# Patient Record
Sex: Female | Born: 1995 | Race: Black or African American | Hispanic: No | Marital: Married | Smoking: Current some day smoker
Health system: Southern US, Community
[De-identification: ages and names within clinical notes are randomized; demographics above are authoritative.]

## PROBLEM LIST (undated history)

## (undated) ENCOUNTER — Inpatient Hospital Stay (HOSPITAL_COMMUNITY): Payer: Self-pay

## (undated) DIAGNOSIS — Z5189 Encounter for other specified aftercare: Secondary | ICD-10-CM

## (undated) DIAGNOSIS — A749 Chlamydial infection, unspecified: Secondary | ICD-10-CM

## (undated) DIAGNOSIS — O1495 Unspecified pre-eclampsia, complicating the puerperium: Secondary | ICD-10-CM

## (undated) DIAGNOSIS — Z8759 Personal history of other complications of pregnancy, childbirth and the puerperium: Secondary | ICD-10-CM

## (undated) DIAGNOSIS — B009 Herpesviral infection, unspecified: Secondary | ICD-10-CM

## (undated) HISTORY — PX: TONSILLECTOMY AND ADENOIDECTOMY: SUR1326

---
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
## 2014-01-03 ENCOUNTER — Encounter: Payer: Self-pay | Admitting: *Deleted

## 2014-01-04 ENCOUNTER — Encounter: Payer: Self-pay | Admitting: Obstetrics & Gynecology

## 2014-02-04 ENCOUNTER — Encounter (HOSPITAL_COMMUNITY): Payer: Self-pay | Admitting: Emergency Medicine

## 2014-02-04 ENCOUNTER — Emergency Department (HOSPITAL_COMMUNITY)
Admission: EM | Admit: 2014-02-04 | Discharge: 2014-02-04 | Disposition: A | Discharge status: Home / Self Care | Attending: Emergency Medicine | Admitting: Emergency Medicine

## 2014-02-04 DIAGNOSIS — Z8619 Personal history of other infectious and parasitic diseases: Secondary | ICD-10-CM | POA: Diagnosis not present

## 2014-02-04 DIAGNOSIS — Z791 Long term (current) use of non-steroidal anti-inflammatories (NSAID): Secondary | ICD-10-CM | POA: Insufficient documentation

## 2014-02-04 DIAGNOSIS — Z793 Long term (current) use of hormonal contraceptives: Secondary | ICD-10-CM | POA: Insufficient documentation

## 2014-02-04 DIAGNOSIS — Z880 Allergy status to penicillin: Secondary | ICD-10-CM | POA: Insufficient documentation

## 2014-02-04 DIAGNOSIS — Y9289 Other specified places as the place of occurrence of the external cause: Secondary | ICD-10-CM | POA: Insufficient documentation

## 2014-02-04 DIAGNOSIS — J45909 Unspecified asthma, uncomplicated: Secondary | ICD-10-CM | POA: Insufficient documentation

## 2014-02-04 DIAGNOSIS — Z87891 Personal history of nicotine dependence: Secondary | ICD-10-CM | POA: Insufficient documentation

## 2014-02-04 DIAGNOSIS — Z7951 Long term (current) use of inhaled steroids: Secondary | ICD-10-CM | POA: Diagnosis not present

## 2014-02-04 DIAGNOSIS — S0993XA Unspecified injury of face, initial encounter: Secondary | ICD-10-CM | POA: Diagnosis not present

## 2014-02-04 DIAGNOSIS — Y9389 Activity, other specified: Secondary | ICD-10-CM | POA: Diagnosis not present

## 2014-02-04 DIAGNOSIS — X58XXXA Exposure to other specified factors, initial encounter: Secondary | ICD-10-CM | POA: Insufficient documentation

## 2014-02-04 DIAGNOSIS — Y998 Other external cause status: Secondary | ICD-10-CM | POA: Insufficient documentation

## 2014-02-04 NOTE — ED Provider Notes (Signed)
CSN: [id number]     Arrival date & time 02/04/14  0753 History   First MD Initiated Contact with Patient 02/04/14 [phone]     Chief Complaint  Patient presents with  . Mouth Injury     (Consider location/radiation/quality/duration/timing/severity/associated sxs/prior Treatment) The history is provided by the patient. No language interpreter was used.  Cindy Pearson is a 19 year old female with past medical history of herpes, chlamydia, asthma presenting to the emergency department with wounds to the inner cheek that occurred approximately 2 days ago. Patient reported that she was playing with her friends and that she almost was about to fall, but broke her fall by left cheek with her hand. Stated that when she did this it resulted in wounds to her inner aspect of her cheek. Reported that she had little cuts there that are now hurting. Stated that they sting when she touches her wounds with her tongue. Stated that she has been gargling with warm water and salt. Stated that she has increased pain especially when swallowing and when her tongue touches the cheek. Denied head injury, loss of conscious, bleeding, pus drainage, neck pain, neck stiffness, dental damage. PCP [facility]  Past Medical History  Diagnosis Date  . Herpes     last outbreak 3 months ago  . Chlamydia 2012    hasn't picked up rx, not treated yet  . Asthma     Albuterol INH prn   Past Surgical History  Procedure Laterality Date  . Cesarean section    . Tonsillectomy and adenoidectomy     Family History  Problem Relation Age of Onset  . Diabetes Maternal Grandmother   . Heart disease Maternal Grandfather   . Heart disease Paternal Grandfather    History  Substance Use Topics  . Smoking status: Former Games developer  . Smokeless tobacco: Never Used  . Alcohol Use: No   OB History    Gravida Para Term Preterm AB TAB SAB Ectopic Multiple Living   0 1 0 1 0 0 2     Review of Systems  Constitutional: Negative for  fever and chills.  HENT: Positive for mouth sores. Negative for dental problem, facial swelling, sore throat and trouble swallowing.   Musculoskeletal: Negative for neck pain and neck stiffness.      Allergies  Penicillins  Home Medications   Prior to Admission medications   Medication Sig Start Date End Date Taking? Authorizing Provider  albuterol (PROVENTIL HFA;VENTOLIN HFA) 108 (90 BASE) MCG/ACT inhaler Inhale 2 puffs into the lungs every 6 (six) hours as needed for wheezing or shortness of breath.    Historical Provider, MD  beclomethasone (QVAR) 80 MCG/ACT inhaler Inhale 2 puffs into the lungs 2 (two) times daily.    Historical Provider, MD  cyclobenzaprine (FLEXERIL) 10 MG tablet Take 1 tablet (10 mg total) by mouth 2 (two) times daily as needed for muscle spasms. 12/22/13   Harle Battiest, NP  ibuprofen (ADVIL,MOTRIN) 800 MG tablet Take 800 mg by mouth every 6 (six) hours as needed for pain.    Historical Provider, MD  medroxyPROGESTERone (DEPO-PROVERA) 150 MG/ML injection Inject 1 mL (150 mg total) into the muscle once. 10/07/13   Antionette Char, MD  naproxen (NAPROSYN) 500 MG tablet Take 1 tablet (500 mg total) by mouth 2 (two) times daily with a meal. 12/22/13   Harle Battiest, NP   BP 112/65 mmHg  Pulse 88  Temp(Src) 97.9 F (36.6 C) (Oral)  Resp 20  Ht  5\' 1"  (1.549 m)  Wt 160 lb (72.576 kg)  BMI 30.25 kg/m2  SpO2 98% Physical Exam  Constitutional: She is oriented to person, place, and time. She appears well-developed and well-nourished. No distress.  HENT:  Head: Normocephalic and atraumatic.  Negative facial swelling 2 areas of circular healing wounds identified to the upper aspect of the left buccal mucosa near the left maxillary canine and one circular healing wound identified to the left buccal mucosa near the mandibular left second premolar. Negative active drainage or bleeding noted. Negative swelling. Mild erythema identified surrounding the  area-appears to be halo in nature. Negative trismus.  Eyes: Conjunctivae and EOM are normal. Pupils are equal, round, and reactive to light. Right eye exhibits no discharge. Left eye exhibits no discharge.  Neck: Normal range of motion. Neck supple. No tracheal deviation present.  Negative neck stiffness Negative nuchal rigidity  Negative cervical lymphadenopathy   Cardiovascular: Normal rate, regular rhythm and normal heart sounds.  Exam reveals no friction rub.   No murmur heard. Pulses:      Radial pulses are 2+ on the right side, and 2+ on the left side.  Pulmonary/Chest: Effort normal and breath sounds normal. No respiratory distress. She has no wheezes. She has no rales.  Patient is able to speak in full sentences without difficulty  Negative use of accessory muscles Negative stridor  Musculoskeletal: Normal range of motion.  Lymphadenopathy:    She has no cervical adenopathy.  Neurological: She is alert and oriented to person, place, and time. No cranial nerve deficit. She exhibits normal muscle tone. Coordination normal.  Skin: Skin is warm and dry. No rash noted. She is not diaphoretic. No erythema.  Psychiatric: She has a normal mood and affect. Her behavior is normal. Thought content normal.  Nursing note and vitals reviewed.   ED Course  Procedures (including critical care time) Labs Review Labs Reviewed - No data to display  Imaging Review No results found.   EKG Interpretation None      MDM   Final diagnoses:  Mouth injury, initial encounter    Medications - No data to display Filed Vitals:   02/04/14 0757 02/04/14 0803  BP: 112/65   Pulse: 88   Temp: 97.9 F (36.6 C)   TempSrc: Oral   Resp: 20   Height:  5\' 1"  (1.549 m)  Weight:  160 lb (72.576 kg)  SpO2: 98%    Patient presenting to emergency department with healed over wounds to the buccal mucosa the on the left side that occurred approximately 2 days ago. This appears to be bite marks that now  healed over when patient fell. Negative signs of infection-wounds appeared to be healing well. Denied head injury or loss of consciousness. Negative focal neurological deficits. Negative active drainage or bleeding noted to the site. Patient stable, afebrile. Patient not septic appearing. Discharged patient. Discharged patient with recommendation to warm water and salt. Discussed with patient to keep mouth clean. Discussed with patient to continue to apply ice. Referred patient to PCP. Discussed with patient to closely monitor symptoms and if symptoms are to worsen or change to report back to the ED - strict return instructions given.  Patient agreed to plan of care, understood, all questions answered.   Raymon MuttonMarissa Nancey Kreitz, PA-C 02/04/14 0857  Raymon MuttonMarissa Xochilth Standish, PA-C 02/04/14 0900  Gilda Creasehristopher J. Pollina, MD 02/04/14 [phone]

## 2014-02-04 NOTE — ED Notes (Signed)
Patient states she bit the inside of her L cheek x 2 days ago and "have holes in it now".   Patient states no bleeding, but she has swelling from the bite on the inside of the mouth.  Patient denies other symptoms.

## 2014-02-04 NOTE — Discharge Instructions (Signed)
Please call your doctor for a followup appointment within 24-48 hours. When you talk to your doctor please let them know that you were seen in the emergency department and have them acquire all of your records so that they can discuss the findings with you and formulate a treatment plan to fully care for your new and ongoing problems. Please follow-up with her primary care provider Please gargle with warm water and salt or diluted mouthwash Please keep teeth cleaned by flossing and brushing daily Please apply ice Please continue to monitor symptoms closely and if symptoms are to worsen or change (fever greater than 101, chills, sweating, nausea, vomiting, chest pain, shortness of breathe, difficulty breathing, weakness, numbness, tingling, worsening or changes to pain pattern, swelling, drainage, increased pain, inability to swallow, inability to open and close the jaw line) please report back to the Emergency Department immediately.   Mouth Laceration A mouth laceration is a cut inside the mouth.  HOME CARE  Rinse your mouth with warm salt water 4 to 6 times a day.  Brush your teeth as usual if you can.  Do not eat hot food or have hot drinks while your mouth is still numb.  Avoid acidic foods or other foods that bother your cut.  Only take medicine as told by your doctor.  Keep all doctor visits as told.  If there are stitches (sutures) in the mouth, do not play with them with your tongue. You may need a tetanus shot if:  You cannot remember when you had your last tetanus shot.  You have never had a tetanus shot. If you need a tetanus shot and you choose not to have one, you may get tetanus. Sickness from tetanus can be serious. GET HELP RIGHT AWAY IF:   Your cut or other parts of your face are puffy (swollen) or painful.  You have a fever.  Your throat is puffy or tender.  Your cut breaks open after stitches have been removed.  You see yellowish-white fluid (pus) coming  from the cut. MAKE SURE YOU:   Understand these instructions.  Will watch your condition.  Will get help right away if you are not doing well or get worse. Document Released: 06/12/2007 Document Revised: 05/10/2013 Document Reviewed: 06/28/2010 [facility] Patient Information 2015 [place], [place]. This information is not intended to replace advice given to you by your health care provider. Make sure you discuss any questions you have with your health care provider.

## 2014-03-09 ENCOUNTER — Ambulatory Visit: Admitting: Obstetrics

## 2014-07-29 ENCOUNTER — Emergency Department (HOSPITAL_COMMUNITY)
Admission: EM | Admit: 2014-07-29 | Discharge: 2014-07-29 | Disposition: A | Discharge status: Home / Self Care | Attending: Emergency Medicine | Admitting: Emergency Medicine

## 2014-07-29 ENCOUNTER — Encounter (HOSPITAL_COMMUNITY): Payer: Self-pay | Admitting: Family Medicine

## 2014-07-29 DIAGNOSIS — L02211 Cutaneous abscess of abdominal wall: Secondary | ICD-10-CM | POA: Diagnosis not present

## 2014-07-29 DIAGNOSIS — Z7951 Long term (current) use of inhaled steroids: Secondary | ICD-10-CM | POA: Insufficient documentation

## 2014-07-29 DIAGNOSIS — L0291 Cutaneous abscess, unspecified: Secondary | ICD-10-CM

## 2014-07-29 DIAGNOSIS — Z793 Long term (current) use of hormonal contraceptives: Secondary | ICD-10-CM | POA: Diagnosis not present

## 2014-07-29 DIAGNOSIS — J45909 Unspecified asthma, uncomplicated: Secondary | ICD-10-CM | POA: Diagnosis not present

## 2014-07-29 DIAGNOSIS — Z8619 Personal history of other infectious and parasitic diseases: Secondary | ICD-10-CM | POA: Insufficient documentation

## 2014-07-29 DIAGNOSIS — Z87891 Personal history of nicotine dependence: Secondary | ICD-10-CM | POA: Diagnosis not present

## 2014-07-29 DIAGNOSIS — Z79899 Other long term (current) drug therapy: Secondary | ICD-10-CM | POA: Insufficient documentation

## 2014-07-29 DIAGNOSIS — Z880 Allergy status to penicillin: Secondary | ICD-10-CM | POA: Diagnosis not present

## 2014-07-29 MED ORDER — NAPROXEN 500 MG PO TBEC: 500.0000 mg | DELAYED_RELEASE_TABLET | Freq: Two times a day (BID) | ORAL | Status: DC

## 2014-07-29 MED ORDER — SULFAMETHOXAZOLE-TRIMETHOPRIM 800-160 MG PO TABS
1.0000 | ORAL_TABLET | Freq: Two times a day (BID) | ORAL | Status: AC
Start: 2014-07-29 — End: 2014-08-05

## 2014-07-29 NOTE — ED Notes (Signed)
Declined W/C at D/C and was escorted to lobby by RN. 

## 2014-07-29 NOTE — Discharge Instructions (Signed)
Take the prescribed medication as directed. °Follow-up with your primary care physician. °Return to the ED for new or worsening symptoms. ° °

## 2014-07-29 NOTE — ED Notes (Signed)
Pt here for abd pain and possible MRSA infection. sts daughter was dx with MRSA. sts pain is where the abscess is.

## 2014-07-29 NOTE — ED Provider Notes (Signed)
CSN: [id number]     Arrival date & time 07/29/14  1015 History  This chart was scribed for non-physician practitioner, Garlon Hatchet, PA-C working with Doug Sou, MD by Placido Sou, ED scribe. This patient was seen in room TR08C/TR08C and the patient's care was started at 10:43 AM.  Chief Complaint  Patient presents with  . Abscess   The history is provided by the patient. No language interpreter was used.    HPI Comments: Cindy Pearson is a 19 y.o. female, with a history of herpes, who presents to the Emergency Department complaining of two points of swelling to her lower abd and right abd with onset a few days ago. Pt notes associated pain and irritation to the affected regions. She notes a hx of abscesses but never to her abd region. She further notes that her daughter was recently diagnosed with MRSA. Pt denies fever or chills.   No hx of HIV, DM.  Past Medical History  Diagnosis Date  . Herpes     last outbreak 3 months ago  . Chlamydia 2012    hasn't picked up rx, not treated yet  . Asthma     Albuterol INH prn   Past Surgical History  Procedure Laterality Date  . Cesarean section    . Tonsillectomy and adenoidectomy     Family History  Problem Relation Age of Onset  . Diabetes Maternal Grandmother   . Heart disease Maternal Grandfather   . Heart disease Paternal Grandfather    History  Substance Use Topics  . Smoking status: Former Games developer  . Smokeless tobacco: Never Used  . Alcohol Use: No   OB History    Gravida Para Term Preterm AB TAB SAB Ectopic Multiple Living   3 2 2  0 1 0 1 0 0 2     Review of Systems  Constitutional: Negative for fever and chills.  Gastrointestinal: Positive for abdominal pain (abscess).  All other systems reviewed and are negative.   Allergies  Penicillins  Home Medications   Prior to Admission medications   Medication Sig Start Date End Date Taking? Authorizing Provider  albuterol (PROVENTIL HFA;VENTOLIN HFA) 108 (90  BASE) MCG/ACT inhaler Inhale 2 puffs into the lungs every 6 (six) hours as needed for wheezing or shortness of breath.    Historical Provider, MD  beclomethasone (QVAR) 80 MCG/ACT inhaler Inhale 2 puffs into the lungs 2 (two) times daily.    Historical Provider, MD  cyclobenzaprine (FLEXERIL) 10 MG tablet Take 1 tablet (10 mg total) by mouth 2 (two) times daily as needed for muscle spasms. 12/22/13   Harle Battiest, NP  ibuprofen (ADVIL,MOTRIN) 800 MG tablet Take 800 mg by mouth every 6 (six) hours as needed for pain.    Historical Provider, MD  medroxyPROGESTERone (DEPO-PROVERA) 150 MG/ML injection Inject 1 mL (150 mg total) into the muscle once. 10/07/13   Antionette Char, MD  naproxen (NAPROSYN) 500 MG tablet Take 1 tablet (500 mg total) by mouth 2 (two) times daily with a meal. 12/22/13   Harle Battiest, NP   BP 108/65 mmHg  Pulse 78  Temp(Src) 98 F (36.7 C) (Oral)  Resp 16  SpO2 98%   Physical Exam  Constitutional: She is oriented to person, place, and time. She appears well-developed and well-nourished. No distress.  HENT:  Head: Normocephalic and atraumatic.  Mouth/Throat: Oropharynx is clear and moist.  Eyes: Conjunctivae and EOM are normal. Pupils are equal, round, and reactive to light.  Neck: Normal  range of motion. Neck supple.  Cardiovascular: Normal rate, regular rhythm and normal heart sounds.   Pulmonary/Chest: Effort normal and breath sounds normal. No respiratory distress. She has no wheezes.  Abdominal: Soft. Bowel sounds are normal.  2 small boils noted to right lateral abdomen and just below navel; no central fluctuance or drainage; no surrounding cellulitis or induration  Musculoskeletal: Normal range of motion.  Neurological: She is alert and oriented to person, place, and time.  Skin: Skin is warm and dry. She is not diaphoretic.  Psychiatric: She has a normal mood and affect.  Nursing note and vitals reviewed.  ED Course  Procedures  DIAGNOSTIC  STUDIES: Oxygen Saturation is 98% on RA, normal by my interpretation.    COORDINATION OF CARE: 10:45 AM Discussed treatment plan with pt at bedside and pt agreed to plan.  Labs Review Labs Reviewed - No data to display  Imaging Review No results found.   EKG Interpretation None     MDM   Final diagnoses:  Abscess   19 year old female with 2 small boils, one of right lateral abdomen and the other of lower abdomen just below navel. There is no central fluctuance or drainage. No significant cellulitis or induration noted. Vital signs stable, patient nontoxic in appearance. Abscesses do not appear amenable to drainage at this time. Will start on antibiotics, encouraged warm compresses. Patient is to follow-up with her PCP.  Discussed plan with patient, he/she acknowledged understanding and agreed with plan of care.  Return precautions given for new or worsening symptoms.  I personally performed the services described in this documentation, which was scribed in my presence. The recorded information has been reviewed and is accurate.  Garlon Hatchet, PA-C 07/29/14 1204  Doug Sou, MD 07/29/14 1538

## 2014-12-29 ENCOUNTER — Emergency Department (HOSPITAL_COMMUNITY)
Admission: EM | Admit: 2014-12-29 | Discharge: 2014-12-29 | Disposition: A | Discharge status: Home / Self Care | Attending: Emergency Medicine | Admitting: Emergency Medicine

## 2014-12-29 ENCOUNTER — Encounter (HOSPITAL_COMMUNITY): Payer: Self-pay

## 2014-12-29 DIAGNOSIS — L0291 Cutaneous abscess, unspecified: Secondary | ICD-10-CM

## 2014-12-29 DIAGNOSIS — Z880 Allergy status to penicillin: Secondary | ICD-10-CM | POA: Diagnosis not present

## 2014-12-29 DIAGNOSIS — L03113 Cellulitis of right upper limb: Secondary | ICD-10-CM | POA: Diagnosis not present

## 2014-12-29 DIAGNOSIS — J45909 Unspecified asthma, uncomplicated: Secondary | ICD-10-CM | POA: Diagnosis not present

## 2014-12-29 DIAGNOSIS — Z79899 Other long term (current) drug therapy: Secondary | ICD-10-CM | POA: Diagnosis not present

## 2014-12-29 DIAGNOSIS — Z87891 Personal history of nicotine dependence: Secondary | ICD-10-CM | POA: Insufficient documentation

## 2014-12-29 DIAGNOSIS — Z7951 Long term (current) use of inhaled steroids: Secondary | ICD-10-CM | POA: Insufficient documentation

## 2014-12-29 DIAGNOSIS — Z8619 Personal history of other infectious and parasitic diseases: Secondary | ICD-10-CM | POA: Insufficient documentation

## 2014-12-29 DIAGNOSIS — L02413 Cutaneous abscess of right upper limb: Secondary | ICD-10-CM | POA: Diagnosis present

## 2014-12-29 DIAGNOSIS — L0390 Cellulitis, unspecified: Secondary | ICD-10-CM

## 2014-12-29 DIAGNOSIS — Z791 Long term (current) use of non-steroidal anti-inflammatories (NSAID): Secondary | ICD-10-CM | POA: Insufficient documentation

## 2014-12-29 LAB — CBG MONITORING, ED: Glucose-Capillary: 67 mg/dL (ref 65–99)

## 2014-12-29 MED ORDER — IBUPROFEN 800 MG PO TABS: 800.0000 mg | ORAL_TABLET | Freq: Three times a day (TID) | ORAL | Status: DC

## 2014-12-29 MED ORDER — LIDOCAINE HCL (PF) 1 % IJ SOLN
5.0000 mL | Freq: Once | INTRAMUSCULAR | Status: AC
  Administered 2014-12-29: 5 mL via INTRADERMAL
  Filled 2014-12-29: qty 5

## 2014-12-29 MED ORDER — IBUPROFEN 400 MG PO TABS
800.0000 mg | ORAL_TABLET | Freq: Once | ORAL | Status: AC
  Administered 2014-12-29: 800 mg via ORAL
  Filled 2014-12-29: qty 2

## 2014-12-29 MED ORDER — CLINDAMYCIN HCL 150 MG PO CAPS: 450.0000 mg | ORAL_CAPSULE | Freq: Three times a day (TID) | ORAL | Status: DC

## 2014-12-29 NOTE — ED Notes (Signed)
Agree with CPA assessment. 

## 2014-12-29 NOTE — Discharge Instructions (Signed)
Abscess °An abscess is an infected area that contains a collection of pus and debris. It can occur in almost any part of the body. An abscess is also known as a furuncle or boil. °CAUSES  °An abscess occurs when tissue gets infected. This can occur from blockage of oil or sweat glands, infection of hair follicles, or a minor injury to the skin. As the body tries to fight the infection, pus collects in the area and creates pressure under the skin. This pressure causes pain. People with weakened immune systems have difficulty fighting infections and get certain abscesses more often.  °SYMPTOMS °Usually an abscess develops on the skin and becomes a painful mass that is red, warm, and tender. If the abscess forms under the skin, you may feel a moveable soft area under the skin. Some abscesses break open (rupture) on their own, but most will continue to get worse without care. The infection can spread deeper into the body and eventually into the bloodstream, causing you to feel ill.  °DIAGNOSIS  °Your caregiver will take your medical history and perform a physical exam. A sample of fluid may also be taken from the abscess to determine what is causing your infection. °TREATMENT  °Your caregiver may prescribe antibiotic medicines to fight the infection. However, taking antibiotics alone usually does not cure an abscess. Your caregiver may need to make a small cut (incision) in the abscess to drain the pus. In some cases, gauze is packed into the abscess to reduce pain and to continue draining the area. °HOME CARE INSTRUCTIONS  °· Only take over-the-counter or prescription medicines for pain, discomfort, or fever as directed by your caregiver. °· If you were prescribed antibiotics, take them as directed. Finish them even if you start to feel better. °· If gauze is used, follow your caregiver's directions for changing the gauze. °· To avoid spreading the infection: °· Keep your draining abscess covered with a  bandage. °· Wash your hands well. °· Do not share personal care items, towels, or whirlpools with others. °· Avoid skin contact with others. °· Keep your skin and clothes clean around the abscess. °· Keep all follow-up appointments as directed by your caregiver. °SEEK MEDICAL CARE IF:  °· You have increased pain, swelling, redness, fluid drainage, or bleeding. °· You have muscle aches, chills, or a general ill feeling. °· You have a fever. °MAKE SURE YOU:  °· Understand these instructions. °· Will watch your condition. °· Will get help right away if you are not doing well or get worse. °  °This information is not intended to replace advice given to you by your health care provider. Make sure you discuss any questions you have with your health care provider. °  °Document Released: 10/03/2004 Document Revised: 06/25/2011 Document Reviewed: 03/08/2011 °Elsevier Interactive Patient Education ©2016 Else[employer] Inc. ° °Incision and Drainage °Incision and drainage is a procedure in which a sac-like structure (cystic structure) is opened and drained. The area to be drained usually contains material such as pus, fluid, or blood.  °LET YOUR CAREGIVER KNOW ABOUT:  °· Allergies to medicine. °· Medicines taken, including vitamins, herbs, eyedrops, over-the-counter medicines, and creams. °· Use of steroids (by mouth or creams). °· Previous problems with anesthetics or numbing medicines. °· History of bleeding problems or blood clots. °· Previous surgery. °· Other health problems, including diabetes and kidney problems. °· Possibility of pregnancy, if this applies. °RISKS AND COMPLICATIONS °· Pain. °· Bleeding. °· Scarring. °· Infection. °BEFORE THE PROCEDURE  °  You may need to have an ultrasound or other imaging tests to see how large or deep your cystic structure is. Blood tests may also be used to determine if you have an infection or how severe the infection is. You may need to have a tetanus shot. PROCEDURE  The affected area  is cleaned with a cleaning fluid. The cyst area will then be numbed with a medicine (local anesthetic). A small incision will be made in the cystic structure. A syringe or catheter may be used to drain the contents of the cystic structure, or the contents may be squeezed out. The area will then be flushed with a cleansing solution. After cleansing the area, it is often gently packed with a gauze or another wound dressing. Once it is packed, it will be covered with gauze and tape or some other type of wound dressing. AFTER THE PROCEDURE   Often, you will be allowed to go home right after the procedure.  You may be given antibiotic medicine to prevent or heal an infection.  If the area was packed with gauze or some other wound dressing, you will likely need to come back in 1 to 2 days to get it removed.  The area should heal in about 14 days.   This information is not intended to replace advice given to you by your health care provider. Make sure you discuss any questions you have with your health care provider.   Document Released: 06/19/2000 Document Revised: 06/25/2011 Document Reviewed: 02/18/2011 Elsevier Interactive Patient Education 2016 Elsevier Inc.  Cellulitis Cellulitis is an infection of the skin and the tissue beneath it. The infected area is usually red and tender. Cellulitis occurs most often in the arms and lower legs.  CAUSES  Cellulitis is caused by bacteria that enter the skin through cracks or cuts in the skin. The most common types of bacteria that cause cellulitis are staphylococci and streptococci. SIGNS AND SYMPTOMS   Redness and warmth.  Swelling.  Tenderness or pain.  Fever. DIAGNOSIS  Your health care provider can usually determine what is wrong based on a physical exam. Blood tests may also be done. TREATMENT  Treatment usually involves taking an antibiotic medicine. HOME CARE INSTRUCTIONS   Take your antibiotic medicine as directed by your health care  provider. Finish the antibiotic even if you start to feel better.  Keep the infected arm or leg elevated to reduce swelling.  Apply a warm cloth to the affected area up to 4 times per day to relieve pain.  Take medicines only as directed by your health care provider.  Keep all follow-up visits as directed by your health care provider. SEEK MEDICAL CARE IF:   You notice red streaks coming from the infected area.  Your red area gets larger or turns dark in color.  Your bone or joint underneath the infected area becomes painful after the skin has healed.  Your infection returns in the same area or another area.  You notice a swollen bump in the infected area.  You develop new symptoms.  You have a fever. SEEK IMMEDIATE MEDICAL CARE IF:   You feel very sleepy.  You develop vomiting or diarrhea.  You have a general ill feeling (malaise) with muscle aches and pains.   This information is not intended to replace advice given to you by your health care provider. Make sure you discuss any questions you have with your health care provider.   Document Released: 10/03/2004 Document Revised: 09/14/2014 Document  Reviewed: 03/11/2011 Elsevier Interactive Patient Education [employer].

## 2014-12-29 NOTE — ED Notes (Signed)
Pt reports abscess to right elbow that has been present x1 week.  Pt reports small amounts of white drainage from area.  Pt reports pain upon palpation to abscess and surrounding areas.

## 2014-12-29 NOTE — ED Provider Notes (Signed)
CSN: [id number]     Arrival date & time 12/29/14  1545 History  By signing my name below, I, Cindy Pearson, attest that this documentation has been prepared under the direction and in the presence of [name], PA-C. Electronically Signed: Phillis Pearson, ED Scribe. 12/29/2014. 4:36 PM.  Chief Complaint  Patient presents with  . Abscess   The history is provided by the patient. No language interpreter was used.   HPI Comments: Cindy Pearson is a 19 y.o. female who presents to the Emergency Department complaining of a constant gradually worsening, moderately painful abscess near the right elbow onset one week ago. Pt reports 7/10 pain radiating around her elbow area with slight green drainage. She reports hx of abscesses on other parts of her body, but states that they have drained on their own. She reports worsening pain with palpation and an intermittent sharp, tingling, sensation. She denies taking anything for her symptoms. She denies hx of diabetes, fever, chills, nausea, vomiting, numbness, or weakness of the upper extremities. Pt is allergic to penicillin. She does not know the exact date of her last tdap, but states that she should not be due for another one.  Past Medical History  Diagnosis Date  . Herpes     last outbreak 3 months ago  . Chlamydia 2012    hasn't picked up rx, not treated yet  . Asthma     Albuterol INH prn   Past Surgical History  Procedure Laterality Date  . Cesarean section    . Tonsillectomy and adenoidectomy     Family History  Problem Relation Age of Onset  . Diabetes Maternal Grandmother   . Heart disease Maternal Grandfather   . Heart disease Paternal Grandfather    Social History  Substance Use Topics  . Smoking status: Former Games developer  . Smokeless tobacco: Never Used  . Alcohol Use: No   OB History    Gravida Para Term Preterm AB TAB SAB Ectopic Multiple Living   0 1 0 1 0 0 2     Review of Systems 10 Systems reviewed and all are  negative for acute change except as noted in the HPI.  Allergies  Penicillins  Home Medications   Prior to Admission medications   Medication Sig Start Date End Date Taking? Authorizing Provider  albuterol (PROVENTIL HFA;VENTOLIN HFA) 108 (90 BASE) MCG/ACT inhaler Inhale 2 puffs into the lungs every 6 (six) hours as needed for wheezing or shortness of breath.    Historical Provider, MD  beclomethasone (QVAR) 80 MCG/ACT inhaler Inhale 2 puffs into the lungs 2 (two) times daily.    Historical Provider, MD  clindamycin (CLEOCIN) 150 MG capsule Take 3 capsules (450 mg total) by mouth 3 (three) times daily. 12/29/14   [name], PA-C  cyclobenzaprine (FLEXERIL) 10 MG tablet Take 1 tablet (10 mg total) by mouth 2 (two) times daily as needed for muscle spasms. 12/22/13   Harle Battiest, NP  ibuprofen (ADVIL,MOTRIN) 800 MG tablet Take 1 tablet (800 mg total) by mouth 3 (three) times daily. 12/29/14   [name], PA-C  medroxyPROGESTERone (DEPO-PROVERA) 150 MG/ML injection Inject 1 mL (150 mg total) into the muscle once. 10/07/13   Antionette Char, MD  naproxen (EC-NAPROSYN) 500 MG EC tablet Take 1 tablet (500 mg total) by mouth 2 (two) times daily with a meal. 07/29/14   Garlon Hatchet, PA-C   BP 114/73 mmHg  Pulse 89  Temp(Src) 97.9 F (36.6 C) (Oral)  Resp 20  Ht 5\' 1"  (1.549 m)  Wt 61.236 kg  BMI 25.52 kg/m2  SpO2 99% Physical Exam  Constitutional: She is oriented to person, place, and time. She appears well-developed and well-nourished.  HENT:  Head: Atraumatic.  Eyes: Conjunctivae are normal. No scleral icterus.  Neck: No tracheal deviation present.  Pulmonary/Chest: Effort normal. No respiratory distress.  Musculoskeletal: Normal range of motion.  Neurological: She is alert and oriented to person, place, and time.  Skin: Skin is warm and dry. There is erythema.  1x1cm area with minimal fluctuance with medium area of surrounding erythema, warmth, and induration.  No active  drainage.  Area is swollen.  Psychiatric: She has a normal mood and affect. Her behavior is normal.    ED Course  Procedures (including critical care time) DIAGNOSTIC STUDIES: Oxygen Saturation is 99% on RA, normal by my interpretation.    COORDINATION OF CARE: 4:34 PM-Discussed treatment plan which includes I&D and anti-biotics with pt at bedside and pt agreed to plan.   INCISION AND DRAINAGE Performed by: Cheri FowlerKayla Riata Ikeda Consent: Verbal consent obtained. Risks and benefits: risks, benefits and alternatives were discussed Type: abscess  Body area: right elbow  Anesthesia: local infiltration  Incision was made with a scalpel.  Local anesthetic: lidocaine 1% without epinephrine  Anesthetic total: 1 ml  Complexity: complex Blunt dissection to break up loculations  Drainage: purulent  Drainage amount: small  Packing material: left open  Patient tolerance: Patient tolerated the procedure well with no immediate complications.      Labs Review Labs Reviewed  CBG MONITORING, ED    Imaging Review No results found. I have personally reviewed and evaluated these images and lab results as part of my medical decision-making.   EKG Interpretation None      MDM   Final diagnoses:  Abscess and cellulitis    Patient presents with 1 week history of worsening abscess to her right arm.  Hx of abscess that have resolved spontaneously with drainage.  No fevers, N/V, or systemic symptoms.  VSS, NAD.  On exam, area of mimimal fluctuance with surrounding erythema, induration, warmth consistent with cellulitis.  Discussed conservative treatment vs I&D.  Patient would like I&D performed.  Will d/c home with clindamycin and motrin and patient instructed to return to ED in 2 days for wound check.  Evaluation does not show pathology requring ongoing emergent intervention or admission. Pt is hemodynamically stable and mentating appropriately. Discussed findings/results and plan with  patient/guardian, who agrees with plan. All questions answered. Return precautions discussed and outpatient follow up given.    I personally performed the services described in this documentation, which was scribed in my presence. The recorded information has been reviewed and is accurate.    Cheri FowlerKayla Marolyn Urschel, PA-C 12/29/14 1717  Pricilla LovelessScott Goldston, MD 12/30/14 [phone]

## 2015-05-05 ENCOUNTER — Emergency Department (HOSPITAL_COMMUNITY)
Admission: EM | Admit: 2015-05-05 | Discharge: 2015-05-05 | Disposition: A | Discharge status: Home / Self Care | Attending: Emergency Medicine | Admitting: Emergency Medicine

## 2015-05-05 ENCOUNTER — Encounter (HOSPITAL_COMMUNITY): Payer: Self-pay | Admitting: *Deleted

## 2015-05-05 DIAGNOSIS — Z791 Long term (current) use of non-steroidal anti-inflammatories (NSAID): Secondary | ICD-10-CM | POA: Diagnosis not present

## 2015-05-05 DIAGNOSIS — Z792 Long term (current) use of antibiotics: Secondary | ICD-10-CM | POA: Diagnosis not present

## 2015-05-05 DIAGNOSIS — J029 Acute pharyngitis, unspecified: Secondary | ICD-10-CM | POA: Diagnosis not present

## 2015-05-05 DIAGNOSIS — R590 Localized enlarged lymph nodes: Secondary | ICD-10-CM | POA: Insufficient documentation

## 2015-05-05 DIAGNOSIS — Z880 Allergy status to penicillin: Secondary | ICD-10-CM | POA: Insufficient documentation

## 2015-05-05 DIAGNOSIS — Z87891 Personal history of nicotine dependence: Secondary | ICD-10-CM | POA: Insufficient documentation

## 2015-05-05 DIAGNOSIS — J45909 Unspecified asthma, uncomplicated: Secondary | ICD-10-CM | POA: Insufficient documentation

## 2015-05-05 DIAGNOSIS — R591 Generalized enlarged lymph nodes: Secondary | ICD-10-CM

## 2015-05-05 LAB — RAPID STREP SCREEN (MED CTR MEBANE ONLY): STREPTOCOCCUS, GROUP A SCREEN (DIRECT): NEGATIVE

## 2015-05-05 LAB — MONONUCLEOSIS SCREEN: MONO SCREEN: NEGATIVE

## 2015-05-05 MED ORDER — ACETAMINOPHEN 325 MG PO TABS
975.0000 mg | ORAL_TABLET | Freq: Once | ORAL | Status: AC
Start: 2015-05-05 — End: 2015-05-05
  Administered 2015-05-05: 975 mg via ORAL
  Filled 2015-05-05: qty 3

## 2015-05-05 MED ORDER — KETOROLAC TROMETHAMINE 60 MG/2ML IM SOLN
30.0000 mg | Freq: Once | INTRAMUSCULAR | Status: AC
Start: 2015-05-05 — End: 2015-05-05
  Administered 2015-05-05: 30 mg via INTRAMUSCULAR
  Filled 2015-05-05: qty 2

## 2015-05-05 MED ORDER — PREDNISONE 20 MG PO TABS
60.0000 mg | ORAL_TABLET | Freq: Once | ORAL | Status: AC
Start: 2015-05-05 — End: 2015-05-05
  Administered 2015-05-05: 60 mg via ORAL
  Filled 2015-05-05: qty 3

## 2015-05-05 MED ORDER — PREDNISONE 50 MG PO TABS: ORAL_TABLET | ORAL | Status: DC

## 2015-05-05 NOTE — ED Provider Notes (Signed)
CSN: [id number]     Arrival date & time 05/05/15  4098 History  By signing my name below, I, Ronney Lion, attest that this documentation has been prepared under the direction and in the presence of [employer], PA-C. Electronically Signed: Ronney Lion, ED Scribe. 05/05/2015. 9:24 AM.    Chief Complaint  Patient presents with  . Sore Throat   The history is provided by the patient. No language interpreter was used.  HPI Comments: Cindy Pearson is a 20 y.o. female with a history of asthma, brought in by ambulance, who presents to the Emergency Department complaining of a gradual-onset, constant, 10/10, left-sided, sore throat that began 1 week ago. She also complains of associated sinus congestion, otalgia, and swollen lymph nodes. Swallowing exacerbates her pain. Patient states she has not tried any OTC medications or treatments for her symptoms, as she states she does not have the money to go to the store. Patient denies any known sick contact. She denies a prior history of mononucleosis. She denies any known fever, cough, or abdominal pain. Patient denies the possibility of pregnancy. She reports known allergies to penicillin.     Past Medical History  Diagnosis Date  . Herpes     last outbreak 3 months ago  . Chlamydia 2012    hasn't picked up rx, not treated yet  . Asthma     Albuterol INH prn   Past Surgical History  Procedure Laterality Date  . Cesarean section    . Tonsillectomy and adenoidectomy     Family History  Problem Relation Age of Onset  . Diabetes Maternal Grandmother   . Heart disease Maternal Grandfather   . Heart disease Paternal Grandfather    Social History  Substance Use Topics  . Smoking status: Former Games developer  . Smokeless tobacco: Never Used  . Alcohol Use: No   OB History    Gravida Para Term Preterm AB TAB SAB Ectopic Multiple Living   0 1 0 1 0 0 2     Review of Systems A complete 10 system review of systems was obtained and all systems  are negative except as noted in the HPI and PMH.    Allergies  Penicillins  Home Medications   Prior to Admission medications   Medication Sig Start Date End Date Taking? Authorizing Provider  clindamycin (CLEOCIN) 150 MG capsule Take 3 capsules (450 mg total) by mouth 3 (three) times daily. 12/29/14   [name], PA-C  cyclobenzaprine (FLEXERIL) 10 MG tablet Take 1 tablet (10 mg total) by mouth 2 (two) times daily as needed for muscle spasms. Patient not taking: Reported on 12/29/2014 12/22/13   Harle Battiest, NP  ibuprofen (ADVIL,MOTRIN) 800 MG tablet Take 1 tablet (800 mg total) by mouth 3 (three) times daily. 12/29/14   [name], PA-C  medroxyPROGESTERone (DEPO-PROVERA) 150 MG/ML injection Inject 1 mL (150 mg total) into the muscle once. Patient not taking: Reported on 12/29/2014 10/07/13   Antionette Char, MD  naproxen (EC-NAPROSYN) 500 MG EC tablet Take 1 tablet (500 mg total) by mouth 2 (two) times daily with a meal. Patient not taking: Reported on 12/29/2014 07/29/14   Garlon Hatchet, PA-C   BP 114/75 mmHg  Pulse 85  Temp(Src) 98.3 F (36.8 C) (Oral)  Resp 19  Ht  (1.549 m)  Wt 135 lb (61.236 kg)  BMI 25.52 kg/m2  SpO2 100% Physical Exam  Constitutional: She is oriented to person, place, and time. She appears  well-developed and well-nourished. No distress.  HENT:  Head: Normocephalic and atraumatic.  No tonsillar hypertrophy. Patient is swallowing her secretions without issue.   Eyes: Conjunctivae and EOM are normal.  Neck: Neck supple. No tracheal deviation present.  Significant enlarged anterior cervical lymphadenopathy, with exquisite TTP. No posterior cervical lymphadenopathy.  Cardiovascular: Normal rate, regular rhythm and normal heart sounds.  Exam reveals no gallop and no friction rub.   No murmur heard. Pulmonary/Chest: Effort normal. No respiratory distress. She has no wheezes. She has no rales.  Lungs are clear to auscultation.   Musculoskeletal:  Normal range of motion.  Lymphadenopathy:    She has cervical adenopathy.       Right cervical: No posterior cervical adenopathy present.      Left cervical: No posterior cervical adenopathy present.  Neurological: She is alert and oriented to person, place, and time.  Skin: Skin is warm and dry.  Psychiatric: She has a normal mood and affect. Her behavior is normal.  Nursing note and vitals reviewed.   ED Course  Procedures (including critical care time)  DIAGNOSTIC STUDIES: Oxygen Saturation is 100% on RA, normal by my interpretation.    COORDINATION OF CARE: 9:10 AM - Discussed treatment plan with pt at bedside which includes Toradol injection, PO injection, mono screen, rapid strep screen. Pt verbalized understanding and agreed to plan.   Labs Review Labs Reviewed  [facility])  MONONUCLEOSIS SCREEN    MDM   Final diagnoses:  Viral pharyngitis  Lymphadenopathy of head and neck    Filed Vitals:   05/05/15 0825 05/05/15 1125  BP: 114/75 100/61  Pulse: 85 79  Temp: 98.3 F (36.8 C) 98.6 F (37 C)  TempSrc: Oral Oral  Resp: 19 14  Height: 5\' 1"  (1.549 m)   Weight: 61.236 kg   SpO2: 100% 100%    Medications  ketorolac (TORADOL) injection 30 mg (30 mg Intramuscular Given 05/05/15 0939)  acetaminophen (TYLENOL) tablet 975 mg (975 mg Oral Given 05/05/15 0939)  predniSONE (DELTASONE) tablet 60 mg (60 mg Oral Given 05/05/15 0939)    Cindy Pearson is 20 y.o. female presenting with Sore throat with swollen lymph nodes over the course of one week. Patient tolerating her secretions without issues, mildly tender anterior cervical lymphadenopathy. Strep and mono are negative, afebrile with stable vital signs. Likely viral pharyngitis. Patient will be given prescription for prednisone burst, encourage Motrin for pain control. Patient verbalized her understanding.  Evaluation does not show pathology that would require ongoing emergent intervention or  inpatient treatment. Pt is hemodynamically stable and mentating appropriately. Discussed findings and plan with patient/guardian, who agrees with care plan. All questions answered. Return precautions discussed and outpatient follow up given.   Discharge Medication List as of 05/05/2015 11:35 AM    START taking these medications   Details  predniSONE (DELTASONE) 50 MG tablet Take 1 tablet daily with breakfast, Print         I personally performed the services described in this documentation, which was scribed in my presence. The recorded information has been reviewed and is accurate.    Wynetta Emeryicole Siaosi Alter, PA-C 05/05/15 1656  Leta BaptistEmily Roe Nguyen, MD 05/18/15 [phone]

## 2015-05-05 NOTE — ED Notes (Signed)
Pt arrived by [facility] for sore throat and swollen lymph node x 1 week. Airway intact.

## 2015-05-05 NOTE — ED Notes (Signed)
Agree with PA assessment 

## 2015-05-05 NOTE — Discharge Instructions (Signed)
For pain control please take ibuprofen (also known as Motrin or Advil) 800mg  (this is normally 4 over the counter pills) 3 times a day  for 5 days. Take with food to minimize stomach irritation.  Push fluids: take small frequent sips of water or Gatorade, do not drink any soda, juice or caffeinated beverages.    Please follow with your primary care doctor in the next 2 days for a check-up. They must obtain records for further management.   Do not hesitate to return to the Emergency Department for any new, worsening or concerning symptoms.    Lymphadenopathy Lymphadenopathy refers to swollen or enlarged lymph glands, also called lymph nodes. Lymph glands are part of your body's defense (immune) system, which protects the body from infections, germs, and diseases. Lymph glands are found in many locations in your body, including the neck, underarm, and groin.  Many things can cause lymph glands to become enlarged. When your immune system responds to germs, such as viruses or bacteria, infection-fighting cells and fluid build up. This causes the glands to grow in size. Usually, this is not something to worry about. The swelling and any soreness often go away without treatment. However, swollen lymph glands can also be caused by a number of diseases. Your health care provider may do various tests to help determine the cause. If the cause of your swollen lymph glands cannot be found, it is important to monitor your condition to make sure the swelling goes away. HOME CARE INSTRUCTIONS Watch your condition for any changes. The following actions may help to lessen any discomfort you are feeling:  Get plenty of rest.  Take medicines only as directed by your health care provider. Your health care provider may recommend over-the-counter medicines for pain.  Apply moist heat compresses to the site of swollen lymph nodes as directed by your health care provider. This can help reduce any pain.  Check your  lymph nodes daily for any changes.  Keep all follow-up visits as directed by your health care provider. This is important. SEEK MEDICAL CARE IF:  Your lymph nodes are still swollen after 2 weeks.  Your swelling increases or spreads to other areas.  Your lymph nodes are hard, seem fixed to the skin, or are growing rapidly.  Your skin over the lymph nodes is red and inflamed.  You have a fever.  You have chills.  You have fatigue.  You develop a sore throat.  You have abdominal pain.  You have weight loss.  You have night sweats. SEEK IMMEDIATE MEDICAL CARE IF:  You notice fluid leaking from the area of the enlarged lymph node.  You have severe pain in any area of your body.  You have chest pain.  You have shortness of breath.   This information is not intended to replace advice given to you by your health care provider. Make sure you discuss any questions you have with your health care provider.   Document Released: 10/03/2007 Document Revised: 01/14/2014 Document Reviewed: 07/29/2013 Elsevier Interactive Patient Education 2016 [employer]  Pharyngitis Pharyngitis is a sore throat (pharynx). There is redness, pain, and swelling of your throat. HOME CARE   Drink enough fluids to keep your pee (urine) clear or pale yellow.  Only take medicine as told by your doctor.  You may get sick again if you do not take medicine as told. Finish your medicines, even if you start to feel better.  Do not take aspirin.  Rest.  Rinse your  mouth (gargle) with salt water ( tsp of salt per 1 qt of water) every 1-2 hours. This will help the pain.  If you are not at risk for choking, you can suck on hard candy or sore throat lozenges. GET HELP IF:  You have large, tender lumps on your neck.  You have a rash.  You cough up green, yellow-brown, or bloody spit. GET HELP RIGHT AWAY IF:   You have a stiff neck.  You drool or cannot swallow liquids.  You throw up (vomit)  or are not able to keep medicine or liquids down.  You have very bad pain that does not go away with medicine.  You have problems breathing (not from a stuffy nose). MAKE SURE YOU:   Understand these instructions.  Will watch your condition.  Will get help right away if you are not doing well or get worse.   This information is not intended to replace advice given to you by your health care provider. Make sure you discuss any questions you have with your health care provider.   Document Released: 06/12/2007 Document Revised: 10/14/2012 Document Reviewed: 08/31/2012 Elsevier Interactive Patient Education [employer]

## 2015-05-06 LAB — CULTURE, GROUP A STREP (THRC)

## 2015-11-09 ENCOUNTER — Emergency Department (HOSPITAL_COMMUNITY)
Admission: EM | Admit: 2015-11-09 | Discharge: 2015-11-09 | Disposition: A | Discharge status: Home / Self Care | Payer: Medicaid Other | Attending: Emergency Medicine | Admitting: Emergency Medicine

## 2015-11-09 ENCOUNTER — Emergency Department (HOSPITAL_COMMUNITY): Payer: Medicaid Other

## 2015-11-09 ENCOUNTER — Encounter (HOSPITAL_COMMUNITY): Payer: Self-pay | Admitting: Emergency Medicine

## 2015-11-09 DIAGNOSIS — J45909 Unspecified asthma, uncomplicated: Secondary | ICD-10-CM | POA: Insufficient documentation

## 2015-11-09 DIAGNOSIS — Z87891 Personal history of nicotine dependence: Secondary | ICD-10-CM | POA: Insufficient documentation

## 2015-11-09 DIAGNOSIS — O219 Vomiting of pregnancy, unspecified: Secondary | ICD-10-CM | POA: Diagnosis not present

## 2015-11-09 DIAGNOSIS — O0281 Inappropriate change in quantitative human chorionic gonadotropin (hCG) in early pregnancy: Secondary | ICD-10-CM | POA: Diagnosis not present

## 2015-11-09 DIAGNOSIS — Z3A08 8 weeks gestation of pregnancy: Secondary | ICD-10-CM | POA: Diagnosis not present

## 2015-11-09 LAB — BASIC METABOLIC PANEL
ANION GAP: 9 (ref 5–15)
BUN: 10 mg/dL (ref 6–20)
CALCIUM: 9.6 mg/dL (ref 8.9–10.3)
CO2: 24 mmol/L (ref 22–32)
Chloride: 102 mmol/L (ref 101–111)
Creatinine, Ser: 0.75 mg/dL (ref 0.44–1.00)
GLUCOSE: 81 mg/dL (ref 65–99)
POTASSIUM: 4.4 mmol/L (ref 3.5–5.1)
Sodium: 135 mmol/L (ref 135–145)

## 2015-11-09 LAB — HCG, QUANTITATIVE, PREGNANCY: HCG, BETA CHAIN, QUANT, S: 36018 m[IU]/mL — AB (ref <5)

## 2015-11-09 MED ORDER — PROMETHAZINE HCL 25 MG PO TABS: 25.0000 mg | ORAL_TABLET | Freq: Four times a day (QID) | ORAL | 0 refills | Status: DC | PRN

## 2015-11-09 MED ORDER — SODIUM CHLORIDE 0.9 % IV BOLUS (SEPSIS)
1000.0000 mL | Freq: Once | INTRAVENOUS | Status: AC
  Administered 2015-11-09: 1000 mL via INTRAVENOUS

## 2015-11-09 MED ORDER — ONDANSETRON HCL 4 MG/2ML IJ SOLN
4.0000 mg | Freq: Once | INTRAMUSCULAR | Status: AC
  Administered 2015-11-09: 4 mg via INTRAVENOUS
  Filled 2015-11-09: qty 2

## 2015-11-09 MED ORDER — ONDANSETRON 8 MG PO TBDP: ORAL_TABLET | ORAL | 0 refills | Status: DC

## 2015-11-09 NOTE — ED Provider Notes (Signed)
[facility]-EMERGENCY DEPT Provider Note   CSN: [id number] Arrival date & time: 11/09/15  1419     History   Chief Complaint Chief Complaint  Patient presents with  . Abdominal Pain  . Emesis During Pregnancy    HPI Cindy Pearson is a 20 y.o. female.  Patient is a 20 year old female with no significant past medical history. She is G4 [id number] presenting with lower abdominal discomfort, nausea, and vomiting which has worsened over the past several days. She reports being "unable to keep anything down". She denies any fevers or chills. She denies any bleeding or spotting. She reports taking a home pregnancy test 2 weeks ago which was positive. She tells me her last menstrual period was the beginning of September.   The history is provided by the patient.  Abdominal Pain   This is a new problem. The current episode started 2 days ago. The problem occurs constantly. The problem has been gradually worsening. The pain is associated with eating. The pain is located in the suprapubic region. The pain is moderate. Pertinent negatives include fever and dysuria. Nothing aggravates the symptoms. Nothing relieves the symptoms.    Past Medical History:  Diagnosis Date  . Asthma    Albuterol INH prn  . Chlamydia 2012   hasn't picked up rx, not treated yet  . Herpes    last outbreak 3 months ago    There are no active problems to display for this patient.   Past Surgical History:  Procedure Laterality Date  . CESAREAN SECTION    . TONSILLECTOMY AND ADENOIDECTOMY      OB History    Gravida Para Term Preterm AB Living   4 2 2  0 1 2   SAB TAB Ectopic Multiple Live Births   1 0 0 0 2       Home Medications    Prior to Admission medications   Medication Sig Start Date End Date Taking? Authorizing Provider  clindamycin (CLEOCIN) 150 MG capsule Take 3 capsules (450 mg total) by mouth 3 (three) times daily. Patient not taking: Reported on 11/09/2015 12/29/14   Cheri FowlerKayla Rose, PA-C    cyclobenzaprine (FLEXERIL) 10 MG tablet Take 1 tablet (10 mg total) by mouth 2 (two) times daily as needed for muscle spasms. Patient not taking: Reported on 11/09/2015 12/22/13   Harle BattiestElizabeth Tysinger, NP  ibuprofen (ADVIL,MOTRIN) 800 MG tablet Take 1 tablet (800 mg total) by mouth 3 (three) times daily. Patient not taking: Reported on 11/09/2015 12/29/14   Cheri FowlerKayla Rose, PA-C  medroxyPROGESTERone (DEPO-PROVERA) 150 MG/ML injection Inject 1 mL (150 mg total) into the muscle once. Patient not taking: Reported on 11/09/2015 10/07/13   Antionette CharLisa [name]-Moore, MD  naproxen (EC-NAPROSYN) 500 MG EC tablet Take 1 tablet (500 mg total) by mouth 2 (two) times daily with a meal. Patient not taking: Reported on 11/09/2015 07/29/14   Garlon HatchetLisa M Sanders, PA-C  predniSONE (DELTASONE) 50 MG tablet Take 1 tablet daily with breakfast Patient not taking: Reported on 11/09/2015 05/05/15   Wynetta EmeryNicole Pisciotta, PA-C    Family History Family History  Problem Relation Age of Onset  . Diabetes Maternal Grandmother   . Heart disease Maternal Grandfather   . Heart disease Paternal Grandfather     Social History Social History  Substance Use Topics  . Smoking status: Former Games developermoker  . Smokeless tobacco: Never Used  . Alcohol use No     Allergies   Penicillins   Review of Systems Review of Systems  Constitutional: Negative  for fever.  Gastrointestinal: Positive for abdominal pain.  Genitourinary: Negative for dysuria.  All other systems reviewed and are negative.    Physical Exam Updated Vital Signs BP 116/72 (BP Location: Right Arm)   Pulse 64   Temp 98.8 F (37.1 C) (Oral)   Resp 18   Ht 5\' 2"  (1.575 m)   Wt 140 lb (63.5 kg)   LMP 09/10/2015   SpO2 100%   BMI 25.61 kg/m   Physical Exam  Constitutional: She is oriented to person, place, and time. She appears well-developed and well-nourished. No distress.  HENT:  Head: Normocephalic and atraumatic.  Neck: Normal range of motion. Neck supple.   Cardiovascular: Normal rate and regular rhythm.  Exam reveals no gallop and no friction rub.   No murmur heard. Pulmonary/Chest: Effort normal and breath sounds normal. No respiratory distress. She has no wheezes.  Abdominal: Soft. Bowel sounds are normal. She exhibits no distension. There is tenderness.  There is mild suprapubic tenderness to palpation. There is no rebound and no guarding.  Musculoskeletal: Normal range of motion.  Neurological: She is alert and oriented to person, place, and time.  Skin: Skin is warm and dry. She is not diaphoretic.  Nursing note and vitals reviewed.    ED Treatments / Results  Labs (all labs ordered are listed, but only abnormal results are displayed) Labs Reviewed  HCG, QUANTITATIVE, PREGNANCY - Abnormal; Notable for the following:       Result Value   hCG, Beta Chain, Quant, S 36,018 (*)    All other components within normal limits  BASIC METABOLIC PANEL    EKG  EKG Interpretation None       Radiology No results found.  Procedures Procedures (including critical care time)  Medications Ordered in ED Medications  sodium chloride 0.9 % bolus 1,000 mL (not administered)  ondansetron (ZOFRAN) injection 4 mg (not administered)     Initial Impression / Assessment and Plan / ED Course  I have reviewed the triage vital signs and the nursing notes.  Pertinent labs & imaging results that were available during my care of the patient were reviewed by me and considered in my medical decision making (see chart for details).  Clinical Course    Patient presents with lower abdominal discomfort, vomiting, and home pregnancy test 2 weeks ago. There is no vaginal bleeding or discharge. Her workup today reveals an hCG consistent with stated gestational age and ultrasound confirms an 8 week intrauterine pregnancy. She is feeling better after fluids and Zofran and will be discharged to home. She is to follow-up with OB. She has an OB and will call  to arrange this appointment.  Final Clinical Impressions(s) / ED Diagnoses   Final diagnoses:  None    New Prescriptions New Prescriptions   No medications on file     Geoffery Lyonsouglas Anika Shore, MD 11/09/15 1814

## 2015-11-09 NOTE — ED Triage Notes (Signed)
Pt reports taking pregnancy test last week and was positive. Has abdominal cramping and nausea and vomiting. LMP was September 3rd to 6th. Denies discharge or bleeding.

## 2015-11-09 NOTE — Discharge Instructions (Signed)
Zofran as prescribed as needed for nausea.  Follow up with your Obstetrician in the next week for a follow up appointment.

## 2015-11-09 NOTE — ED Notes (Signed)
Patient transported to Ultrasound 

## 2015-12-05 ENCOUNTER — Encounter (HOSPITAL_COMMUNITY): Payer: Self-pay | Admitting: Emergency Medicine

## 2015-12-05 ENCOUNTER — Emergency Department (HOSPITAL_COMMUNITY): Payer: Medicaid Other

## 2015-12-05 ENCOUNTER — Emergency Department (HOSPITAL_COMMUNITY)
Admission: EM | Admit: 2015-12-05 | Discharge: 2015-12-05 | Disposition: A | Discharge status: Home / Self Care | Payer: Medicaid Other | Attending: Emergency Medicine | Admitting: Emergency Medicine

## 2015-12-05 DIAGNOSIS — J45909 Unspecified asthma, uncomplicated: Secondary | ICD-10-CM | POA: Insufficient documentation

## 2015-12-05 DIAGNOSIS — O9A211 Injury, poisoning and certain other consequences of external causes complicating pregnancy, first trimester: Secondary | ICD-10-CM | POA: Diagnosis present

## 2015-12-05 DIAGNOSIS — Y929 Unspecified place or not applicable: Secondary | ICD-10-CM | POA: Insufficient documentation

## 2015-12-05 DIAGNOSIS — Y999 Unspecified external cause status: Secondary | ICD-10-CM | POA: Insufficient documentation

## 2015-12-05 DIAGNOSIS — O26891 Other specified pregnancy related conditions, first trimester: Secondary | ICD-10-CM | POA: Insufficient documentation

## 2015-12-05 DIAGNOSIS — Z87891 Personal history of nicotine dependence: Secondary | ICD-10-CM | POA: Insufficient documentation

## 2015-12-05 DIAGNOSIS — S00531A Contusion of lip, initial encounter: Secondary | ICD-10-CM | POA: Diagnosis not present

## 2015-12-05 DIAGNOSIS — Y939 Activity, unspecified: Secondary | ICD-10-CM | POA: Diagnosis not present

## 2015-12-05 DIAGNOSIS — Z3A12 12 weeks gestation of pregnancy: Secondary | ICD-10-CM | POA: Diagnosis not present

## 2015-12-05 DIAGNOSIS — O26899 Other specified pregnancy related conditions, unspecified trimester: Secondary | ICD-10-CM

## 2015-12-05 DIAGNOSIS — R102 Pelvic and perineal pain: Secondary | ICD-10-CM | POA: Insufficient documentation

## 2015-12-05 DIAGNOSIS — S0990XA Unspecified injury of head, initial encounter: Secondary | ICD-10-CM

## 2015-12-05 MED ORDER — ACETAMINOPHEN 500 MG PO TABS
1000.0000 mg | ORAL_TABLET | Freq: Once | ORAL | Status: AC
  Administered 2015-12-05: 1000 mg via ORAL
  Filled 2015-12-05: qty 2

## 2015-12-05 MED ORDER — ONDANSETRON HCL 4 MG PO TABS: 4.0000 mg | ORAL_TABLET | Freq: Three times a day (TID) | ORAL | 0 refills | Status: DC | PRN

## 2015-12-05 MED ORDER — PRENATAL COMPLETE 14-0.4 MG PO TABS: 1.0000 | ORAL_TABLET | Freq: Every day | ORAL | 5 refills | Status: DC
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
## 2015-12-05 NOTE — Discharge Instructions (Signed)
Read the information below.  You may return to the Emergency Department at any time for worsening condition or any new symptoms that concern you.  ° °You have had a head injury which does not appear to require admission at this time. A concussion is a state of changed mental ability from trauma. °SEEK IMMEDIATE MEDICAL ATTENTION IF: °There is confusion or drowsiness (although children frequently become drowsy after injury).  °You cannot awaken the injured person.  °There is nausea (feeling sick to your stomach) or continued, forceful vomiting.  °You notice dizziness or unsteadiness which is getting worse, or inability to walk.  °You have convulsions or unconsciousness.  °You experience severe, persistent headaches not relieved by Tylenol?. (Do not take aspirin as this impairs clotting abilities). Take other pain medications only as directed.  °You cannot use arms or legs normally.  °There are changes in pupil sizes. (This is the black center in the colored part of the eye)  °There is clear or bloody discharge from the nose or ears.  °Change in speech, vision, swallowing, or understanding.  °Localized weakness, numbness, tingling, or change in bowel or bladder control.  °

## 2015-12-05 NOTE — ED Notes (Signed)
[employer] at bedside waiting on [facility]

## 2015-12-05 NOTE — ED Notes (Signed)
Placed patient on the monitor.
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
## 2016-01-11 ENCOUNTER — Encounter: Payer: Self-pay | Admitting: Certified Nurse Midwife

## 2016-01-22 ENCOUNTER — Encounter (HOSPITAL_COMMUNITY): Payer: Self-pay | Admitting: *Deleted

## 2016-01-22 ENCOUNTER — Inpatient Hospital Stay (HOSPITAL_COMMUNITY)
Admission: AD | Admit: 2016-01-22 | Discharge: 2016-01-22 | Disposition: A | Discharge status: Home / Self Care | Payer: Medicaid Other | Source: Ambulatory Visit | Attending: Obstetrics & Gynecology | Admitting: Obstetrics & Gynecology

## 2016-01-22 DIAGNOSIS — J45909 Unspecified asthma, uncomplicated: Secondary | ICD-10-CM | POA: Insufficient documentation

## 2016-01-22 DIAGNOSIS — O23591 Infection of other part of genital tract in pregnancy, first trimester: Secondary | ICD-10-CM | POA: Diagnosis not present

## 2016-01-22 DIAGNOSIS — O26892 Other specified pregnancy related conditions, second trimester: Secondary | ICD-10-CM | POA: Insufficient documentation

## 2016-01-22 DIAGNOSIS — Z79899 Other long term (current) drug therapy: Secondary | ICD-10-CM | POA: Diagnosis not present

## 2016-01-22 DIAGNOSIS — Z3A19 19 weeks gestation of pregnancy: Secondary | ICD-10-CM | POA: Diagnosis not present

## 2016-01-22 DIAGNOSIS — O218 Other vomiting complicating pregnancy: Secondary | ICD-10-CM | POA: Diagnosis present

## 2016-01-22 DIAGNOSIS — Z9889 Other specified postprocedural states: Secondary | ICD-10-CM | POA: Diagnosis not present

## 2016-01-22 DIAGNOSIS — R42 Dizziness and giddiness: Secondary | ICD-10-CM | POA: Insufficient documentation

## 2016-01-22 DIAGNOSIS — O23592 Infection of other part of genital tract in pregnancy, second trimester: Secondary | ICD-10-CM | POA: Insufficient documentation

## 2016-01-22 DIAGNOSIS — O99512 Diseases of the respiratory system complicating pregnancy, second trimester: Secondary | ICD-10-CM | POA: Insufficient documentation

## 2016-01-22 DIAGNOSIS — O219 Vomiting of pregnancy, unspecified: Secondary | ICD-10-CM | POA: Diagnosis not present

## 2016-01-22 DIAGNOSIS — N760 Acute vaginitis: Secondary | ICD-10-CM

## 2016-01-22 DIAGNOSIS — Z880 Allergy status to penicillin: Secondary | ICD-10-CM | POA: Insufficient documentation

## 2016-01-22 DIAGNOSIS — B9689 Other specified bacterial agents as the cause of diseases classified elsewhere: Secondary | ICD-10-CM | POA: Diagnosis not present

## 2016-01-22 LAB — URINALYSIS, ROUTINE W REFLEX MICROSCOPIC
BILIRUBIN URINE: NEGATIVE
GLUCOSE, UA: NEGATIVE mg/dL
Hgb urine dipstick: NEGATIVE
Ketones, ur: 80 mg/dL — AB
NITRITE: NEGATIVE
Protein, ur: 30 mg/dL — AB
SPECIFIC GRAVITY, URINE: 1.024 (ref 1.005–1.030)
pH: 6 (ref 5.0–8.0)

## 2016-01-22 LAB — WET PREP, GENITAL
SPERM: NONE SEEN
Trich, Wet Prep: NONE SEEN
YEAST WET PREP: NONE SEEN

## 2016-01-22 MED ORDER — METRONIDAZOLE 0.75 % VA GEL: 1.0000 | Freq: Every day | VAGINAL | 1 refills | Status: DC

## 2016-01-22 MED ORDER — PROMETHAZINE HCL 25 MG PO TABS: 25.0000 mg | ORAL_TABLET | Freq: Four times a day (QID) | ORAL | 2 refills | Status: DC | PRN

## 2016-01-22 MED ORDER — PROMETHAZINE HCL 25 MG PO TABS
25.0000 mg | ORAL_TABLET | Freq: Once | ORAL | Status: AC
  Administered 2016-01-22: 25 mg via ORAL
  Filled 2016-01-22: qty 1
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
## 2016-01-22 NOTE — MAU Provider Note (Signed)
[facility] Attending MAU Note  Chief Complaint: Abdominal Pain; Dizziness; and Emesis During Pregnancy    First Provider Initiated Contact with Patient 01/22/16 1923      SUBJECTIVE Cindy Pearson is a 21 y.o. [id number] at [redacted]w[redacted]d by LMP who presents with lower pelvic pain, mild vaginal discharge and nausea. She has noted persistent nausea and vomiting this pregnancy. Has no anti-emetics. She is feeling excellent fetal movement. She does not report vaginal bleeding, leaking of fluid. Notes feeling hot prior to emesis, but no fever. She has no sick contacts. N/V appear to be pregnancy related.  Past Medical History:  Diagnosis Date  . Asthma    Albuterol INH prn  . Chlamydia 2012   hasn't picked up rx, not treated yet  . Herpes    last outbreak 3 months ago   OB History  Gravida Para Term Preterm AB Living  4 2 2  0 1 2  SAB TAB Ectopic Multiple Live Births  1 0 0 0 2    # Outcome Date GA Lbr Len/2nd Weight Sex Delivery Anes PTL Lv  4 Current           3 Term 03/12/11 [redacted]w[redacted]d  7 lb 1.4 oz (3.215 kg) M VBAC EPI  LIV  2 Term 07/11/09 [redacted]w[redacted]d  6 lb 14 oz (3.118 kg) F CS-LTranv EPI  LIV     Birth Comments: Failure to progress   1 SAB              Past Surgical History:  Procedure Laterality Date  . CESAREAN SECTION    . TONSILLECTOMY AND ADENOIDECTOMY     Social History   Social History  . Marital status: Single    Spouse name: N/A  . Number of children: 2  . Years of education: N/A   Occupational History  . [employer]    Social History Main Topics  . Smoking status: Former Games developer  . Smokeless tobacco: Never Used  . Alcohol use No  . Drug use: No  . Sexual activity: Yes    Partners: Female    Birth control/ protection: Condom     Comment: 1 partner   Other Topics Concern  . Not on file   Social History Narrative  . No narrative on file   No current facility-administered medications on file prior to encounter.    Current Outpatient Prescriptions  on File Prior to Encounter  Medication Sig Dispense Refill  . ondansetron (ZOFRAN) 4 MG tablet Take 1 tablet (4 mg total) by mouth every 8 (eight) hours as needed for nausea or vomiting. (Patient not taking: Reported on 01/22/2016) 15 tablet 0  . Prenatal Vit-Fe Fumarate-FA (PRENATAL COMPLETE) 14-0.4 MG TABS Take 1 tablet by mouth daily. (Patient not taking: Reported on 01/22/2016) 60 each 5   Allergies  Allergen Reactions  . Penicillins Hives    Patient states that she is allergic to all penicillins. Has patient had a PCN reaction causing immediate rash, facial/tongue/throat swelling, SOB or lightheadedness with hypotension: YES Has patient had a PCN reaction causing severe rash involving mucus membranes or skin necrosis: NO Has patient had a PCN reaction that required hospitalization NO Has patient had a PCN reaction occurring within the last 10 years:NO If all of the above answers are "NO", then may proceed with Cephalosporin use.    ROS: Pertinent items in HPI  OBJECTIVE BP 98/62 (BP Location: Left Arm)   Pulse 75   Temp 98.4 F (36.9 C) (  Oral)   Resp 18   Ht 5\' 1"  (1.549 m)   Wt 140 lb (63.5 kg)   LMP 09/10/2015   BMI 26.45 kg/m  CONSTITUTIONAL: Well-developed, well-nourished female in no acute distress.  HENT:  Normocephalic, atraumatic, External right and left ear normal. Oropharynx is clear and moist EYES: Conjunctivae and EOM are normal. No scleral icterus.  NECK: Normal range of motion, supple, no masses.  Normal thyroid.  SKIN: Skin is warm and dry. No rash noted. Not diaphoretic. No erythema. No pallor. NEUROLGIC: Alert and oriented to person, place, and time. No cranial nerve deficit noted. PSYCHIATRIC: Normal mood and affect. Normal behavior. Normal judgment and thought content. CARDIOVASCULAR: Normal heart rate noted RESPIRATORY: Effort and breath sounds normal, no problems with respiration noted. ABDOMEN: Soft, normal bowel sounds, no distention noted.  No  tenderness, rebound or guarding.  PELVIC: Normal appearing external genitalia; normal appearing vaginal mucosa and cervix.  Yellow discharge, wet prep and pelvic cultures obtained.  Uterus is at umbilicus and not tender. MUSCULOSKELETAL: Normal range of motion. No tenderness.  No cyanosis, clubbing, or edema.  2+ distal pulses.  LAB RESULTS Results for orders placed or performed during the hospital encounter of 01/22/16 (from the past 48 hour(s))  Urinalysis, Routine w reflex microscopic     Status: Abnormal   Collection Time: 01/22/16  5:11 PM  Result Value Ref Range   Color, Urine YELLOW YELLOW   APPearance CLEAR CLEAR   Specific Gravity, Urine 1.024 1.005 - 1.030   pH 6.0 5.0 - 8.0   Glucose, UA NEGATIVE NEGATIVE mg/dL   Hgb urine dipstick NEGATIVE NEGATIVE   Bilirubin Urine NEGATIVE NEGATIVE   Ketones, ur 80 (A) NEGATIVE mg/dL   Protein, ur 30 (A) NEGATIVE mg/dL   Nitrite NEGATIVE NEGATIVE   Leukocytes, UA TRACE (A) NEGATIVE   RBC / HPF 0-5 0 - 5 RBC/hpf   WBC, UA 0-5 0 - 5 WBC/hpf   Bacteria, UA RARE (A) NONE SEEN   Squamous Epithelial / LPF 0-5 (A) NONE SEEN   Mucous PRESENT   Wet prep, genital     Status: Abnormal   Collection Time: 01/22/16  7:30 PM  Result Value Ref Range   Yeast Wet Prep HPF POC NONE SEEN NONE SEEN   Trich, Wet Prep NONE SEEN NONE SEEN   Clue Cells Wet Prep HPF POC PRESENT (A) NONE SEEN   WBC, Wet Prep HPF POC MANY (A) NONE SEEN    Comment: FEW BACTERIA SEEN   Sperm NONE SEEN     MAU COURSE  ASSESSMENT 1. Bacterial vaginosis   2. Nausea and vomiting of pregnancy, antepartum     PLAN Discharge home Will order anatomy scan and look at an ovarian cyst which was present on initial u/s.  Allergies as of 01/22/2016      Reactions   Penicillins Hives   Patient states that she is allergic to all penicillins. Has patient had a PCN reaction causing immediate rash, facial/tongue/throat swelling, SOB or lightheadedness with hypotension: YES Has patient  had a PCN reaction causing severe rash involving mucus membranes or skin necrosis: NO Has patient had a PCN reaction that required hospitalization NO Has patient had a PCN reaction occurring within the last 10 years:NO If all of the above answers are "NO", then may proceed with Cephalosporin use.      Medication List    STOP taking these medications   ondansetron 4 MG tablet Commonly known as:  ZOFRAN  TAKE these medications   metroNIDAZOLE 0.75 % vaginal gel Commonly known as:  METROGEL Place 1 Applicatorful vaginally at bedtime. Apply one applicatorful to vagina at bedtime for 5 days   PRENATAL COMPLETE 14-0.4 MG Tabs Take 1 tablet by mouth daily.   promethazine 25 MG tablet Commonly known as:  PHENERGAN Take 1 tablet (25 mg total) by mouth every 6 (six) hours as needed for nausea.        Reva Boresanya S Joshawn Crissman, MD 01/22/2016 9:26 PM

## 2016-01-22 NOTE — Discharge Instructions (Signed)
Bacterial Vaginosis Bacterial vaginosis is a vaginal infection that occurs when the normal balance of bacteria in the vagina is disrupted. It results from an overgrowth of certain bacteria. This is the most common vaginal infection among women ages 55-44. Because bacterial vaginosis increases your risk for STIs (sexually transmitted infections), getting treated can help reduce your risk for chlamydia, gonorrhea, herpes, and HIV (human immunodeficiency virus). Treatment is also important for preventing complications in pregnant women, because this condition can cause an early (premature) delivery. What are the causes? This condition is caused by an increase in harmful bacteria that are normally present in small amounts in the vagina. However, the reason that the condition develops is not fully understood. What increases the risk? The following factors may make you more likely to develop this condition:  Having a new sexual partner or multiple sexual partners.  Having unprotected sex.  Douching.  Having an intrauterine device (IUD).  Smoking.  Drug and alcohol abuse.  Taking certain antibiotic medicines.  Being pregnant. You cannot get bacterial vaginosis from toilet seats, bedding, swimming pools, or contact with objects around you. What are the signs or symptoms? Symptoms of this condition include:  Grey or white vaginal discharge. The discharge can also be watery or foamy.  A fish-like odor with discharge, especially after sexual intercourse or during menstruation.  Itching in and around the vagina.  Burning or pain with urination. Some women with bacterial vaginosis have no signs or symptoms. How is this diagnosed? This condition is diagnosed based on:  Your medical history.  A physical exam of the vagina.  Testing a sample of vaginal fluid under a microscope to look for a large amount of bad bacteria or abnormal cells. Your health care provider may use a cotton swab or a  small wooden spatula to collect the sample. How is this treated? This condition is treated with antibiotics. These may be given as a pill, a vaginal cream, or a medicine that is put into the vagina (suppository). If the condition comes back after treatment, a second round of antibiotics may be needed. Follow these instructions at home: Medicines  Take over-the-counter and prescription medicines only as told by your health care provider.  Take or use your antibiotic as told by your health care provider. Do not stop taking or using the antibiotic even if you start to feel better. General instructions  If you have a female sexual partner, tell her that you have a vaginal infection. She should see her health care provider and be treated if she has symptoms. If you have a female sexual partner, he does not need treatment.  During treatment:  Avoid sexual activity until you finish treatment.  Do not douche.  Avoid alcohol as directed by your health care provider.  Avoid breastfeeding as directed by your health care provider.  Drink enough water and fluids to keep your urine clear or pale yellow.  Keep the area around your vagina and rectum clean.  Wash the area daily with warm water.  Wipe yourself from front to back after using the toilet.  Keep all follow-up visits as told by your health care provider. This is important. How is this prevented?  Do not douche.  Wash the outside of your vagina with warm water only.  Use protection when having sex. This includes latex condoms and dental dams.  Limit how many sexual partners you have. To help prevent bacterial vaginosis, it is best to have sex with just one partner (monogamous).  Make sure you and your sexual partner are tested for STIs.  Wear cotton or cotton-lined underwear.  Avoid wearing tight pants and pantyhose, especially during summer.  Limit the amount of alcohol that you drink.  Do not use any products that contain  nicotine or tobacco, such as cigarettes and e-cigarettes. If you need help quitting, ask your health care provider.  Do not use illegal drugs. Where to find more information:  Centers for Disease Control and Prevention: SolutionApps.co.za  American Sexual Health Association (ASHA): www.ashastd.org  U.S. Department of Health and Health and safety inspector, Office on Women's Health: ConventionalMedicines.si or http://www.anderson-williamson.info/ Contact a health care provider if:  Your symptoms do not improve, even after treatment.  You have more discharge or pain when urinating.  You have a fever.  You have pain in your abdomen.  You have pain during sex.  You have vaginal bleeding between periods. Summary  Bacterial vaginosis is a vaginal infection that occurs when the normal balance of bacteria in the vagina is disrupted.  Because bacterial vaginosis increases your risk for STIs (sexually transmitted infections), getting treated can help reduce your risk for chlamydia, gonorrhea, herpes, and HIV (human immunodeficiency virus). Treatment is also important for preventing complications in pregnant women, because the condition can cause an early (premature) delivery.  This condition is treated with antibiotic medicines. These may be given as a pill, a vaginal cream, or a medicine that is put into the vagina (suppository). This information is not intended to replace advice given to you by your health care provider. Make sure you discuss any questions you have with your health care provider. Document Released: 12/24/2004 Document Revised: 09/09/2015 Document Reviewed: 09/09/2015 Elsevier Interactive Patient Education  2017 [employer].  Nausea and vomiting of pregnancy It may cause you to have nausea or vomiting all day for many days. It may keep you from eating and drinking enough food and liquids. Hyperemesis usually occurs during the first half (the first 20 weeks) of  pregnancy. It often goes away once a woman is in her second half of pregnancy. However, sometimes it continues through an entire pregnancy. What are the causes? The cause of this condition is not known. It may be related to changes in chemicals (hormones) in the body during pregnancy, such as the high level of pregnancy hormone (human chorionic gonadotropin) or the increase in the female sex hormone (estrogen). What are the signs or symptoms? Symptoms of this condition include:  Severe nausea and vomiting.  Nausea that does not go away.  Vomiting that does not allow you to keep any food down.  Weight loss.  Body fluid loss (dehydration).  Having no desire to eat, or not liking food that you have previously enjoyed. How is this diagnosed? This condition may be diagnosed based on:  A physical exam.  Your medical history.  Your symptoms.  Blood tests.  Urine tests. How is this treated? This condition may be managed with medicine. If medicines to do not help relieve nausea and vomiting, you may need to receive fluids through an IV tube at the hospital. Follow these instructions at home:  Take over-the-counter and prescription medicines only as told by your health care provider.  Avoid iron pills and multivitamins that contain iron for the first 3-4 months of pregnancy. If you take prescription iron pills, do not stop taking them unless your health care provider approves.  Take the following actions to help prevent nausea and vomiting:  In the morning, before getting out of  bed, try eating a couple of dry crackers or a piece of toast.  Avoid foods and smells that upset your stomach. Fatty and spicy foods may make nausea worse.  Eat 5-6 small meals a day.  Do not drink fluids while eating meals. Drink between meals.  Eat or suck on things that have ginger in them. Ginger can help relieve nausea.  Avoid food preparation. The smell of food can spoil your appetite or trigger  nausea.  Follow instructions from your health care provider about eating or drinking restrictions.  For snacks, eat high-protein foods, such as cheese.  Keep all follow-up and pre-birth (prenatal) visits as told by your health care provider. This is important. Contact a health care provider if:  You have pain in your abdomen.  You have a severe headache.  You have vision problems.  You are losing weight. Get help right away if:  You cannot drink fluids without vomiting.  You vomit blood.  You have constant nausea and vomiting.  You are very weak.  You are very thirsty.  You feel dizzy.  You faint.  You have a fever or other symptoms that last for more than 2-3 days.  You have a fever and your symptoms suddenly get worse. Summary  Making some changes to your eating habits may help relieve nausea and vomiting.  This condition may be managed with medicine.  If medicines to do not help relieve nausea and vomiting, you may need to receive fluids through an IV tube at the hospital. This information is not intended to replace advice given to you by your health care provider. Make sure you discuss any questions you have with your health care provider. Document Released: 12/24/2004 Document Revised: 08/23/2015 Document Reviewed: 08/23/2015 Elsevier Interactive Patient Education  2017 [employer]

## 2016-01-23 LAB — GC/CHLAMYDIA PROBE AMP (~~LOC~~) NOT AT ARMC
Chlamydia: NEGATIVE
Neisseria Gonorrhea: NEGATIVE

## 2016-01-30 ENCOUNTER — Ambulatory Visit (HOSPITAL_COMMUNITY)
Admission: RE | Admit: 2016-01-30 | Discharge: 2016-01-30 | Disposition: A | Discharge status: Home / Self Care | Source: Ambulatory Visit | Attending: Family Medicine | Admitting: Family Medicine

## 2016-01-30 ENCOUNTER — Other Ambulatory Visit (HOSPITAL_COMMUNITY): Payer: Self-pay | Admitting: Family Medicine

## 2016-01-30 DIAGNOSIS — Z363 Encounter for antenatal screening for malformations: Secondary | ICD-10-CM

## 2016-01-30 DIAGNOSIS — O219 Vomiting of pregnancy, unspecified: Secondary | ICD-10-CM

## 2016-01-30 DIAGNOSIS — Z3A20 20 weeks gestation of pregnancy: Secondary | ICD-10-CM | POA: Diagnosis present

## 2016-02-01 ENCOUNTER — Encounter: Payer: Self-pay | Admitting: Certified Nurse Midwife

## 2016-02-26 ENCOUNTER — Ambulatory Visit: Payer: Self-pay | Admitting: Obstetrics

## 2016-04-10 ENCOUNTER — Telehealth: Payer: Self-pay | Admitting: *Deleted

## 2016-04-10 NOTE — Telephone Encounter (Signed)
Pt called to office stating she is having increase pelvic pressure and pain with urination.  Return call to pt.  Pt made aware that she should keep her appt on Monday for NOB.  Until that time any problems she has should be seen at [facility]  Pt advised to be seen due to urinary problems and rule out UTI as well as pelvic pressure. Pt again advised to keep appt here for Monday.  Pt states understanding.

## 2016-04-11 ENCOUNTER — Encounter (HOSPITAL_COMMUNITY): Payer: Self-pay | Admitting: *Deleted

## 2016-04-11 ENCOUNTER — Inpatient Hospital Stay (HOSPITAL_COMMUNITY)
Admission: AD | Admit: 2016-04-11 | Discharge: 2016-04-11 | Disposition: A | Discharge status: Home / Self Care | Payer: Medicaid Other | Source: Ambulatory Visit | Attending: Obstetrics & Gynecology | Admitting: Obstetrics & Gynecology

## 2016-04-11 DIAGNOSIS — O0933 Supervision of pregnancy with insufficient antenatal care, third trimester: Secondary | ICD-10-CM | POA: Diagnosis not present

## 2016-04-11 DIAGNOSIS — Z3A31 31 weeks gestation of pregnancy: Secondary | ICD-10-CM | POA: Insufficient documentation

## 2016-04-11 DIAGNOSIS — R102 Pelvic and perineal pain: Secondary | ICD-10-CM

## 2016-04-11 DIAGNOSIS — N949 Unspecified condition associated with female genital organs and menstrual cycle: Secondary | ICD-10-CM | POA: Diagnosis not present

## 2016-04-11 DIAGNOSIS — O26893 Other specified pregnancy related conditions, third trimester: Secondary | ICD-10-CM | POA: Diagnosis present

## 2016-04-11 LAB — URINALYSIS, ROUTINE W REFLEX MICROSCOPIC
BILIRUBIN URINE: NEGATIVE
GLUCOSE, UA: NEGATIVE mg/dL
HGB URINE DIPSTICK: NEGATIVE
KETONES UR: NEGATIVE mg/dL
NITRITE: NEGATIVE
PH: 6 (ref 5.0–8.0)
Protein, ur: NEGATIVE mg/dL
Specific Gravity, Urine: 1.023 (ref 1.005–1.030)

## 2016-04-11 LAB — WET PREP, GENITAL
Clue Cells Wet Prep HPF POC: NONE SEEN
Sperm: NONE SEEN
Trich, Wet Prep: NONE SEEN
Yeast Wet Prep HPF POC: NONE SEEN

## 2016-04-11 LAB — FETAL FIBRONECTIN: FETAL FIBRONECTIN: NEGATIVE

## 2016-04-11 MED ORDER — COMFORT FIT MATERNITY SUPP LG MISC: 1.0000 | Freq: Every day | 0 refills | Status: DC

## 2016-04-11 NOTE — MAU Note (Signed)
Pt reports with c/o pelvic pressure that began months ago but has "increase over time" and "feels like something is trying to come out".  Denies vaginal bleeding or LOF, but has white vaginal discharge.

## 2016-04-11 NOTE — MAU Note (Signed)
Urine in lab 

## 2016-04-12 LAB — CULTURE, OB URINE

## 2016-04-15 ENCOUNTER — Other Ambulatory Visit (HOSPITAL_COMMUNITY)
Admission: RE | Admit: 2016-04-15 | Discharge: 2016-04-15 | Disposition: A | Discharge status: Home / Self Care | Payer: Medicaid Other | Source: Ambulatory Visit | Attending: Obstetrics | Admitting: Obstetrics

## 2016-04-15 ENCOUNTER — Ambulatory Visit (INDEPENDENT_AMBULATORY_CARE_PROVIDER_SITE_OTHER): Payer: Medicaid Other | Admitting: Obstetrics

## 2016-04-15 ENCOUNTER — Telehealth: Payer: Self-pay

## 2016-04-15 ENCOUNTER — Encounter: Payer: Self-pay | Admitting: Obstetrics

## 2016-04-15 VITALS — BP 102/68 | HR 101 | Wt 155.6 lb

## 2016-04-15 DIAGNOSIS — O0933 Supervision of pregnancy with insufficient antenatal care, third trimester: Secondary | ICD-10-CM

## 2016-04-15 DIAGNOSIS — R87610 Atypical squamous cells of undetermined significance on cytologic smear of cervix (ASC-US): Secondary | ICD-10-CM | POA: Diagnosis not present

## 2016-04-15 DIAGNOSIS — Z3689 Encounter for other specified antenatal screening: Secondary | ICD-10-CM

## 2016-04-15 DIAGNOSIS — K5901 Slow transit constipation: Secondary | ICD-10-CM | POA: Diagnosis not present

## 2016-04-15 DIAGNOSIS — O24410 Gestational diabetes mellitus in pregnancy, diet controlled: Secondary | ICD-10-CM | POA: Diagnosis not present

## 2016-04-15 MED ORDER — DOCUSATE SODIUM 100 MG PO CAPS: 100.0000 mg | ORAL_CAPSULE | Freq: Two times a day (BID) | ORAL | 5 refills | Status: DC

## 2016-04-15 MED ORDER — PRENATE MINI 29-0.6-0.4-350 MG PO CAPS: 1.0000 | ORAL_CAPSULE | Freq: Every day | ORAL | 3 refills | Status: DC

## 2016-04-15 NOTE — Progress Notes (Signed)
Patient is having some swelling at night. She is having some light contractions- she was checked at the hospital and she states she was 1cm but closed. Head is down.

## 2016-04-16 ENCOUNTER — Encounter: Payer: Self-pay | Admitting: Obstetrics

## 2016-04-16 LAB — CERVICOVAGINAL ANCILLARY ONLY
Bacterial vaginitis: POSITIVE — AB
Candida vaginitis: NEGATIVE
Chlamydia: NEGATIVE
NEISSERIA GONORRHEA: NEGATIVE
Trichomonas: NEGATIVE

## 2016-04-16 NOTE — Progress Notes (Signed)
Subjective:  Cindy Pearson is a 21 y.o. [phone] at [redacted]w[redacted]d being seen today for ongoing prenatal care.  She is currently monitored for the following issues for this high-risk pregnancy and has Late prenatal care affecting pregnancy, antepartum, third trimester on her problem list.  Patient reports no complaints.  Contractions: Irregular. Vag. Bleeding: None.  Movement: Present. Denies leaking of fluid.   The following portions of the patient's history were reviewed and updated as appropriate: allergies, current medications, past family history, past medical history, past social history, past surgical history and problem list. Problem list updated.  Objective:   Vitals:   04/15/16 1548  BP: 102/68  Pulse: (!) 101  Weight: 155 lb 9.6 oz (70.6 kg)    Fetal Status: Fetal Heart Rate (bpm): 150   Movement: Present     General:  Alert, oriented and cooperative. Patient is in no acute distress.  Skin: Skin is warm and dry. No rash noted.   Cardiovascular: Normal heart rate noted  Respiratory: Normal respiratory effort, no problems with respiration noted  Abdomen: Soft, gravid, appropriate for gestational age. Pain/Pressure: Present     Pelvic:  Cervical exam deferred        Extremities: Normal range of motion.  Edema: None  Mental Status: Normal mood and affect. Normal behavior. Normal judgment and thought content.   Urinalysis: Urine Protein: Negative Urine Glucose: Negative  Assessment and Plan:  Pregnancy: [id number] at [redacted]w[redacted]d  1. Late prenatal care affecting pregnancy, antepartum, third trimester Rx - Obstetric Panel, Including HIV - Cytology - PAP - Varicella zoster antibody, IgG - Hemoglobinopathy evaluation - Cervicovaginal ancillary only - Prenat w/o A-FeCbn-Meth-FA-DHA (PRENATE MINI) 29-0.6-0.4-350 MG CAPS; Take 1 capsule by mouth daily before breakfast.  Dispense: 90 capsule; Refill: 3 - docusate sodium (COLACE) 100 MG capsule; Take 1 capsule (100 mg total) by mouth 2 (two) times  daily.  Dispense: 60 capsule; Refill: 5  2. Ultrasound scan to check interval growth of fetus Rx - [place] MFM OB FOLLOW UP; Future  3. Screening, antenatal, for fetal anatomic survey Rx - [place] [facility] OB FOLLOW UP; Future  4. Diet controlled gestational diabetes mellitus (GDM), antepartum Rx: - Referral to Nutrition and Diabetes Services  5. Slow transit constipation Rx: -Colace 100 mg bid  Preterm labor symptoms and general obstetric precautions including but not limited to vaginal bleeding, contractions, leaking of fluid and fetal movement were reviewed in detail with the patient. Please refer to After Visit Summary for other counseling recommendations.  Return in 2 weeks (on 04/29/2016).   Brock Bad, MDPatient ID: [name], female   DOB: Apr 14, 1995, 21 y.o.   MRN: [id number]

## 2016-04-16 NOTE — Telephone Encounter (Signed)
Not Available - Call pt. to come in and her her Prenatal labs done; she left the office without getting them done.Got no answer/not accepting calls at this time. Home number does not work either.

## 2016-04-17 ENCOUNTER — Other Ambulatory Visit: Payer: Self-pay | Admitting: Obstetrics

## 2016-04-17 DIAGNOSIS — N760 Acute vaginitis: Principal | ICD-10-CM

## 2016-04-17 DIAGNOSIS — B9689 Other specified bacterial agents as the cause of diseases classified elsewhere: Secondary | ICD-10-CM

## 2016-04-17 MED ORDER — METRONIDAZOLE 500 MG PO TABS: 500.0000 mg | ORAL_TABLET | Freq: Two times a day (BID) | ORAL | 2 refills | Status: DC

## 2016-04-18 LAB — CYTOLOGY - PAP
DIAGNOSIS: UNDETERMINED — AB
HPV (WINDOPATH): DETECTED — AB

## 2016-04-29 ENCOUNTER — Encounter: Payer: Self-pay | Admitting: Obstetrics

## 2016-05-01 ENCOUNTER — Other Ambulatory Visit: Payer: Self-pay | Admitting: Obstetrics

## 2016-05-01 ENCOUNTER — Ambulatory Visit (HOSPITAL_COMMUNITY)
Admission: RE | Admit: 2016-05-01 | Discharge: 2016-05-01 | Disposition: A | Discharge status: Home / Self Care | Payer: Medicaid Other | Source: Ambulatory Visit | Attending: Obstetrics | Admitting: Obstetrics

## 2016-05-01 DIAGNOSIS — O365930 Maternal care for other known or suspected poor fetal growth, third trimester, not applicable or unspecified: Secondary | ICD-10-CM

## 2016-05-01 DIAGNOSIS — Z3689 Encounter for other specified antenatal screening: Secondary | ICD-10-CM | POA: Diagnosis not present

## 2016-05-01 DIAGNOSIS — Z3A33 33 weeks gestation of pregnancy: Secondary | ICD-10-CM | POA: Diagnosis not present

## 2016-05-01 DIAGNOSIS — O0933 Supervision of pregnancy with insufficient antenatal care, third trimester: Secondary | ICD-10-CM | POA: Insufficient documentation

## 2016-05-01 MED ORDER — BETAMETHASONE SOD PHOS & ACET 6 (3-3) MG/ML IJ SUSP
12.0000 mg | Freq: Once | INTRAMUSCULAR | Status: AC
  Administered 2016-05-01: 12 mg via INTRAMUSCULAR
  Filled 2016-05-01: qty 2

## 2016-05-02 ENCOUNTER — Ambulatory Visit (HOSPITAL_COMMUNITY)
Admission: RE | Admit: 2016-05-02 | Discharge: 2016-05-02 | Disposition: A | Discharge status: Home / Self Care | Payer: Medicaid Other | Source: Ambulatory Visit | Attending: Obstetrics | Admitting: Obstetrics

## 2016-05-02 ENCOUNTER — Other Ambulatory Visit (HOSPITAL_COMMUNITY): Payer: Self-pay | Admitting: *Deleted

## 2016-05-02 ENCOUNTER — Other Ambulatory Visit: Payer: Medicaid Other

## 2016-05-02 DIAGNOSIS — O365930 Maternal care for other known or suspected poor fetal growth, third trimester, not applicable or unspecified: Secondary | ICD-10-CM | POA: Insufficient documentation

## 2016-05-02 DIAGNOSIS — Z3A35 35 weeks gestation of pregnancy: Secondary | ICD-10-CM | POA: Insufficient documentation

## 2016-05-02 MED ORDER — BETAMETHASONE SOD PHOS & ACET 6 (3-3) MG/ML IJ SUSP
12.0000 mg | Freq: Once | INTRAMUSCULAR | Status: DC
Start: 2016-05-02 — End: 2016-05-03
  Filled 2016-05-02: qty 2

## 2016-05-02 NOTE — ED Notes (Signed)
Pt given second dose of BMZ in Left upper outer glute.  Pt tolerated well.

## 2016-05-07 ENCOUNTER — Encounter: Payer: Medicaid Other | Admitting: Obstetrics

## 2016-05-07 LAB — HEMOGLOBINOPATHY EVALUATION
HEMOGLOBIN F QUANTITATION: 0 % (ref 0.0–2.0)
HGB C: 0 %
HGB S: 0 %
HGB VARIANT: 0 %
Hemoglobin A2 Quantitation: 2.2 % (ref 1.8–3.2)
Hgb A: 97.8 % (ref 96.4–98.8)

## 2016-05-07 LAB — OBSTETRIC PANEL, INCLUDING HIV
ANTIBODY SCREEN: NEGATIVE
BASOS ABS: 0 10*3/uL (ref 0.0–0.2)
BASOS: 0 %
EOS (ABSOLUTE): 0 10*3/uL (ref 0.0–0.4)
Eos: 0 %
HIV Screen 4th Generation wRfx: NONREACTIVE
Hematocrit: 31.1 % — ABNORMAL LOW (ref 34.0–46.6)
Hemoglobin: 10.4 g/dL — ABNORMAL LOW (ref 11.1–15.9)
Hepatitis B Surface Ag: NEGATIVE
IMMATURE GRANS (ABS): 0 10*3/uL (ref 0.0–0.1)
IMMATURE GRANULOCYTES: 0 %
LYMPHS: 11 %
Lymphocytes Absolute: 1.2 10*3/uL (ref 0.7–3.1)
MCH: 28.1 pg (ref 26.6–33.0)
MCHC: 33.4 g/dL (ref 31.5–35.7)
MCV: 84 fL (ref 79–97)
MONOCYTES: 8 %
MONOS ABS: 0.9 10*3/uL (ref 0.1–0.9)
Neutrophils Absolute: 9 10*3/uL — ABNORMAL HIGH (ref 1.4–7.0)
Neutrophils: 81 %
PLATELETS: 151 10*3/uL (ref 150–379)
RBC: 3.7 x10E6/uL — AB (ref 3.77–5.28)
RDW: 14.1 % (ref 12.3–15.4)
RPR Ser Ql: NONREACTIVE
Rh Factor: POSITIVE
Rubella Antibodies, IGG: 1.93 index (ref >0.99)
WBC: 11.1 10*3/uL — ABNORMAL HIGH (ref 3.4–10.8)

## 2016-05-07 LAB — VARICELLA ZOSTER ANTIBODY, IGG: VARICELLA: 434 {index} (ref >165)

## 2016-05-08 ENCOUNTER — Ambulatory Visit (HOSPITAL_COMMUNITY)
Admission: RE | Admit: 2016-05-08 | Discharge: 2016-05-08 | Disposition: A | Discharge status: Home / Self Care | Payer: Medicaid Other | Source: Ambulatory Visit | Attending: Obstetrics | Admitting: Obstetrics

## 2016-05-08 ENCOUNTER — Encounter (HOSPITAL_COMMUNITY): Payer: Self-pay

## 2016-05-08 DIAGNOSIS — Z3A34 34 weeks gestation of pregnancy: Secondary | ICD-10-CM | POA: Insufficient documentation

## 2016-05-08 DIAGNOSIS — O365930 Maternal care for other known or suspected poor fetal growth, third trimester, not applicable or unspecified: Secondary | ICD-10-CM | POA: Diagnosis present

## 2016-05-15 ENCOUNTER — Ambulatory Visit (HOSPITAL_COMMUNITY)
Admission: RE | Admit: 2016-05-15 | Discharge: 2016-05-15 | Disposition: A | Discharge status: Home / Self Care | Payer: Medicaid Other | Source: Ambulatory Visit | Attending: Obstetrics | Admitting: Obstetrics

## 2016-05-15 ENCOUNTER — Encounter (HOSPITAL_COMMUNITY): Payer: Self-pay

## 2016-05-15 DIAGNOSIS — O365930 Maternal care for other known or suspected poor fetal growth, third trimester, not applicable or unspecified: Secondary | ICD-10-CM | POA: Diagnosis present

## 2016-05-15 DIAGNOSIS — Z3A35 35 weeks gestation of pregnancy: Secondary | ICD-10-CM | POA: Insufficient documentation

## 2016-05-17 ENCOUNTER — Ambulatory Visit (INDEPENDENT_AMBULATORY_CARE_PROVIDER_SITE_OTHER): Payer: Medicaid Other | Admitting: Obstetrics

## 2016-05-17 ENCOUNTER — Encounter: Payer: Self-pay | Admitting: Obstetrics

## 2016-05-17 VITALS — BP 101/63 | HR 77 | Wt 159.0 lb

## 2016-05-17 DIAGNOSIS — O365930 Maternal care for other known or suspected poor fetal growth, third trimester, not applicable or unspecified: Secondary | ICD-10-CM

## 2016-05-17 DIAGNOSIS — O0933 Supervision of pregnancy with insufficient antenatal care, third trimester: Secondary | ICD-10-CM | POA: Diagnosis not present

## 2016-05-17 DIAGNOSIS — O365990 Maternal care for other known or suspected poor fetal growth, unspecified trimester, not applicable or unspecified: Secondary | ICD-10-CM | POA: Insufficient documentation

## 2016-05-17 DIAGNOSIS — O34219 Maternal care for unspecified type scar from previous cesarean delivery: Secondary | ICD-10-CM | POA: Diagnosis not present

## 2016-05-17 DIAGNOSIS — O365931 Maternal care for other known or suspected poor fetal growth, third trimester, fetus 1: Secondary | ICD-10-CM

## 2016-05-17 NOTE — Progress Notes (Signed)
Subjective:  Cindy Pearson is a 21 y.o. [id number] at 6146w5d being seen today for ongoing prenatal care.  She is currently monitored for the following issues for this high-risk pregnancy and has Late prenatal care affecting pregnancy, antepartum, third trimester and IUGR (intrauterine growth restriction) affecting care of mother, third trimester, fetus 1 on her problem list.  Patient reports no complaints.  Contractions: Irritability. Vag. Bleeding: None.  Movement: Present. Denies leaking of fluid.   The following portions of the patient's history were reviewed and updated as appropriate: allergies, current medications, past family history, past medical history, past social history, past surgical history and problem list. Problem list updated.  Objective:   Vitals:   05/17/16 1117  BP: 101/63  Pulse: 77  Weight: 159 lb (72.1 kg)    Fetal Status: Fetal Heart Rate (bpm): 140   Movement: Present     General:  Alert, oriented and cooperative. Patient is in no acute distress.  Skin: Skin is warm and dry. No rash noted.   Cardiovascular: Normal heart rate noted  Respiratory: Normal respiratory effort, no problems with respiration noted  Abdomen: Soft, gravid, appropriate for gestational age. Pain/Pressure: Present     Pelvic:  Cervical exam deferred        Extremities: Normal range of motion.  Edema: Trace  Mental Status: Normal mood and affect. Normal behavior. Normal judgment and thought content.   Urinalysis:      Assessment and Plan:  Pregnancy: [id number] at 2346w5d  1. Late prenatal care affecting pregnancy, antepartum, third trimester Rx: - Strep Gp B NAA  2. IUGR (intrauterine growth restriction) affecting care of mother, third trimester, fetus 1 - Delivery recommended at 36-37 weeks by MFM because of abnormal UA Dopplers  3. Previous cesarean section complicating pregnancy, antepartum condition or complication   4. Patient desires vaginal birth after cesarean section  (VBAC)   Preterm labor symptoms and general obstetric precautions including but not limited to vaginal bleeding, contractions, leaking of fluid and fetal movement were reviewed in detail with the patient. Please refer to After Visit Summary for other counseling recommendations.  Return in about 1 week (around 05/24/2016) for ROB.   Brock BadHarper, Charles A, MD

## 2016-05-19 LAB — STREP GP B NAA: Strep Gp B NAA: NEGATIVE

## 2016-05-20 ENCOUNTER — Encounter: Payer: Self-pay | Admitting: Obstetrics and Gynecology

## 2016-05-20 ENCOUNTER — Inpatient Hospital Stay (HOSPITAL_COMMUNITY)
Admission: AD | Admit: 2016-05-20 | Discharge: 2016-05-20 | Disposition: A | Discharge status: Home / Self Care | Payer: Medicaid Other | Source: Ambulatory Visit | Attending: Obstetrics and Gynecology | Admitting: Obstetrics and Gynecology

## 2016-05-20 ENCOUNTER — Encounter (HOSPITAL_COMMUNITY): Payer: Self-pay

## 2016-05-20 DIAGNOSIS — O4703 False labor before 37 completed weeks of gestation, third trimester: Secondary | ICD-10-CM | POA: Insufficient documentation

## 2016-05-20 DIAGNOSIS — Z3A36 36 weeks gestation of pregnancy: Secondary | ICD-10-CM | POA: Diagnosis not present

## 2016-05-20 DIAGNOSIS — J45909 Unspecified asthma, uncomplicated: Secondary | ICD-10-CM | POA: Insufficient documentation

## 2016-05-20 DIAGNOSIS — Z880 Allergy status to penicillin: Secondary | ICD-10-CM | POA: Insufficient documentation

## 2016-05-20 DIAGNOSIS — O479 False labor, unspecified: Secondary | ICD-10-CM

## 2016-05-20 DIAGNOSIS — Z87891 Personal history of nicotine dependence: Secondary | ICD-10-CM | POA: Diagnosis not present

## 2016-05-20 DIAGNOSIS — O0933 Supervision of pregnancy with insufficient antenatal care, third trimester: Secondary | ICD-10-CM | POA: Insufficient documentation

## 2016-05-20 NOTE — Progress Notes (Signed)
20 [id number] at 5833w1d with h/o cesarean section followed by VBAC presenting today requesting to be scheduled for a c-section. Patient was scheduled for IOL at 37 weeks secondary to IUGR. Patient desires to have RCS. Risks, benefits were reviewed with the patient. She is clear on her decision for a repeat c-section.  Message sent to surgical scheduler to schedule for cesarean section on 5/20 at 37 weeks

## 2016-05-20 NOTE — MAU Provider Note (Signed)
Patient Cindy Pearson is a 21 y.o. [id number] At [redacted]w[redacted]d Here with complaints of pelvic pressure and abdominal pain. She wants to get checked out because she wants to make sure she is not in labor. She feels positive fetal movements, denies bleeding and leaking of fluid.  History     CSN: [id number]  Arrival date and time: 05/20/16 1307   None     Chief Complaint  Patient presents with  . vaginal pressure  . Nausea   HPI Patient has been feeling irregular contractions for the past week. She has not tried anything for the pain. The contractions are irregular and they feel cramping.  OB History    Gravida Para Term Preterm AB Living   4 2 2  0 1 2   SAB TAB Ectopic Multiple Live Births   1 0 0 0 2      Past Medical History:  Diagnosis Date  . Asthma    Albuterol INH prn  . Chlamydia 2012   hasn't picked up rx, not treated yet  . 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 Preterm labor 2011    Past Surgical History:  Procedure Laterality Date  . CESAREAN SECTION    . TONSILLECTOMY AND ADENOIDECTOMY      Family History  Problem Relation Age of Onset  . Diabetes Maternal Grandmother   . Cancer Maternal Grandmother   . Heart disease Maternal Grandfather   . Heart disease Paternal Grandfather   . Cancer Father     Social History  Substance Use Topics  . Smoking status: Former Games developer  . Smokeless tobacco: Never Used  . Alcohol use No    Allergies:  Allergies  Allergen Reactions  . Penicillins Hives    Patient states that she is allergic to all penicillins. Has patient had a PCN reaction causing immediate rash, facial/tongue/throat swelling, SOB or lightheadedness with hypotension: YES Has patient had a PCN reaction causing severe rash involving mucus membranes or skin necrosis: NO Has patient had a PCN reaction that required hospitalization NO Has patient had a PCN reaction occurring within the last 10 years:NO If all of the above answers are "NO", then may proceed  with Cephalosporin use.    Prescriptions Prior to Admission  Medication Sig Dispense Refill Last Dose  . docusate sodium (COLACE) 100 MG capsule Take 1 capsule (100 mg total) by mouth 2 (two) times daily. (Patient not taking: Reported on 05/08/2016) 60 capsule 5 Not Taking  . Elastic Bandages & Supports (COMFORT FIT MATERNITY SUPP LG) MISC 1 Device by Does not apply route daily. (Patient not taking: Reported on 04/15/2016) 1 each 0 Not Taking  . metroNIDAZOLE (FLAGYL) 500 MG tablet Take 1 tablet (500 mg total) by mouth 2 (two) times daily. (Patient not taking: Reported on 05/08/2016) 14 tablet 2 Not Taking  . Prenat w/o A-FeCbn-Meth-FA-DHA (PRENATE MINI) 29-0.6-0.4-350 MG CAPS Take 1 capsule by mouth daily before breakfast. 90 capsule 3 Taking  . Prenatal Vit-Fe Fumarate-FA (PRENATAL COMPLETE) 14-0.4 MG TABS Take 1 tablet by mouth daily. (Patient not taking: Reported on 04/15/2016) 60 each 5 Not Taking    Review of Systems  Respiratory: Negative.   Cardiovascular: Negative.   Gastrointestinal: Positive for abdominal pain.  Genitourinary: Negative.   Musculoskeletal: Negative.   Neurological: Negative.   Psychiatric/Behavioral: Negative.    Physical Exam   Blood pressure 111/68, pulse 89, temperature 97.5 F (36.4 C), temperature source Oral, resp. rate 16, weight 157 lb 1.9 oz (71.3  kg), last menstrual period 09/10/2015, SpO2 100 %.  Physical Exam  Constitutional: She is oriented to person, place, and time. She appears well-developed and well-nourished.  HENT:  Head: Normocephalic.  Neck: Normal range of motion.  Respiratory: Effort normal.  GI: Soft. Bowel sounds are normal.  Genitourinary: No vaginal discharge found.  Genitourinary Comments: NEFG; patient is soft, 1 cm, posterior. Vertex presentation. No CMT.   Musculoskeletal: Normal range of motion.  Neurological: She is alert and oriented to person, place, and time.  Skin: Skin is warm and dry.  Psychiatric: She has a normal mood  and affect.    MAU Course  Procedures  MDM -NST: 130 bpm, present acels, negative decels, no contractions, moderate variability -patient had uneventful MAU course  Assessment and Plan   1. Braxton Hick's contraction   2. Late prenatal care affecting pregnancy, antepartum, third trimester    3. Patient stable for discharge with recommendations to return to MAU if any bleeding, leaking of fluid or decreased fetal movements. Encouraged water intake to prevent BH contractions.  4. Patient to call [facility] for a follow up visit this week before her induction on Sunday.  Cindy Pearson 05/20/2016, 3:13 PM

## 2016-05-20 NOTE — Discharge Instructions (Signed)

## 2016-05-20 NOTE — MAU Note (Signed)
Patient reporting reports increase in vaginal pressure, difficulty sleeping, and increase in nausea. Denies vaginal bleeding or discharge at this time. +FM.

## 2016-05-21 ENCOUNTER — Encounter (HOSPITAL_COMMUNITY): Payer: Self-pay

## 2016-05-22 ENCOUNTER — Ambulatory Visit (HOSPITAL_COMMUNITY): Admission: RE | Admit: 2016-05-22 | Payer: Medicaid Other | Source: Ambulatory Visit

## 2016-05-24 ENCOUNTER — Encounter (HOSPITAL_COMMUNITY)
Admission: RE | Admit: 2016-05-24 | Discharge: 2016-05-24 | Disposition: A | Discharge status: Home / Self Care | Payer: Medicaid Other | Source: Ambulatory Visit | Attending: Obstetrics & Gynecology | Admitting: Obstetrics & Gynecology

## 2016-05-24 LAB — ABO/RH: ABO/RH(D): B POS

## 2016-05-24 LAB — CBC
HEMATOCRIT: 32.5 % — AB (ref 36.0–46.0)
HEMOGLOBIN: 11 g/dL — AB (ref 12.0–15.0)
MCH: 28.4 pg (ref 26.0–34.0)
MCHC: 33.8 g/dL (ref 30.0–36.0)
MCV: 83.8 fL (ref 78.0–100.0)
Platelets: 125 10*3/uL — ABNORMAL LOW (ref 150–400)
RBC: 3.88 MIL/uL (ref 3.87–5.11)
RDW: 13.5 % (ref 11.5–15.5)
WBC: 5.6 10*3/uL (ref 4.0–10.5)

## 2016-05-24 LAB — TYPE AND SCREEN
ABO/RH(D): B POS
ANTIBODY SCREEN: NEGATIVE

## 2016-05-24 NOTE — Patient Instructions (Signed)
20 Gwynne EdingerKiara M Yow  05/24/2016   Your procedure is scheduled on:  05/26/2016  [facility].  Pick up the phone at the desk and dial [phone] .   Call this number if you have problems the morning of surgery: [phone]   Remember:   Do not eat food:After Midnight.  Do not drink clear liquids: After Midnight.  Take these medicines the morning of surgery with A SIP OF WATER: none   Do not wear jewelry, make-up or nail polish.  Do not wear lotions, powders, or perfumes. Do not wear deodorant.  Do not shave 48 hours prior to surgery.  Do not bring valuables to the hospital.  [facility]   responsible for any belongings or valuables brought to the hospital.  Contacts, dentures or bridgework may not be worn into surgery.  Leave suitcase in the car. After surgery it may be brought to your room.  For patients admitted to the hospital, checkout time is 11:00 AM the day of              discharge.   Patients discharged the day of surgery will not be allowed to drive             home.  Name and phone number of your driver: na  Special Instructions:   N/A   Please read over the following fact sheets that you were given:   Surgical Site Infection Prevention

## 2016-05-25 ENCOUNTER — Other Ambulatory Visit: Payer: Self-pay | Admitting: Obstetrics & Gynecology

## 2016-05-25 LAB — RPR: RPR Ser Ql: NONREACTIVE

## 2016-05-26 ENCOUNTER — Inpatient Hospital Stay (HOSPITAL_COMMUNITY)
Admission: RE | Admit: 2016-05-26 | Discharge: 2016-05-29 | DRG: 765 | Disposition: A | Discharge status: Home / Self Care | Payer: Medicaid Other | Source: Ambulatory Visit | Attending: Obstetrics & Gynecology | Admitting: Obstetrics & Gynecology

## 2016-05-26 ENCOUNTER — Inpatient Hospital Stay (HOSPITAL_COMMUNITY): Admission: RE | Admit: 2016-05-26 | Payer: Medicaid Other | Source: Ambulatory Visit

## 2016-05-26 ENCOUNTER — Inpatient Hospital Stay (HOSPITAL_COMMUNITY): Payer: Medicaid Other | Admitting: Certified Registered Nurse Anesthetist

## 2016-05-26 ENCOUNTER — Encounter (HOSPITAL_COMMUNITY)
Admission: RE | Disposition: A | Discharge status: Home / Self Care | Payer: Self-pay | Source: Ambulatory Visit | Attending: Obstetrics & Gynecology

## 2016-05-26 ENCOUNTER — Encounter (HOSPITAL_COMMUNITY): Payer: Self-pay

## 2016-05-26 DIAGNOSIS — Z98891 History of uterine scar from previous surgery: Secondary | ICD-10-CM

## 2016-05-26 DIAGNOSIS — Z3A37 37 weeks gestation of pregnancy: Secondary | ICD-10-CM

## 2016-05-26 DIAGNOSIS — O365930 Maternal care for other known or suspected poor fetal growth, third trimester, not applicable or unspecified: Secondary | ICD-10-CM | POA: Diagnosis present

## 2016-05-26 DIAGNOSIS — J45909 Unspecified asthma, uncomplicated: Secondary | ICD-10-CM | POA: Diagnosis present

## 2016-05-26 DIAGNOSIS — O9902 Anemia complicating childbirth: Secondary | ICD-10-CM | POA: Diagnosis present

## 2016-05-26 DIAGNOSIS — Z8249 Family history of ischemic heart disease and other diseases of the circulatory system: Secondary | ICD-10-CM | POA: Diagnosis not present

## 2016-05-26 DIAGNOSIS — Z880 Allergy status to penicillin: Secondary | ICD-10-CM | POA: Diagnosis not present

## 2016-05-26 DIAGNOSIS — Z87891 Personal history of nicotine dependence: Secondary | ICD-10-CM | POA: Diagnosis not present

## 2016-05-26 DIAGNOSIS — O34211 Maternal care for low transverse scar from previous cesarean delivery: Secondary | ICD-10-CM | POA: Diagnosis present

## 2016-05-26 DIAGNOSIS — O0933 Supervision of pregnancy with insufficient antenatal care, third trimester: Secondary | ICD-10-CM

## 2016-05-26 DIAGNOSIS — D649 Anemia, unspecified: Secondary | ICD-10-CM | POA: Diagnosis present

## 2016-05-26 DIAGNOSIS — O9952 Diseases of the respiratory system complicating childbirth: Secondary | ICD-10-CM | POA: Diagnosis present

## 2016-05-26 DIAGNOSIS — Z833 Family history of diabetes mellitus: Secondary | ICD-10-CM | POA: Diagnosis not present

## 2016-05-26 LAB — CBC
HCT: 33.1 % — ABNORMAL LOW (ref 36.0–46.0)
HEMOGLOBIN: 11 g/dL — AB (ref 12.0–15.0)
MCH: 27.9 pg (ref 26.0–34.0)
MCHC: 33.2 g/dL (ref 30.0–36.0)
MCV: 84 fL (ref 78.0–100.0)
PLATELETS: 120 10*3/uL — AB (ref 150–400)
RBC: 3.94 MIL/uL (ref 3.87–5.11)
RDW: 13.5 % (ref 11.5–15.5)
WBC: 8.4 10*3/uL (ref 4.0–10.5)

## 2016-05-26 SURGERY — Surgical Case
Anesthesia: Spinal

## 2016-05-26 MED ORDER — LACTATED RINGERS IV SOLN
INTRAVENOUS | Status: DC
  Administered 2016-05-26 (×3): via INTRAVENOUS

## 2016-05-26 MED ORDER — METHYLERGONOVINE MALEATE 0.2 MG/ML IJ SOLN: 0.2000 mg | INTRAMUSCULAR | Status: DC | PRN

## 2016-05-26 MED ORDER — BUPIVACAINE IN DEXTROSE 0.75-8.25 % IT SOLN
INTRATHECAL | Status: DC | PRN
  Administered 2016-05-26: 1.2 mL via INTRATHECAL

## 2016-05-26 MED ORDER — SIMETHICONE 80 MG PO CHEW
80.0000 mg | CHEWABLE_TABLET | ORAL | Status: DC | PRN
  Administered 2016-05-26 – 2016-05-27 (×3): 80 mg via ORAL
  Filled 2016-05-26 (×2): qty 1

## 2016-05-26 MED ORDER — MORPHINE SULFATE (PF) 0.5 MG/ML IJ SOLN
INTRAMUSCULAR | Status: DC | PRN
  Administered 2016-05-26: .1 mg via INTRATHECAL

## 2016-05-26 MED ORDER — MENTHOL 3 MG MT LOZG: 1.0000 | LOZENGE | OROMUCOSAL | Status: DC | PRN

## 2016-05-26 MED ORDER — IBUPROFEN 600 MG PO TABS
600.0000 mg | ORAL_TABLET | Freq: Four times a day (QID) | ORAL | Status: DC
  Administered 2016-05-26 – 2016-05-29 (×12): 600 mg via ORAL
  Filled 2016-05-26 (×12): qty 1

## 2016-05-26 MED ORDER — PHENYLEPHRINE 8 MG IN D5W 100 ML (0.08MG/ML) PREMIX OPTIME
INJECTION | INTRAVENOUS | Status: AC
  Filled 2016-05-26: qty 100

## 2016-05-26 MED ORDER — FENTANYL CITRATE (PF) 100 MCG/2ML IJ SOLN
INTRAMUSCULAR | Status: DC | PRN
  Administered 2016-05-26: 25 ug via INTRATHECAL

## 2016-05-26 MED ORDER — SCOPOLAMINE 1 MG/3DAYS TD PT72: 1.0000 | MEDICATED_PATCH | Freq: Once | TRANSDERMAL | Status: DC | PRN

## 2016-05-26 MED ORDER — OXYCODONE HCL 5 MG PO TABS
5.0000 mg | ORAL_TABLET | ORAL | Status: DC | PRN
  Administered 2016-05-27: 5 mg via ORAL
  Administered 2016-05-27 (×2): 10 mg via ORAL
  Administered 2016-05-28 (×3): 5 mg via ORAL
  Filled 2016-05-26 (×4): qty 1
  Filled 2016-05-26 (×2): qty 2

## 2016-05-26 MED ORDER — FENTANYL CITRATE (PF) 100 MCG/2ML IJ SOLN
INTRAMUSCULAR | Status: AC
  Filled 2016-05-26: qty 2

## 2016-05-26 MED ORDER — COCONUT OIL OIL
1.0000 "application " | TOPICAL_OIL | Status: DC | PRN
  Administered 2016-05-29: 1 via TOPICAL
  Filled 2016-05-26: qty 120

## 2016-05-26 MED ORDER — NALBUPHINE HCL 10 MG/ML IJ SOLN
5.0000 mg | Freq: Once | INTRAMUSCULAR | Status: AC | PRN
  Administered 2016-05-26: 5 mg via SUBCUTANEOUS

## 2016-05-26 MED ORDER — SIMETHICONE 80 MG PO CHEW
80.0000 mg | CHEWABLE_TABLET | Freq: Three times a day (TID) | ORAL | Status: DC
  Administered 2016-05-27 – 2016-05-29 (×6): 80 mg via ORAL
  Filled 2016-05-26 (×6): qty 1

## 2016-05-26 MED ORDER — NALOXONE HCL 0.4 MG/ML IJ SOLN: 0.4000 mg | INTRAMUSCULAR | Status: DC | PRN

## 2016-05-26 MED ORDER — PRENATAL MULTIVITAMIN CH
1.0000 | ORAL_TABLET | Freq: Every day | ORAL | Status: DC
  Administered 2016-05-27 – 2016-05-29 (×3): 1 via ORAL
  Filled 2016-05-26 (×3): qty 1

## 2016-05-26 MED ORDER — OXYTOCIN 10 UNIT/ML IJ SOLN
INTRAMUSCULAR | Status: AC
  Filled 2016-05-26: qty 4

## 2016-05-26 MED ORDER — PANTOPRAZOLE SODIUM 40 MG PO TBEC: 40.0000 mg | DELAYED_RELEASE_TABLET | Freq: Once | ORAL | Status: DC

## 2016-05-26 MED ORDER — TETANUS-DIPHTH-ACELL PERTUSSIS 5-2.5-18.5 LF-MCG/0.5 IM SUSP: 0.5000 mL | Freq: Once | INTRAMUSCULAR | Status: DC

## 2016-05-26 MED ORDER — ONDANSETRON HCL 4 MG/2ML IJ SOLN
INTRAMUSCULAR | Status: AC
  Filled 2016-05-26: qty 2

## 2016-05-26 MED ORDER — DIPHENHYDRAMINE HCL 25 MG PO CAPS: 25.0000 mg | ORAL_CAPSULE | Freq: Four times a day (QID) | ORAL | Status: DC | PRN

## 2016-05-26 MED ORDER — NALBUPHINE HCL 10 MG/ML IJ SOLN: 5.0000 mg | INTRAMUSCULAR | Status: DC | PRN

## 2016-05-26 MED ORDER — PRENATE MINI 18-0.6-0.4-350 MG PO CAPS: 1.0000 | ORAL_CAPSULE | Freq: Every day | ORAL | Status: DC

## 2016-05-26 MED ORDER — ACETAMINOPHEN 160 MG/5ML PO SOLN: 975.0000 mg | Freq: Once | ORAL | Status: DC

## 2016-05-26 MED ORDER — BUPIVACAINE IN DEXTROSE 0.75-8.25 % IT SOLN
INTRATHECAL | Status: AC
  Filled 2016-05-26: qty 2

## 2016-05-26 MED ORDER — METHYLERGONOVINE MALEATE 0.2 MG PO TABS: 0.2000 mg | ORAL_TABLET | ORAL | Status: DC | PRN

## 2016-05-26 MED ORDER — SIMETHICONE 80 MG PO CHEW
80.0000 mg | CHEWABLE_TABLET | ORAL | Status: DC
  Administered 2016-05-26 – 2016-05-28 (×3): 80 mg via ORAL
  Filled 2016-05-26 (×3): qty 1

## 2016-05-26 MED ORDER — LACTATED RINGERS IV SOLN
INTRAVENOUS | Status: DC
  Administered 2016-05-26 – 2016-05-27 (×2): via INTRAVENOUS

## 2016-05-26 MED ORDER — ONDANSETRON HCL 4 MG/2ML IJ SOLN
INTRAMUSCULAR | Status: DC | PRN
  Administered 2016-05-26: 4 mg via INTRAVENOUS

## 2016-05-26 MED ORDER — DIPHENHYDRAMINE HCL 50 MG/ML IJ SOLN: 12.5000 mg | INTRAMUSCULAR | Status: DC | PRN

## 2016-05-26 MED ORDER — FAMOTIDINE 20 MG PO TABS: 20.0000 mg | ORAL_TABLET | Freq: Once | ORAL | Status: DC | PRN

## 2016-05-26 MED ORDER — SODIUM CHLORIDE 0.9% FLUSH: 3.0000 mL | INTRAVENOUS | Status: DC | PRN

## 2016-05-26 MED ORDER — LACTATED RINGERS IV SOLN
INTRAVENOUS | Status: DC
  Administered 2016-05-26: 13:00:00 via INTRAVENOUS

## 2016-05-26 MED ORDER — ERYTHROMYCIN 5 MG/GM OP OINT
TOPICAL_OINTMENT | OPHTHALMIC | Status: AC
  Filled 2016-05-26: qty 1

## 2016-05-26 MED ORDER — HYDROMORPHONE HCL 1 MG/ML IJ SOLN
0.2500 mg | INTRAMUSCULAR | Status: DC | PRN
  Administered 2016-05-26: 0.5 mg via INTRAVENOUS
  Administered 2016-05-26: 0.25 mg via INTRAVENOUS

## 2016-05-26 MED ORDER — HYDROMORPHONE HCL 1 MG/ML IJ SOLN
INTRAMUSCULAR | Status: AC
  Filled 2016-05-26: qty 1

## 2016-05-26 MED ORDER — HYDROMORPHONE HCL 1 MG/ML IJ SOLN
INTRAMUSCULAR | Status: AC
  Administered 2016-05-26: 0.25 mg via INTRAVENOUS
  Filled 2016-05-26: qty 1

## 2016-05-26 MED ORDER — GENTAMICIN SULFATE 40 MG/ML IJ SOLN
INTRAVENOUS | Status: AC
  Administered 2016-05-26: 100 mL via INTRAVENOUS
  Filled 2016-05-26: qty 7.25

## 2016-05-26 MED ORDER — LACTATED RINGERS IV SOLN
INTRAVENOUS | Status: DC | PRN
  Administered 2016-05-26: 40 [IU] via INTRAVENOUS

## 2016-05-26 MED ORDER — DIPHENHYDRAMINE HCL 25 MG PO CAPS
25.0000 mg | ORAL_CAPSULE | ORAL | Status: DC | PRN
  Filled 2016-05-26: qty 1

## 2016-05-26 MED ORDER — DIBUCAINE 1 % RE OINT: 1.0000 "application " | TOPICAL_OINTMENT | RECTAL | Status: DC | PRN

## 2016-05-26 MED ORDER — SCOPOLAMINE 1 MG/3DAYS TD PT72: 1.0000 | MEDICATED_PATCH | Freq: Once | TRANSDERMAL | Status: DC

## 2016-05-26 MED ORDER — MEPERIDINE HCL 25 MG/ML IJ SOLN: 6.2500 mg | INTRAMUSCULAR | Status: DC | PRN

## 2016-05-26 MED ORDER — NALOXONE HCL 2 MG/2ML IJ SOSY
1.0000 ug/kg/h | PREFILLED_SYRINGE | INTRAVENOUS | Status: DC | PRN
  Filled 2016-05-26: qty 2

## 2016-05-26 MED ORDER — NALBUPHINE HCL 10 MG/ML IJ SOLN
INTRAMUSCULAR | Status: AC
  Administered 2016-05-26: 5 mg via SUBCUTANEOUS
  Filled 2016-05-26: qty 1

## 2016-05-26 MED ORDER — MORPHINE SULFATE (PF) 0.5 MG/ML IJ SOLN
INTRAMUSCULAR | Status: AC
  Filled 2016-05-26: qty 10

## 2016-05-26 MED ORDER — NALBUPHINE HCL 10 MG/ML IJ SOLN: 5.0000 mg | Freq: Once | INTRAMUSCULAR | Status: AC | PRN

## 2016-05-26 MED ORDER — ACETAMINOPHEN 500 MG PO TABS
1000.0000 mg | ORAL_TABLET | Freq: Four times a day (QID) | ORAL | Status: AC
  Administered 2016-05-26 – 2016-05-27 (×3): 1000 mg via ORAL
  Filled 2016-05-26 (×3): qty 2

## 2016-05-26 MED ORDER — OXYTOCIN 40 UNITS IN LACTATED RINGERS INFUSION - SIMPLE MED: 2.5000 [IU]/h | INTRAVENOUS | Status: AC

## 2016-05-26 MED ORDER — METOCLOPRAMIDE HCL 10 MG PO TABS: 10.0000 mg | ORAL_TABLET | Freq: Once | ORAL | Status: DC | PRN

## 2016-05-26 MED ORDER — WITCH HAZEL-GLYCERIN EX PADS: 1.0000 "application " | MEDICATED_PAD | CUTANEOUS | Status: DC | PRN

## 2016-05-26 MED ORDER — ACETAMINOPHEN 325 MG PO TABS
650.0000 mg | ORAL_TABLET | ORAL | Status: DC | PRN
  Administered 2016-05-29: 650 mg via ORAL
  Filled 2016-05-26 (×2): qty 2

## 2016-05-26 MED ORDER — SENNOSIDES-DOCUSATE SODIUM 8.6-50 MG PO TABS
2.0000 | ORAL_TABLET | ORAL | Status: DC
  Administered 2016-05-26 – 2016-05-28 (×3): 2 via ORAL
  Filled 2016-05-26 (×3): qty 2

## 2016-05-26 MED ORDER — SODIUM CHLORIDE 0.9 % IR SOLN
Status: DC | PRN
  Administered 2016-05-26: 1000 mL

## 2016-05-26 MED ORDER — PHENYLEPHRINE 8 MG IN D5W 100 ML (0.08MG/ML) PREMIX OPTIME
INJECTION | INTRAVENOUS | Status: DC | PRN
  Administered 2016-05-26: 60 ug/min via INTRAVENOUS

## 2016-05-26 MED ORDER — ZOLPIDEM TARTRATE 5 MG PO TABS: 5.0000 mg | ORAL_TABLET | Freq: Every evening | ORAL | Status: DC | PRN

## 2016-05-26 MED ORDER — ONDANSETRON HCL 4 MG/2ML IJ SOLN: 4.0000 mg | Freq: Three times a day (TID) | INTRAMUSCULAR | Status: DC | PRN

## 2016-05-26 SURGICAL SUPPLY — 36 items

## 2016-05-26 NOTE — H&P (Signed)
[name] is a [age] y.o. female 5477w0d Estimated Date of Delivery: 06/16/16 presenting for repeat Caesarean section Declines trial of labor This pregnancy has been complicated by fetal growth restriciton with episodic fetal Doppler ratio abnormalities, as a result admitted for delivery at 4277w0d  . OB History    Gravida Para Term Preterm AB Living   4 2 2  0 1 2   SAB TAB Ectopic Multiple Live Births   1 0 0 0 2     Past Medical History:  Diagnosis Date  . Asthma    Albuterol INH prn  . Chlamydia 2012   hasn't picked up rx, not treated yet  . 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 Preterm labor 2011   Past Surgical History:  Procedure Laterality Date  . CESAREAN SECTION    . TONSILLECTOMY AND ADENOIDECTOMY     Family History: family history includes Cancer in her father and maternal grandmother; Diabetes in her maternal grandmother; Heart disease in her maternal grandfather and paternal grandfather. Social History:  reports that she has quit smoking. She has never used smokeless tobacco. She reports that she does not drink alcohol or use drugs.     Maternal Diabetes: No Genetic Screening: Normal Maternal Ultrasounds/Referrals: Abnormal:  Findings:   IUGR Fetal Ultrasounds or other Referrals:  Referred to Materal Fetal Medicine  Maternal Substance Abuse:  No Significant Maternal Medications:  None Significant Maternal Lab Results:  None Other Comments:  None  ROS   Review of Systems  Constitutional: Negative for fever, chills, weight loss, malaise/fatigue and diaphoresis.  HENT: Negative for hearing loss, ear pain, nosebleeds, congestion, sore throat, neck pain, tinnitus and ear discharge.   Eyes: Negative for blurred vision, double vision, photophobia, pain, discharge and redness.  Respiratory: Negative for cough, hemoptysis, sputum production, shortness of breath, wheezing and stridor.   Cardiovascular: Negative for chest pain, palpitations, orthopnea, claudication,  leg swelling and PND.  Gastrointestinal: neagtive for abdominal pain. Negative for heartburn, nausea, vomiting, diarrhea, constipation, blood in stool and melena.  Genitourinary: Negative for dysuria, urgency, frequency, hematuria and flank pain.  Musculoskeletal: Negative for myalgias, back pain, joint pain and falls.  Skin: Negative for itching and rash.  Neurological: Negative for dizziness, tingling, tremors, sensory change, speech change, focal weakness, seizures, loss of consciousness, weakness and headaches.  Endo/Heme/Allergies: Negative for environmental allergies and polydipsia. Does not bruise/bleed easily.  Psychiatric/Behavioral: Negative for depression, suicidal ideas, hallucinations, memory loss and substance abuse. The patient is not nervous/anxious and does not have insomnia.      History  Past Medical History:  Diagnosis Date  . Asthma    Albuterol INH prn  . Chlamydia 2012   hasn't picked up rx, not treated yet  . 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 Preterm labor 2011    Past Surgical History:  Procedure Laterality Date  . CESAREAN SECTION    . TONSILLECTOMY AND ADENOIDECTOMY      OB History    Gravida Para Term Preterm AB Living   4 2 2  0 1 2   SAB TAB Ectopic Multiple Live Births   1 0 0 0 2      Allergies  Allergen Reactions  . Penicillins Hives    Patient states that she is allergic to all penicillins. Has patient had a PCN reaction causing immediate rash, facial/tongue/throat swelling, SOB or lightheadedness with hypotension: YES Has patient had a PCN reaction causing severe rash involving  mucus membranes or skin necrosis: NO Has patient had a PCN reaction that required hospitalization NO Has patient had a PCN reaction occurring within the last 10 years:NO If all of the above answers are "NO", then may proceed with Cephalosporin use.    Social History   Social History  . Marital status: Single    Spouse name: N/A  . Number of children: 2   . Years of education: N/A   Occupational History  . [employer]    Social History Main Topics  . Smoking status: Former Games developer  . Smokeless tobacco: Never Used  . Alcohol use No  . Drug use: No  . Sexual activity: Yes    Partners: Female    Birth control/ protection: None     Comment: 1 partner   Other Topics Concern  . None   Social History Narrative  . None    Family History  Problem Relation Age of Onset  . Diabetes Maternal Grandmother   . Cancer Maternal Grandmother   . Heart disease Maternal Grandfather   . Heart disease Paternal Grandfather   . Cancer Father       Blood pressure 113/75, pulse 76, temperature 98.4 F (36.9 C), temperature source Oral, resp. rate 16, last menstrual period 09/10/2015. Exam Physical Exam  Prenatal labs: ABO, Rh: --/--/B POS (05/18 1010) Antibody: NEG (05/18 1005) Rubella: 1.93 (04/26 1556) RPR: Non Reactive (05/18 1005)  HBsAg: Negative (04/26 1556)  HIV: Non Reactive (04/26 1556)  GBS: Negative (05/11 1152)   Assessment/Plan: [redacted]w[redacted]d Estimated Date of Delivery: 06/16/16 with fetal growth restriciton and elevated Doppler flow ratios Previous Caesarean section, declines trial of labor  Repeat Caesraean section   [name] H 05/26/2016, 11:11 AM

## 2016-05-26 NOTE — Anesthesia Preprocedure Evaluation (Signed)
Anesthesia Evaluation  Patient identified by MRN, date of birth, ID band Patient awake    Reviewed: Allergy & Precautions, H&P , Patient's Chart, lab work & pertinent test results  Airway Mallampati: II  TM Distance: >3 FB Neck ROM: full    Dental no notable dental hx.    Pulmonary former smoker,    Pulmonary exam normal breath sounds clear to auscultation       Cardiovascular Exercise Tolerance: Good  Rhythm:regular Rate:Normal     Neuro/Psych    GI/Hepatic   Endo/Other    Renal/GU      Musculoskeletal   Abdominal   Peds  Hematology   Anesthesia Other Findings   Reproductive/Obstetrics                             Anesthesia Physical Anesthesia Plan  ASA: II  Anesthesia Plan: Spinal   Post-op Pain Management:    Induction:   Airway Management Planned:   Additional Equipment:   Intra-op Plan:   Post-operative Plan:   Informed Consent: I have reviewed the patients History and Physical, chart, labs and discussed the procedure including the risks, benefits and alternatives for the proposed anesthesia with the patient or authorized representative who has indicated his/her understanding and acceptance.     Plan Discussed with:   Anesthesia Plan Comments: (  )        Anesthesia Quick Evaluation

## 2016-05-26 NOTE — Anesthesia Procedure Notes (Signed)
Spinal  Patient location during procedure: OR Staffing Anesthesiologist: Cristela BlueJACKSON, Tanya Marvin Preanesthetic Checklist Completed: patient identified, site marked, surgical consent, pre-op evaluation, timeout performed, IV checked, risks and benefits discussed and monitors and equipment checked Spinal Block Patient position: sitting Prep: DuraPrep Patient monitoring: heart rate, cardiac monitor, continuous pulse ox and blood pressure Approach: midline Location: L3-4 Injection technique: single-shot Needle Needle type: Sprotte  Needle gauge: 24 G Needle length: 9 cm Assessment Sensory level: T4 Additional Notes Spinal Dosage in OR  .75% Bupivicaine ml       1.2     [id number]   mcg        100    Fentanyl mcg            25

## 2016-05-26 NOTE — Lactation Note (Signed)
This note was copied from a baby's chart. Lactation Consultation Note  Patient Name: Cindy Pearson ZOXWR'UToday's Date: 05/26/2016 Reason for consult: Initial assessment;Infant < 6lbs  Baby 8 hours old. Baby getting first bath. Mom reports that baby has been sleepy at the breast since birth. Reviewed LPI sheet with mom and enc beginning use of DEBP after she holds the baby STS post-bath. Mom has well-everted nipples and reports that she had milk with first child, but was you and could not continue BF. Enc mom to put baby to breast with cues, and at least every 3 hours. Enc mom to supplement baby with EBM/formula according to supplementation guidelines, which were given with review. Enc mom to post-pump followed by hand expression. Mom reports that she is already flowing, but just could not get the baby interested to latch. Brought DEBP and supplies into the room, and discussed with mom that her bedside nurse would help her start pumping.   Discussed assessment and interventions with patient's bedside nurse, Dava, RN.   Maternal Data    Feeding Feeding Type: Breast Fed Length of feed: 0 min  LATCH Score/Interventions                      Lactation Tools Discussed/Used     Consult Status Consult Status: Follow-up Date: 05/27/16 Follow-up type: In-patient    Sherlyn HayJennifer D Jayci Ellefson 05/26/2016, 10:32 PM

## 2016-05-26 NOTE — Op Note (Signed)
Preoperative diagnosis:  1.  Intrauterine pregnancy at 5734w0d  weeks gestation                                         2.  Fetal Growth Restriction                                         3.  Elevated Doppler ratios                                         4.  Previous Caesarean section   Postoperative diagnosis:  Same as above plus funic cord presentation  Procedure:  Repeat cesarean section  Surgeon:  Lazaro ArmsLuther H Zadrian Mccauley MD  Assistant:    Anesthesia: Spinal  Findings:  .    Over a low transverse incision was delivered a viable female with Apgars of 9 and 9 weighing pending lbs.  oz. Uterus, tubes and ovaries were all normal.  There were no other significant findings  Description of operation:  Patient was taken to the operating room and placed in the sitting position where she underwent a spinal anesthetic. She was then placed in the supine position with tilt to the left side. When adequate anesthetic level was obtained she was prepped and draped in usual sterile fashion and a Foley catheter was placed. A Pfannenstiel skin incision was made and carried down sharply to the rectus fascia which was scored in the midline extended laterally. The fascia was taken off the muscles both superiorly and without difficulty. The muscles were divided.  The peritoneal cavity was entered.  Bladder blade was placed, no bladder flap was created.  A low transverse hysterotomy incision was made and delivered a viable female  infant at [id number] with Apgars of 9 and 9 weighingpending lbs  oz.  Cord pH was obtained and was pending. The uterus was exteriorized. It was closed in 2 layers, the first being a running interlocking layer and the second being an imbricating layer using 0 monocryl on a CTX needle. There was good resulting hemostasis. The uterus tubes and ovaries were all normal. Peritoneal cavity was irrigated vigorously. The muscles and peritoneum were reapproximated loosely. The fascia was closed using 0 Vicryl in running  fashion. Subcutaneous tissue was made hemostatic and irrigated. The skin was closed using 4-0 Vicryl on a Keith needle in a subcuticular fashion.  Dermabond was placed for additional wound integrity and to serve as a barrier. Blood loss for the procedure was 500 cc. The patient received a gentamicin and clindamycin prophylactically. The patient was taken to the recovery room in good stable condition with all counts being correct x3.  EBL 500 cc  Kurtis Anastasia H 05/26/2016 1:40 PM

## 2016-05-26 NOTE — Anesthesia Postprocedure Evaluation (Signed)
Anesthesia Post Note  Patient: Cindy Pearson  Procedure(s) Performed: Procedure(s) (LRB): CESAREAN SECTION (N/A)  Patient location during evaluation: PACU Anesthesia Type: Spinal Level of consciousness: awake Pain management: satisfactory to patient Vital Signs Assessment: post-procedure vital signs reviewed and stable Respiratory status: spontaneous breathing Cardiovascular status: blood pressure returned to baseline Postop Assessment: no headache and spinal receding Anesthetic complications: no        Last Vitals:  Vitals:   05/26/16 1930 05/26/16 2344  BP:  114/67  Pulse:  77  Resp:  18  Temp: 36.9 C 37.1 C    Last Pain:  Vitals:   05/26/16 2340  TempSrc:   PainSc: 8    Pain Goal: Patients Stated Pain Goal: 0 (05/26/16 1839)               Jiles GarterJACKSON,Cindy Pearson

## 2016-05-26 NOTE — Transfer of Care (Signed)
Immediate Anesthesia Transfer of Care Note  Patient: Cindy Pearson  Procedure(s) Performed: Procedure(s): CESAREAN SECTION (N/A)  Patient Location: PACU  Anesthesia Type:Spinal  Level of Consciousness: awake, alert  and oriented  Airway & Oxygen Therapy: Patient Spontanous Breathing  Post-op Assessment: Report given to RN and Post -op Vital signs reviewed and stable  Post vital signs: Reviewed and stable  Last Vitals:  Vitals:   05/26/16 1018 05/26/16 1234  BP: 113/75   Pulse: 76   Resp: 16   Temp: 36.9 C 36.7 C    Last Pain:  Vitals:   05/26/16 1234  TempSrc: Oral  PainSc:          Complications: No apparent anesthesia complications

## 2016-05-27 ENCOUNTER — Encounter (HOSPITAL_COMMUNITY): Payer: Self-pay | Admitting: *Deleted

## 2016-05-27 LAB — CBC
HEMATOCRIT: 24.3 % — AB (ref 36.0–46.0)
Hemoglobin: 8.3 g/dL — ABNORMAL LOW (ref 12.0–15.0)
MCH: 28.4 pg (ref 26.0–34.0)
MCHC: 34.2 g/dL (ref 30.0–36.0)
MCV: 83.2 fL (ref 78.0–100.0)
PLATELETS: 136 10*3/uL — AB (ref 150–400)
RBC: 2.92 MIL/uL — ABNORMAL LOW (ref 3.87–5.11)
RDW: 13.4 % (ref 11.5–15.5)
WBC: 7 10*3/uL (ref 4.0–10.5)

## 2016-05-27 MED ORDER — SODIUM CHLORIDE 0.9 % IV BOLUS (SEPSIS): 1000.0000 mL | Freq: Once | INTRAVENOUS | Status: DC

## 2016-05-27 MED ORDER — BISACODYL 10 MG RE SUPP
10.0000 mg | Freq: Once | RECTAL | Status: AC
  Administered 2016-05-27: 10 mg via RECTAL
  Filled 2016-05-27: qty 1
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
## 2016-05-27 NOTE — Lactation Note (Signed)
This note was copied from a baby's chart. Lactation Consultation Note  Patient Name: Cindy Pearson ZOXWR'UToday's Date: 05/27/2016 Reason for consult: Follow-up assessment  Follow up visit at 27 hours of age.  Mom reports recent feeding for about 45 minutes at both breasts.  Support person changed large green stool.  Baby is 6776w0d at 5#1oz with 4% weight loss at 16 hours of age post c/s delivery. Baby has had LATCH scores of "8-9" and has not started to supplement with EBM yes.  Mom reports pumping X2 with colostrum in fridge.   LC offered to work with hand expression and offer spoon feeding.  Mom agreeable and hand expressed 4mls. LC spoon fed with Support person assisting. Baby tolerated about 5mls well, but sleepy.  Mom to use DEBP and more hand expression to save at bedside for next feeding.   Plan is for mom to wake baby 2 1/2-3 hours, breast feed and supplement with EBM per guidelines of 10-4220mls for hours of age.  Mom encouraged to latch and supplement in 30 minutes feeding to conserve baby's energy.  Mom needs guidance to follow LPT feeding policy.  LC reviewed LPT behavior.  Mom to call as needed.    Maternal Data Has patient been taught Hand Expression?: Yes  Feeding Feeding Type: Breast Fed  LATCH Score/Interventions Latch: Repeated attempts needed to sustain latch, nipple held in mouth throughout feeding, stimulation needed to elicit sucking reflex. Intervention(s): Adjust position;Assist with latch  Audible Swallowing: A few with stimulation Intervention(s): Skin to skin  Type of Nipple: Everted at rest and after stimulation  Comfort (Breast/Nipple): Soft / non-tender     Hold (Positioning): No assistance needed to correctly position infant at breast.  LATCH Score: 8  Lactation Tools Discussed/Used WIC Program: Yes   Consult Status Consult Status: Follow-up Date: 05/28/16 Follow-up type: In-patient    Beverely RisenShoptaw, Arvella MerlesJana Lynn 05/27/2016, 4:57 PM

## 2016-05-27 NOTE — Anesthesia Postprocedure Evaluation (Signed)
Anesthesia Post Note  Patient: Cindy Pearson  Procedure(s) Performed: Procedure(s) (LRB): CESAREAN SECTION (N/A)  Patient location during evaluation: Mother Baby Anesthesia Type: Spinal Level of consciousness: awake and alert and oriented Pain management: pain level controlled Vital Signs Assessment: post-procedure vital signs reviewed and stable Respiratory status: spontaneous breathing and nonlabored ventilation Cardiovascular status: stable Postop Assessment: no headache, no backache, spinal receding, patient able to bend at knees, no signs of nausea or vomiting and adequate PO intake Anesthetic complications: no        Last Vitals:  Vitals:   05/27/16 0330 05/27/16 0628  BP: (!) 90/48 108/65  Pulse: 65 82  Resp:    Temp: 36.9 C 37 C    Last Pain:  Vitals:   05/27/16 0800  TempSrc:   PainSc: 5    Pain Goal: Patients Stated Pain Goal: 0 (05/26/16 1839)               Laban EmperorMalinova,Kathlyne Loud Hristova

## 2016-05-27 NOTE — Addendum Note (Signed)
Addendum  created 05/27/16 1057 by Elgie CongoMalinova, Traivon Morrical H, CRNA   Sign clinical note

## 2016-05-27 NOTE — Progress Notes (Signed)
Pt encouraged to ambulate and empty bladder. Pt was only able to void 125. Bladder Scan showed 498. In and out straight catheter for only 25 of clear yellow urine. Bladder was re-scanned for 30ml. Dr. Twanna HyFeung notified and orders received. Encouraged po fluids. Lucy ChrisJaime Yvonne Stopher, RN

## 2016-05-27 NOTE — Progress Notes (Signed)
Subjective: Postpartum Day 1: Cesarean Delivery Patient reports incisional pain and tolerating PO.    Objective: Vital signs in last 24 hours: Temp:  [96.8 F (36 C)-98.8 F (37.1 C)] 98.6 F (37 C) (05/21 0628) Pulse Rate:  [58-82] 82 (05/21 0628) Resp:  [15-28] 18 (05/20 2344) BP: (90-119)/(48-78) 108/65 (05/21 0628) SpO2:  [99 %-100 %] 100 % (05/21 19140628)  Physical Exam:  General: alert, cooperative and no distress Lochia: appropriate Uterine Fundus: firm Incision: healing well, no significant drainage DVT Evaluation: No evidence of DVT seen on physical exam.   Recent Labs  05/26/16 1015 05/27/16 0548  HGB 11.0* 8.3*  HCT 33.1* 24.3*    Assessment/Plan: Status post Cesarean section. Doing well postoperatively.  Continue current care.  Wynelle BourgeoisMarie Williams 05/27/2016, 7:10 AM

## 2016-05-28 LAB — CBC
HCT: 20.2 % — ABNORMAL LOW (ref 36.0–46.0)
HCT: 18.1 % — ABNORMAL LOW (ref 36.0–46.0)
HCT: 26.3 % — ABNORMAL LOW (ref 36.0–46.0)
HEMOGLOBIN: 6.8 g/dL — AB (ref 12.0–15.0)
HEMOGLOBIN: 6.2 g/dL — AB (ref 12.0–15.0)
Hemoglobin: 9 g/dL — ABNORMAL LOW (ref 12.0–15.0)
MCH: 28 pg (ref 26.0–34.0)
MCH: 28.3 pg (ref 26.0–34.0)
MCH: 28 pg (ref 26.0–34.0)
MCHC: 33.7 g/dL (ref 30.0–36.0)
MCHC: 34.3 g/dL (ref 30.0–36.0)
MCHC: 34.2 g/dL (ref 30.0–36.0)
MCV: 83.1 fL (ref 78.0–100.0)
MCV: 82.6 fL (ref 78.0–100.0)
MCV: 81.9 fL (ref 78.0–100.0)
PLATELETS: 124 10*3/uL — AB (ref 150–400)
Platelets: 141 10*3/uL — ABNORMAL LOW (ref 150–400)
Platelets: 139 10*3/uL — ABNORMAL LOW (ref 150–400)
RBC: 2.43 MIL/uL — AB (ref 3.87–5.11)
RBC: 2.19 MIL/uL — ABNORMAL LOW (ref 3.87–5.11)
RBC: 3.21 MIL/uL — AB (ref 3.87–5.11)
RDW: 13.3 % (ref 11.5–15.5)
RDW: 13.5 % (ref 11.5–15.5)
RDW: 15.5 % (ref 11.5–15.5)
WBC: 9.6 10*3/uL (ref 4.0–10.5)
WBC: 7.5 10*3/uL (ref 4.0–10.5)
WBC: 8.7 10*3/uL (ref 4.0–10.5)

## 2016-05-28 LAB — PREPARE RBC (CROSSMATCH)

## 2016-05-28 MED ORDER — POLYETHYLENE GLYCOL 3350 17 G PO PACK
17.0000 g | PACK | Freq: Every day | ORAL | Status: DC | PRN
  Administered 2016-05-28: 17 g via ORAL
  Filled 2016-05-28 (×2): qty 1

## 2016-05-28 MED ORDER — SODIUM CHLORIDE 0.9 % IV SOLN
Freq: Once | INTRAVENOUS | Status: AC
  Administered 2016-05-28: 13:00:00 via INTRAVENOUS

## 2016-05-28 MED ORDER — FLEET ENEMA 7-19 GM/118ML RE ENEM
1.0000 | ENEMA | Freq: Once | RECTAL | Status: AC
  Administered 2016-05-28: 1 via RECTAL

## 2016-05-28 NOTE — Progress Notes (Signed)
POSTPARTUM PROGRESS NOTE  Post Partum Day #2  Subjective:  Cindy Pearson is a 21 y.o. [id number] 2548w0d POD#2 s/p cesarean delivery.  No acute events overnight.  Pt denies problems with ambulating, voiding or po intake. Of note she had decreased urination and dizziness with ambulation yesterday which she states resolved with 1000 ml bolus.    She denies nausea or vomiting.  Pain is well controlled.  She has had flatus. She has not had bowel movement.  Lochia Minimal. States no lochia this AM.  Objective: Blood pressure 110/62, pulse 77, temperature 98.6 F (37 C), temperature source Axillary, resp. rate 18, height 5\' 1"  (1.549 m), weight 158 lb (71.7 kg), last menstrual period 09/10/2015, SpO2 100 %, unknown if currently breastfeeding.  Physical Exam:  General: alert, cooperative and no distress Lochia:normal flow Chest: CTAB  Heart: RRR +2/5 systolic murmur Abdomen: +BS, soft, nontender, incision is clean/dry/intact, honeycomb in place Uterine Fundus: firm, at umbilicus DVT Evaluation: No calf swelling or tenderness Extremities: no edema   Recent Labs  05/27/16 1229 05/28/16 0528  HGB 6.8* 6.2*  HCT 20.2* 18.1*    Assessment/Plan:  ASSESSMENT: Cindy Pearson is a 21 y.o. [id number] 5048w0d day #2 s/p repeat cesarean delivery.  Symptomatic Anemia - Hgb 6.2 this AM from 6.8 yesterday, with new systolic murmur.No dyspnea.  Dizziness and fatigue have resolved, however would be reasonable to give PRBC this AM for symptomatic anemia - no signs of bleeding; no abdominal or back pain, minimal lochia - Transfuse 2u PRBC - discussed risks and benefits of transfusion with patient - follow up post-transfusion CBC  POD#2 - continue current care   LOS: 2 days   Howard PouchLauren Kaden Daughdrill, MD PGY-1 [facility]  05/28/2016, 7:25 AM

## 2016-05-28 NOTE — Lactation Note (Signed)
This note was copied from a baby's chart. Lactation Consultation Note  Baby 2153 hours old.  37 weeks < 5 lbs.  Mother completing blood transfusion. Mother recently pumped 3285ml+ with DEBP. Baby bf last at 1700.  When baby cues, mother will give volume to baby with slow flow nipple following guidelines. Mother states baby is also bf well.  No concerns at this time.  Mother has large nipples but states baby is able to latch. Encouraged mother to continue pumping and supplementing after.  Pump approx 4-6 times per day.   Patient Name: Cindy Felipa FurnaceKiara Cajamarca UJWJX'BToday's Date: 05/28/2016 Reason for consult: Follow-up assessment   Maternal Data    Feeding Feeding Type: Breast Fed Length of feed: 20 min  LATCH Score/Interventions Latch: Grasps breast easily, tongue down, lips flanged, rhythmical sucking.  Audible Swallowing: Spontaneous and intermittent  Type of Nipple: Everted at rest and after stimulation  Comfort (Breast/Nipple): Soft / non-tender     Hold (Positioning): No assistance needed to correctly position infant at breast.  LATCH Score: 10  Lactation Tools Discussed/Used     Consult Status Consult Status: Follow-up Date: 05/29/16 Follow-up type: In-patient    Dahlia ByesBerkelhammer, Ilhan Debenedetto [facility] 05/28/2016, 7:15 PM

## 2016-05-28 NOTE — Progress Notes (Signed)
CSW attempted again to meet with MOB, however, she states she is not feeling well and is going to be getting a blood transfusion shortly.  CSW explained that we have time to meet later and asked MOB to call CSW when she is feeling better.  CSW will check back in if MOB does not call by tomorrow.

## 2016-05-29 ENCOUNTER — Ambulatory Visit (HOSPITAL_COMMUNITY): Payer: Medicaid Other

## 2016-05-29 LAB — TYPE AND SCREEN
ABO/RH(D): B POS
ANTIBODY SCREEN: NEGATIVE
Unit division: 0
Unit division: 0

## 2016-05-29 LAB — CBC
HEMATOCRIT: 26.8 % — AB (ref 36.0–46.0)
HEMOGLOBIN: 9.1 g/dL — AB (ref 12.0–15.0)
MCH: 27.5 pg (ref 26.0–34.0)
MCHC: 34 g/dL (ref 30.0–36.0)
MCV: 81 fL (ref 78.0–100.0)
Platelets: 131 10*3/uL — ABNORMAL LOW (ref 150–400)
RBC: 3.31 MIL/uL — AB (ref 3.87–5.11)
RDW: 16.1 % — ABNORMAL HIGH (ref 11.5–15.5)
WBC: 8.1 10*3/uL (ref 4.0–10.5)

## 2016-05-29 LAB — BPAM RBC
Blood Product Expiration Date: 201805282359
Blood Product Expiration Date: 201805292359
ISSUE DATE / TIME: 201805221257
ISSUE DATE / TIME: 201805221616
UNIT TYPE AND RH: 9500
Unit Type and Rh: 9500

## 2016-05-29 MED ORDER — IBUPROFEN 600 MG PO TABS: 600.0000 mg | ORAL_TABLET | Freq: Four times a day (QID) | ORAL | 0 refills | Status: DC

## 2016-05-29 MED ORDER — OXYCODONE-ACETAMINOPHEN 5-325 MG PO TABS: 1.0000 | ORAL_TABLET | Freq: Four times a day (QID) | ORAL | 0 refills | Status: DC | PRN

## 2016-05-29 NOTE — Progress Notes (Signed)
Post Partum Day 3  Subjective:  Cindy Pearson is a 21 y.o. [id number] 7952w0d s/p RLTCS.  No acute events overnight.  Pt denies problems with ambulating, voiding or po intake.  She denies nausea or vomiting.  Pain is well controlled.  She has had flatus. She has had bowel movement.  Lochia Minimal.  Plan for birth control is Depo-Provera.  Method of Feeding: Breast and Bottle feeding.   Objective: BP 103/67   Pulse 67   Temp 98.4 F (36.9 C) (Oral)   Resp 18   Ht 5\' 1"  (1.549 m)   Wt 71.7 kg (158 lb)   LMP 09/10/2015   SpO2 100%   Breastfeeding? Unknown   BMI 29.85 kg/m   Physical Exam:  General: alert, cooperative and no distress Lochia: normal flow Chest: CTAB Heart: RRR, no m/r/g Abdomen: soft, nontender Uterine Fundus: fundus firm below umbilicus Extremities: no edema   Recent Labs  05/28/16 1926 05/29/16 0605  HGB 9.0* 9.1*  HCT 26.3* 26.8*    Assessment/Plan:  ASSESSMENT: Cindy Pearson is a 21 y.o. [id number] 2452w0d ppd #3 s/p RLTCS doing well.   Symptomatic Anemia - Hgb 9.1 today   POD#2 - Consider discharge today - Consult to social work, patient needs car seat - Depo injection with outpatient provider   LOS: 3 days   Ardyth HarpsJohn Adeolu Taunya Goral Medical Student 05/29/2016, 7:53 AM

## 2016-05-29 NOTE — Lactation Note (Signed)
This note was copied from a baby's chart. Lactation Consultation Note  Patient Name: Cindy Pearson MWNUU'VToday's Date: 05/29/2016 Reason for consult: Follow-up assessment;Infant < 6lbs  Baby 68 hours old. Mom reports that baby is nursing well, and then she is also supplementing with EBM. Mom has several ounces of EBM at bedside. Enc mom to continue to nurse with cues, and at least every 3 hours. Enc mom to continue supplementing with EBM as needed, and then post-pump followed by hand expression. Mom reports that she has an appointment with WIC tomorrow--05/30/16--and will get a DEBP at that time. Discussed WIC loaner pump, mom given paperwork and enc to call when ready to get DEBP. Enc mom to take pump parts with her at DC because she will need them in order to use the loaner pump.   Maternal Data    Feeding Feeding Type: Bottle Fed - Breast Milk Nipple Type: Slow - flow  LATCH Score/Interventions                      Lactation Tools Discussed/Used     Consult Status Consult Status: PRN    Sherlyn HayJennifer D Bach Rocchi 05/29/2016, 9:36 AM
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
## 2016-05-29 NOTE — Discharge Summary (Signed)
OB Discharge Summary     Patient Name: Cindy Pearson DOB: 05/19/1995 MRN: [id number]  Date of admission: 05/26/2016 Delivering MD: Duane LopeEURE, LUTHER H   Date of discharge: 05/29/2016  Admitting diagnosis: PREVIOUS CS Intrauterine pregnancy: [id number]     Secondary diagnosis:  Active Problems:   S/P cesarean section  Additional problems: None     Discharge diagnosis: Term Pregnancy Delivered                                                                                                Post partum procedures:n/a  Augmentation: n/a  Complications: None  Hospital course:  Sceduled C/S   21 y.o. yo [id number] at [redacted]w[redacted]d was admitted to the hospital 05/26/2016 for scheduled cesarean section with the following indication:fetal growth restriction with elevated dopplers.  Membrane Rupture Time/Date: 1:10 PM ,05/26/2016   Patient delivered a Viable infant.05/26/2016  Details of operation can be found in separate operative note.  Pateint had an uncomplicated postpartum course.  She is ambulating, tolerating a regular diet, passing flatus, and urinating well. Patient is discharged home in stable condition on  05/29/16         Physical exam  Vitals:   05/28/16 1630 05/28/16 1700 05/28/16 1853 05/29/16 0557  BP: 121/71 108/77 112/85 103/67  Pulse: 90 80 85 67  Resp: 20 20 18 18   Temp: 98.6 F (37 C) 98.5 F (36.9 C) 98.4 F (36.9 C) 98.4 F (36.9 C)  TempSrc: Oral Oral Oral Oral  SpO2: 100% 99% 99% 100%  Weight:      Height:       General: alert, cooperative and no distress Lochia: appropriate Uterine Fundus: firm Incision: Dressing is clean, dry, and intact DVT Evaluation: No evidence of DVT seen on physical exam. Negative Homan's sign. No cords or calf tenderness. No significant calf/ankle edema. Labs: Lab Results  Component Value Date   WBC 8.1 05/29/2016   HGB 9.1 (L) 05/29/2016   HCT 26.8 (L) 05/29/2016   MCV 81.0 05/29/2016   PLT 131 (L) 05/29/2016   CMP Latest Ref Rng & Units  11/09/2015  Glucose 65 - 99 mg/dL 81  BUN 6 - 20 mg/dL 10  Creatinine 6.640.44 - 4.031.00 mg/dL 4.740.75  Sodium 259135 - 563145 mmol/L 135  Potassium 3.5 - 5.1 mmol/L 4.4  Chloride 101 - 111 mmol/L 102  CO2 22 - 32 mmol/L 24  Calcium 8.9 - 10.3 mg/dL 9.6  Total Protein 6.0 - 8.3 g/dL -  Total Bilirubin 0.3 - 1.2 mg/dL -  Alkaline Phos 50 - 875162 U/L -  AST 0 - 37 U/L -  ALT 0 - 35 U/L -    Discharge instruction: per After Visit Summary and "Baby and Me Booklet".  After visit meds:  Allergies as of 05/29/2016      Reactions   Penicillins Hives   Patient states that she is allergic to all penicillins. Has patient had a PCN reaction causing immediate rash, facial/tongue/throat swelling, SOB or lightheadedness with hypotension: YES Has patient had a PCN reaction causing severe rash involving mucus membranes or skin necrosis:  NO Has patient had a PCN reaction that required hospitalization NO Has patient had a PCN reaction occurring within the last 10 years:NO If all of the above answers are "NO", then may proceed with Cephalosporin use.      Medication List    STOP taking these medications   COMFORT FIT MATERNITY SUPP LG Misc     TAKE these medications   docusate sodium 100 MG capsule Commonly known as:  COLACE Take 1 capsule (100 mg total) by mouth 2 (two) times daily.   ibuprofen 600 MG tablet Commonly known as:  ADVIL,MOTRIN Take 1 tablet (600 mg total) by mouth every 6 (six) hours.   oxyCODONE-acetaminophen 5-325 MG tablet Commonly known as:  PERCOCET/ROXICET Take 1-2 tablets by mouth every 6 (six) hours as needed.   PRENATE MINI 18-0.6-0.4-350 MG Caps Take 1 capsule by mouth daily before breakfast.       Diet: home with mother  Activity: Advance as tolerated. Pelvic rest for 6 weeks.   Outpatient follow up:6 weeks for PP visit, sooner as desired for Depo Follow up Appt:No future appointments. Follow up Visit:No Follow-up on file.  Postpartum contraception: Depo  Provera  Newborn Data: Live born female  Birth Weight: 5 lb 4.7 oz (2400 g) APGAR: 8, 9  Baby Feeding: Breast Disposition:home with mother   05/29/2016 Sharen Counter, CNM

## 2016-05-29 NOTE — Progress Notes (Signed)
CSW attempted again to meet with MOB, but she was having baby's pictures taken.  CSW requests that photographer call CSW when she is finished.

## 2016-06-02 ENCOUNTER — Inpatient Hospital Stay (HOSPITAL_COMMUNITY)
Admission: AD | Admit: 2016-06-02 | Discharge: 2016-06-03 | Disposition: A | Discharge status: Home / Self Care | Payer: Medicaid Other | Source: Ambulatory Visit | Attending: Obstetrics & Gynecology | Admitting: Obstetrics & Gynecology

## 2016-06-02 DIAGNOSIS — Z880 Allergy status to penicillin: Secondary | ICD-10-CM | POA: Diagnosis not present

## 2016-06-02 DIAGNOSIS — Z87891 Personal history of nicotine dependence: Secondary | ICD-10-CM | POA: Diagnosis not present

## 2016-06-02 DIAGNOSIS — Z9889 Other specified postprocedural states: Secondary | ICD-10-CM | POA: Insufficient documentation

## 2016-06-02 DIAGNOSIS — R102 Pelvic and perineal pain: Secondary | ICD-10-CM | POA: Diagnosis not present

## 2016-06-02 DIAGNOSIS — Z79891 Long term (current) use of opiate analgesic: Secondary | ICD-10-CM | POA: Diagnosis not present

## 2016-06-02 DIAGNOSIS — N719 Inflammatory disease of uterus, unspecified: Secondary | ICD-10-CM | POA: Insufficient documentation

## 2016-06-02 DIAGNOSIS — N939 Abnormal uterine and vaginal bleeding, unspecified: Secondary | ICD-10-CM | POA: Diagnosis present

## 2016-06-02 LAB — URINALYSIS, ROUTINE W REFLEX MICROSCOPIC
BILIRUBIN URINE: NEGATIVE
GLUCOSE, UA: NEGATIVE mg/dL
Ketones, ur: NEGATIVE mg/dL
NITRITE: NEGATIVE
PH: 6 (ref 5.0–8.0)
Protein, ur: NEGATIVE mg/dL
SPECIFIC GRAVITY, URINE: 1.016 (ref 1.005–1.030)

## 2016-06-02 LAB — CBC
HCT: 31.9 % — ABNORMAL LOW (ref 36.0–46.0)
Hemoglobin: 10.5 g/dL — ABNORMAL LOW (ref 12.0–15.0)
MCH: 27.7 pg (ref 26.0–34.0)
MCHC: 32.9 g/dL (ref 30.0–36.0)
MCV: 84.2 fL (ref 78.0–100.0)
Platelets: 218 10*3/uL (ref 150–400)
RBC: 3.79 MIL/uL — ABNORMAL LOW (ref 3.87–5.11)
RDW: 15.4 % (ref 11.5–15.5)
WBC: 9.6 10*3/uL (ref 4.0–10.5)

## 2016-06-02 MED ORDER — METRONIDAZOLE 500 MG PO TABS: 500.0000 mg | ORAL_TABLET | Freq: Two times a day (BID) | ORAL | 0 refills | Status: DC

## 2016-06-02 MED ORDER — OXYCODONE-ACETAMINOPHEN 5-325 MG PO TABS
2.0000 | ORAL_TABLET | Freq: Once | ORAL | Status: AC
  Administered 2016-06-02: 2 via ORAL
  Filled 2016-06-02: qty 2

## 2016-06-02 MED ORDER — CLINDAMYCIN HCL 300 MG PO CAPS
600.0000 mg | ORAL_CAPSULE | Freq: Once | ORAL | Status: AC
  Administered 2016-06-03: 600 mg via ORAL
  Filled 2016-06-02: qty 2

## 2016-06-02 MED ORDER — CLINDAMYCIN HCL 300 MG PO CAPS: 600.0000 mg | ORAL_CAPSULE | Freq: Four times a day (QID) | ORAL | 0 refills | Status: DC

## 2016-06-02 MED ORDER — METRONIDAZOLE 500 MG PO TABS
500.0000 mg | ORAL_TABLET | Freq: Once | ORAL | Status: AC
  Administered 2016-06-03: 500 mg via ORAL
  Filled 2016-06-02: qty 1

## 2016-06-02 MED ORDER — OXYCODONE-ACETAMINOPHEN 5-325 MG PO TABS: 1.0000 | ORAL_TABLET | ORAL | 0 refills | Status: DC | PRN

## 2016-06-02 NOTE — MAU Provider Note (Signed)
History     CSN: [id number]  Arrival date and time: 06/02/16 2047   First Provider Initiated Contact with Patient 06/02/16 2112      Chief Complaint  Patient presents with  . Vaginal Bleeding   Cindy Pearson is a 21 y.o. [id number] who is S/P c-section on 05/27/15. She states that since she has been at home she has had increasing back, pelvic and lower abdominal pain. She rates her pain 9/10 at this time. She last took pain medication around 0900 today.She states that she has been taking her mother's percocet because she did not get a prescription when she was dc home.  She also reports that her bleeding has increased. At the hospital she was using about 2 pads per day, and since being at home she is using 4/day. She reports that she has been taking care of her other children at home, and has been going up and down the stairs frequently.    Vaginal Bleeding  The patient's primary symptoms include pelvic pain. Associated symptoms include abdominal pain, back pain, chills and a fever. Pertinent negatives include no dysuria, frequency, nausea, urgency or vomiting.    Past Medical History:  Diagnosis Date  . Asthma    Albuterol INH prn  . Chlamydia 2012   hasn't picked up rx, not treated yet  . 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 Preterm labor 2011    Past Surgical History:  Procedure Laterality Date  . CESAREAN SECTION    . CESAREAN SECTION N/A 05/26/2016   Procedure: CESAREAN SECTION;  Surgeon: Cindy Pearson, Cindy H, MD;  [facility];  Service: Obstetrics;  Laterality: N/A;  . TONSILLECTOMY AND ADENOIDECTOMY      Family History  Problem Relation Age of Onset  . Diabetes Maternal Grandmother   . Cancer Maternal Grandmother   . Heart disease Maternal Grandfather   . Heart disease Paternal Grandfather   . Cancer Father     Social History  Substance Use Topics  . Smoking status: Former Games developermoker  . Smokeless tobacco: Never Used  . Alcohol use No    Allergies:   Allergies  Allergen Reactions  . Penicillins Hives    Patient states that she is allergic to all penicillins. Has patient had a PCN reaction causing immediate rash, facial/tongue/throat swelling, SOB or lightheadedness with hypotension: YES Has patient had a PCN reaction causing severe rash involving mucus membranes or skin necrosis: NO Has patient had a PCN reaction that required hospitalization NO Has patient had a PCN reaction occurring within the last 10 years:NO If all of the above answers are "NO", then may proceed with Cephalosporin use.    Prescriptions Prior to Admission  Medication Sig Dispense Refill Last Dose  . docusate sodium (COLACE) 100 MG capsule Take 1 capsule (100 mg total) by mouth 2 (two) times daily. (Patient not taking: Reported on 05/08/2016) 60 capsule 5 Not Taking at Unknown time  . ibuprofen (ADVIL,MOTRIN) 600 MG tablet Take 1 tablet (600 mg total) by mouth every 6 (six) hours. 30 tablet 0   . oxyCODONE-acetaminophen (PERCOCET/ROXICET) 5-325 MG tablet Take 1-2 tablets by mouth every 6 (six) hours as needed. 30 tablet 0   . Prenat-FeCbn-FeAsp-Meth-FA-DHA (PRENATE MINI) 18-0.6-0.4-350 MG CAPS Take 1 capsule by mouth daily before breakfast.  0 Past Month at Unknown time    Review of Systems  Constitutional: Positive for chills and fever.  Gastrointestinal: Positive for abdominal pain. Negative for nausea and vomiting.  Genitourinary: Positive for pelvic pain and vaginal bleeding. Negative for dysuria, frequency and urgency.  Musculoskeletal: Positive for back pain.   Physical Exam   Blood pressure 119/69, pulse 69, temperature (!) 100.5 F (38.1 C), temperature source Oral, resp. rate 20, last menstrual period 09/10/2015, SpO2 98 %, unknown if currently breastfeeding.  Physical Exam  Nursing note and vitals reviewed. Constitutional: She is oriented to person, place, and time. She appears well-developed and well-nourished. No distress.  HENT:  Head:  Normocephalic.  Cardiovascular: Normal rate.   Respiratory: Effort normal.  GI: Soft.  Genitourinary:  Genitourinary Comments: Fundus: tender, 3 below umbilicus  Honeycomb dressing removed. Incision C/D/I with glue still visible.   Neurological: She is alert and oriented to person, place, and time.  Skin: Skin is warm and dry.  Psychiatric: She has a normal mood and affect.   Results for orders placed or performed during the hospital encounter of 06/02/16 (from the past 24 hour(s))  CBC     Status: Abnormal   Collection Time: 06/02/16  9:28 PM  Result Value Ref Range   WBC 9.6 4.0 - 10.5 K/uL   RBC 3.79 (L) 3.87 - 5.11 MIL/uL   Hemoglobin 10.5 (L) 12.0 - 15.0 g/dL   HCT 16.1 (L) 09.6 - 04.5 %   MCV 84.2 78.0 - 100.0 fL   MCH 27.7 26.0 - 34.0 pg   MCHC 32.9 30.0 - 36.0 g/dL   RDW 40.9 81.1 - 91.4 %   Platelets 218 150 - 400 K/uL  Urinalysis, Routine w reflex microscopic     Status: Abnormal   Collection Time: 06/02/16  9:30 PM  Result Value Ref Range   Color, Urine YELLOW YELLOW   APPearance CLEAR CLEAR   Specific Gravity, Urine 1.016 1.005 - 1.030   pH 6.0 5.0 - 8.0   Glucose, UA NEGATIVE NEGATIVE mg/dL   Hgb urine dipstick LARGE (A) NEGATIVE   Bilirubin Urine NEGATIVE NEGATIVE   Ketones, ur NEGATIVE NEGATIVE mg/dL   Protein, ur NEGATIVE NEGATIVE mg/dL   Nitrite NEGATIVE NEGATIVE   Leukocytes, UA TRACE (A) NEGATIVE   RBC / HPF 6-30 0 - 5 RBC/hpf   WBC, UA 6-30 0 - 5 WBC/hpf   Bacteria, UA RARE (A) NONE SEEN   Squamous Epithelial / LPF 0-5 (A) NONE SEEN   Mucous PRESENT     MAU Course  Procedures  MDM Patient with hives from PCN. Spoke with pharmacy will do clinda and flagyl.  Assessment and Plan   1. Endometritis    DC home Comfort measures reviewed  RX: clindamycin 600mg  QID, Flagyl 500mg  BID Percocet PRN #15  Return to MAU as needed FU with OB as planned  Follow-up Information    [facility].   Specialty:  Obstetrics  and Gynecology Contact information: [address] [place] [address] [phone]           Cindy Pearson 06/02/2016, 9:20 PM

## 2016-06-02 NOTE — MAU Note (Signed)
Percocet/Roxicet (Oxycodone-acetaminophen) prescription given. Pt. Verbalized understanding.  Also discharge instructions given.

## 2016-06-02 NOTE — Discharge Instructions (Signed)
Endometritis °Endometritis is irritation, soreness, or inflammation that affects the lining of the uterus (endometrium). °Infection is usually the cause of endometritis. It is important to get treatment to prevent complications. Common complications may include more severe infections and not being able to have children(infertility). °What are the causes? °This condition may be caused by: °· Bacterial infections. °· STIs (sexually transmitted infections). °· A miscarriage or childbirth, especially after a long labor or cesarean delivery. °· Certain gynecological procedures. These may include dilation and curettage (D&C), hysteroscopy, or birth control (contraceptive) insertion. °· Tuberculosis (TB). ° °What are the signs or symptoms? °Symptoms of this condition include: °· Fever. °· Lower abdomen (abdominal) pain. °· Pelvis (pelvic) pain. °· Abnormal vaginal discharge or bleeding. °· Abdominal bloating (distention) or swelling. °· General discomfort or generally feeling ill. °· Discomfort with bowel movements. °· Constipation. ° °How is this diagnosed? °This condition may be diagnosed based on: °· A physical exam, including a pelvic exam. °· Tests, such as: °? Blood tests. °? Removal of a sample of endometrial tissue for testing (endometrial biopsy). °? Examining a sample of vaginal discharge under a microscope (wet prep). °? Removal of a sample of fluid from the cervix for testing (cervical culture). °? Surgical examination of the pelvis and abdomen. ° °How is this treated? °This condition is treated with: °· Antibiotic medicines. °· For more severe cases, hospitalization may be needed to give fluids and antibiotics directly into a vein through an IV tube. ° °Follow these instructions at home: °· Take over-the-counter and prescription medicines only as told by your health care provider. °· Drink enough fluid to keep your urine clear or pale yellow. °· Take your antibiotic medicine as told by your health care  provider. Do not stop taking the antibiotic even if you start to feel better. °· Do not douche or have sex (including vaginal, oral, and anal sex) until your health care provider approves. °· If your endometritis was caused by an STI, do not have sex (including vaginal, oral, and anal sex) until your partner has also been treated for the STI. °· Return to your normal activities as told by your health care provider. Ask your health care provider what activities are safe for you. °· Keep all follow-up visits as told by your health care provider. This is important. °Contact a health care provider if: °· You have pain that does not get better with medicine. °· You have a fever. °· You have pain with bowel movements. °Get help right away if: °· You have abdominal swelling. °· You have abdominal pain that gets worse. °· You have bad-smelling vaginal discharge, or an increased amount of vaginal discharge. °· You have abnormal vaginal bleeding. °· You have nausea and vomiting. °Summary °· Endometritis affects the lining of the uterus (endometrium) and is usually caused by an infection. °· It is important to get treatment to prevent complications. °· You have several treatment options for endometritis. Treatment may include antibiotics and IV fluids. °· Take your antibiotic medicine as told by your health care provider. Do not stop taking the antibiotic even if you start to feel better. °· Do not douche or have sex (including vaginal, oral, and anal sex) until your health care provider approves. °This information is not intended to replace advice given to you by your health care provider. Make sure you discuss any questions you have with your health care provider. °Document Released: 12/18/2000 Document Revised: 01/09/2016 Document Reviewed: 01/09/2016 °Elsevier Interactive Patient Education © 2017   [employer] ° °

## 2016-06-02 NOTE — MAU Note (Signed)
Asked patient if she is taking her pain medication at home, pt. States she never received the written prescription for Percocet, when discharged from the hospital.  She is only talking motrin, and she still needs to pick the prescription up from the pharmacy for Motrin. Crackers and ginger ale given, pt. Tolerated without emesis. Family members remain at the bedside for emotional support.

## 2016-06-02 NOTE — MAU Note (Signed)
Patient delivered via c-section on May 20th at 1311, delivered a viable infant boy.  Pt. Received 2 unit RBC on Tuesday, May 28, 2016, and was discharged to home on Wedensday, May 23rd.   Pt. Arrived via EMS tonight at 2045. Pt. Here, started clotting again around 2030, pt. States, she has been clotting on and off after delivering son. Since she has been home, she has been changing her peripad about four times a day, just passed a large clot before coming to the hospital.  Upon visual inspection, no active bleeding noted, pt. Arrived without peripad in place. Pulse ox sensor applied, sats are 99% - 100% on room air.  BP 119/69, pulse 66, oral temp 100.5.  Pt. With complain of headache, 8/10, aching, constant. Abd. Pain, middle of incision, lower back, 9/10 sharp pain.

## 2016-06-02 NOTE — MAU Note (Signed)
Abdominal incision with clear tape covering incision, labelled "sutures".  No drainage noted, no redness noted.

## 2016-06-03 NOTE — MAU Note (Signed)
E-signature device disconnected, pt. Signed copy; copy placed with medical record papers. Stable pt. Discharged with family.

## 2016-06-04 ENCOUNTER — Inpatient Hospital Stay (HOSPITAL_COMMUNITY): Payer: Medicaid Other

## 2016-06-04 ENCOUNTER — Encounter (HOSPITAL_COMMUNITY): Payer: Self-pay | Admitting: *Deleted

## 2016-06-04 ENCOUNTER — Inpatient Hospital Stay (HOSPITAL_COMMUNITY)
Admission: AD | Admit: 2016-06-04 | Discharge: 2016-06-07 | DRG: 774 | Disposition: A | Discharge status: Home / Self Care | Payer: Medicaid Other | Source: Ambulatory Visit | Attending: Family Medicine | Admitting: Family Medicine

## 2016-06-04 DIAGNOSIS — O9832 Other infections with a predominantly sexual mode of transmission complicating childbirth: Secondary | ICD-10-CM | POA: Diagnosis present

## 2016-06-04 DIAGNOSIS — N719 Inflammatory disease of uterus, unspecified: Secondary | ICD-10-CM | POA: Diagnosis present

## 2016-06-04 DIAGNOSIS — Z98891 History of uterine scar from previous surgery: Secondary | ICD-10-CM

## 2016-06-04 DIAGNOSIS — A568 Sexually transmitted chlamydial infection of other sites: Secondary | ICD-10-CM | POA: Diagnosis present

## 2016-06-04 DIAGNOSIS — R109 Unspecified abdominal pain: Secondary | ICD-10-CM

## 2016-06-04 DIAGNOSIS — O9081 Anemia of the puerperium: Secondary | ICD-10-CM | POA: Diagnosis present

## 2016-06-04 DIAGNOSIS — Z880 Allergy status to penicillin: Secondary | ICD-10-CM

## 2016-06-04 DIAGNOSIS — R509 Fever, unspecified: Secondary | ICD-10-CM | POA: Diagnosis present

## 2016-06-04 DIAGNOSIS — E876 Hypokalemia: Secondary | ICD-10-CM | POA: Diagnosis present

## 2016-06-04 DIAGNOSIS — G8918 Other acute postprocedural pain: Secondary | ICD-10-CM

## 2016-06-04 DIAGNOSIS — S3762XA Contusion of uterus, initial encounter: Secondary | ICD-10-CM | POA: Diagnosis present

## 2016-06-04 DIAGNOSIS — O9089 Other complications of the puerperium, not elsewhere classified: Secondary | ICD-10-CM | POA: Diagnosis present

## 2016-06-04 DIAGNOSIS — N3289 Other specified disorders of bladder: Secondary | ICD-10-CM | POA: Diagnosis present

## 2016-06-04 DIAGNOSIS — O8612 Endometritis following delivery: Principal | ICD-10-CM | POA: Diagnosis present

## 2016-06-04 DIAGNOSIS — R112 Nausea with vomiting, unspecified: Secondary | ICD-10-CM | POA: Diagnosis present

## 2016-06-04 DIAGNOSIS — Z87891 Personal history of nicotine dependence: Secondary | ICD-10-CM

## 2016-06-04 LAB — CBC WITH DIFFERENTIAL/PLATELET
BASOS ABS: 0 10*3/uL (ref 0.0–0.1)
Basophils Relative: 0 %
EOS PCT: 1 %
Eosinophils Absolute: 0.1 10*3/uL (ref 0.0–0.7)
HCT: 32.6 % — ABNORMAL LOW (ref 36.0–46.0)
Hemoglobin: 10.7 g/dL — ABNORMAL LOW (ref 12.0–15.0)
Lymphocytes Relative: 7 %
Lymphs Abs: 0.8 10*3/uL (ref 0.7–4.0)
MCH: 27.4 pg (ref 26.0–34.0)
MCHC: 32.8 g/dL (ref 30.0–36.0)
MCV: 83.4 fL (ref 78.0–100.0)
MONO ABS: 0.9 10*3/uL (ref 0.1–1.0)
Monocytes Relative: 8 %
Neutro Abs: 9.5 10*3/uL — ABNORMAL HIGH (ref 1.7–7.7)
Neutrophils Relative %: 84 %
PLATELETS: 255 10*3/uL (ref 150–400)
RBC: 3.91 MIL/uL (ref 3.87–5.11)
RDW: 15.2 % (ref 11.5–15.5)
WBC: 11.3 10*3/uL — ABNORMAL HIGH (ref 4.0–10.5)

## 2016-06-04 LAB — BASIC METABOLIC PANEL
Anion gap: 10 (ref 5–15)
BUN: 7 mg/dL (ref 6–20)
CHLORIDE: 102 mmol/L (ref 101–111)
CO2: 24 mmol/L (ref 22–32)
CREATININE: 0.55 mg/dL (ref 0.44–1.00)
Calcium: 8.4 mg/dL — ABNORMAL LOW (ref 8.9–10.3)
GFR calc non Af Amer: >60 mL/min (ref >60)
Glucose, Bld: 73 mg/dL (ref 65–99)
POTASSIUM: 2.9 mmol/L — AB (ref 3.5–5.1)
Sodium: 136 mmol/L (ref 135–145)

## 2016-06-04 LAB — URINALYSIS, ROUTINE W REFLEX MICROSCOPIC
BILIRUBIN URINE: NEGATIVE
Glucose, UA: NEGATIVE mg/dL
Hgb urine dipstick: NEGATIVE
Ketones, ur: 80 mg/dL — AB
NITRITE: NEGATIVE
PH: 5 (ref 5.0–8.0)
Protein, ur: 30 mg/dL — AB
SPECIFIC GRAVITY, URINE: 1.025 (ref 1.005–1.030)

## 2016-06-04 LAB — WET PREP, GENITAL
CLUE CELLS WET PREP: NONE SEEN
SPERM: NONE SEEN
TRICH WET PREP: NONE SEEN
YEAST WET PREP: NONE SEEN

## 2016-06-04 MED ORDER — MORPHINE SULFATE (PF) 4 MG/ML IV SOLN
2.0000 mg | INTRAVENOUS | Status: DC | PRN
  Administered 2016-06-04 – 2016-06-06 (×2): 2 mg via INTRAVENOUS
  Filled 2016-06-04 (×2): qty 1

## 2016-06-04 MED ORDER — IBUPROFEN 600 MG PO TABS
600.0000 mg | ORAL_TABLET | Freq: Four times a day (QID) | ORAL | Status: DC
  Administered 2016-06-04 – 2016-06-07 (×9): 600 mg via ORAL
  Filled 2016-06-04 (×10): qty 1

## 2016-06-04 MED ORDER — ONDANSETRON HCL 4 MG/2ML IJ SOLN
4.0000 mg | Freq: Once | INTRAMUSCULAR | Status: AC
  Administered 2016-06-04: 4 mg via INTRAVENOUS
  Filled 2016-06-04: qty 2

## 2016-06-04 MED ORDER — ACETAMINOPHEN 325 MG PO TABS
650.0000 mg | ORAL_TABLET | Freq: Four times a day (QID) | ORAL | Status: DC | PRN
  Administered 2016-06-04: 650 mg via ORAL
  Filled 2016-06-04: qty 2

## 2016-06-04 MED ORDER — OXYCODONE-ACETAMINOPHEN 5-325 MG PO TABS
1.0000 | ORAL_TABLET | ORAL | Status: DC | PRN
  Administered 2016-06-05: 2 via ORAL
  Filled 2016-06-04: qty 2

## 2016-06-04 MED ORDER — METRONIDAZOLE 500 MG PO TABS
500.0000 mg | ORAL_TABLET | Freq: Three times a day (TID) | ORAL | Status: DC
  Administered 2016-06-04 (×2): 500 mg via ORAL
  Filled 2016-06-04 (×4): qty 1

## 2016-06-04 MED ORDER — ACETAMINOPHEN 650 MG RE SUPP: 650.0000 mg | Freq: Four times a day (QID) | RECTAL | Status: DC | PRN

## 2016-06-04 MED ORDER — IOPAMIDOL (ISOVUE-300) INJECTION 61%
100.0000 mL | Freq: Once | INTRAVENOUS | Status: AC | PRN
  Administered 2016-06-04: 100 mL via INTRAVENOUS

## 2016-06-04 MED ORDER — IOPAMIDOL (ISOVUE-300) INJECTION 61%
30.0000 mL | Freq: Once | INTRAVENOUS | Status: AC | PRN
  Administered 2016-06-04: 30 mL via ORAL

## 2016-06-04 MED ORDER — MORPHINE SULFATE (PF) 4 MG/ML IV SOLN
2.0000 mg | INTRAVENOUS | Status: DC | PRN
  Administered 2016-06-04: 2 mg via INTRAVENOUS
  Filled 2016-06-04: qty 1

## 2016-06-04 MED ORDER — ONDANSETRON HCL 4 MG PO TABS: 4.0000 mg | ORAL_TABLET | Freq: Four times a day (QID) | ORAL | Status: DC | PRN

## 2016-06-04 MED ORDER — GENTAMICIN SULFATE 40 MG/ML IJ SOLN
Freq: Three times a day (TID) | INTRAMUSCULAR | Status: DC
  Administered 2016-06-04 – 2016-06-07 (×8): via INTRAVENOUS
  Filled 2016-06-04 (×9): qty 3.5

## 2016-06-04 MED ORDER — CLINDAMYCIN HCL 300 MG PO CAPS
600.0000 mg | ORAL_CAPSULE | Freq: Three times a day (TID) | ORAL | Status: DC
  Administered 2016-06-04: 600 mg via ORAL
  Filled 2016-06-04 (×4): qty 2

## 2016-06-04 MED ORDER — DOCUSATE SODIUM 100 MG PO CAPS
100.0000 mg | ORAL_CAPSULE | Freq: Two times a day (BID) | ORAL | Status: DC
  Administered 2016-06-04 – 2016-06-06 (×3): 100 mg via ORAL
  Filled 2016-06-04 (×6): qty 1

## 2016-06-04 MED ORDER — ONDANSETRON HCL 4 MG/2ML IJ SOLN
4.0000 mg | Freq: Four times a day (QID) | INTRAMUSCULAR | Status: DC | PRN
  Administered 2016-06-04 – 2016-06-06 (×2): 4 mg via INTRAVENOUS
  Filled 2016-06-04 (×2): qty 2

## 2016-06-04 MED ORDER — POTASSIUM CHLORIDE 10 MEQ/100ML IV SOLN
10.0000 meq | INTRAVENOUS | Status: AC
  Administered 2016-06-04 (×4): 10 meq via INTRAVENOUS
  Filled 2016-06-04 (×4): qty 100

## 2016-06-04 MED ORDER — SODIUM CHLORIDE 0.9 % IV SOLN
INTRAVENOUS | Status: DC
  Administered 2016-06-04 – 2016-06-06 (×5): via INTRAVENOUS

## 2016-06-04 NOTE — MAU Provider Note (Signed)
History     CSN: [id number]  Arrival date and time: 06/04/16 [id number]   First Provider Initiated Contact with Patient 06/04/16 [phone]      Chief Complaint  Patient presents with  . Nausea  . Emesis  . Abdominal Pain   Postop 9 days from RCS here with N/V and abdominal/pelvic pain. She reports green emesis 3 times since yesterday. She was able to tolerate ice and graham crackers. Pain started yesterday afternoon. Describes as constant, sharp, and rates 9/10. She used 2 Percocet around 0630 and had no relief. Unsure if she's had fever but has felt hot and chills. Denies urinary sx. Reports passing long clots and using 4 pads per day with dark red blood. She was seen 2 days ago with abdominal pain and dx with endometritis. She reports taking 2 doses of abx yesterday.      Past Medical History:  Diagnosis Date  . Asthma    Albuterol INH prn  . Chlamydia 2012   hasn't picked up rx, not treated yet  . 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 Preterm labor 2011    Past Surgical History:  Procedure Laterality Date  . CESAREAN SECTION    . CESAREAN SECTION N/A 05/26/2016   Procedure: CESAREAN SECTION;  Surgeon: Lazaro ArmsEure, Luther H, MD;  [facility];  Service: Obstetrics;  Laterality: N/A;  . TONSILLECTOMY AND ADENOIDECTOMY      Family History  Problem Relation Age of Onset  . Diabetes Maternal Grandmother   . Cancer Maternal Grandmother   . Heart disease Maternal Grandfather   . Heart disease Paternal Grandfather   . Cancer Father     Social History  Substance Use Topics  . Smoking status: Former Games developermoker  . Smokeless tobacco: Never Used  . Alcohol use No    Allergies:  Allergies  Allergen Reactions  . Penicillins Hives    Patient states that she is allergic to all penicillins. Has patient had a PCN reaction causing immediate rash, facial/tongue/throat swelling, SOB or lightheadedness with hypotension: YES Has patient had a PCN reaction causing severe rash  involving mucus membranes or skin necrosis: NO Has patient had a PCN reaction that required hospitalization NO Has patient had a PCN reaction occurring within the last 10 years:NO If all of the above answers are "NO", then may proceed with Cephalosporin use.    Prescriptions Prior to Admission  Medication Sig Dispense Refill Last Dose  . clindamycin (CLEOCIN) 300 MG capsule Take 2 capsules (600 mg total) by mouth 4 (four) times daily. 56 capsule 0   . docusate sodium (COLACE) 100 MG capsule Take 1 capsule (100 mg total) by mouth 2 (two) times daily. (Patient not taking: Reported on 05/08/2016) 60 capsule 5 Not Taking at Unknown time  . ibuprofen (ADVIL,MOTRIN) 600 MG tablet Take 1 tablet (600 mg total) by mouth every 6 (six) hours. 30 tablet 0   . metroNIDAZOLE (FLAGYL) 500 MG tablet Take 1 tablet (500 mg total) by mouth 2 (two) times daily. 14 tablet 0   . oxyCODONE-acetaminophen (PERCOCET/ROXICET) 5-325 MG tablet Take 1-2 tablets by mouth every 6 (six) hours as needed. 30 tablet 0   . oxyCODONE-acetaminophen (PERCOCET/ROXICET) 5-325 MG tablet Take 1-2 tablets by mouth every 4 (four) hours as needed for severe pain. 15 tablet 0   . Prenat-FeCbn-FeAsp-Meth-FA-DHA (PRENATE MINI) 18-0.6-0.4-350 MG CAPS Take 1 capsule by mouth daily before breakfast.  0 Past Month at Unknown time    Review of  Systems  Constitutional: Positive for chills. Negative for fever.  Gastrointestinal: Positive for abdominal pain.  Genitourinary: Positive for pelvic pain and vaginal bleeding. Negative for dysuria and frequency.   Physical Exam   Blood pressure 126/74, pulse 74, temperature 98.1 F (36.7 C), temperature source Oral, resp. rate 18, SpO2 97 %, unknown if currently breastfeeding.  Physical Exam  Nursing note and vitals reviewed. Constitutional: She is oriented to person, place, and time. She appears well-developed and well-nourished. She appears ill. No distress.  HENT:  Head: Normocephalic and  atraumatic.  Neck: Normal range of motion.  Cardiovascular: Normal rate.   Respiratory: Effort normal.  GI: Soft. Bowel sounds are normal. She exhibits no distension and no mass. There is generalized tenderness. There is guarding. There is no rebound.  Genitourinary:  Genitourinary Comments: External: no lesions or erythema Vagina: rugated, parous, moderate drk bloody discharge Uterus: + enlarged, anteverted, ++ tender, + CMT    Musculoskeletal: Normal range of motion.  Neurological: She is alert and oriented to person, place, and time.  Skin: Skin is warm and dry.  Low transverse abdominal incision- c/d/i with skin glue, no edema, erythema, or drainage  Psychiatric: She has a normal mood and affect.   Results for orders placed or performed during the hospital encounter of 06/04/16 (from the past 24 hour(s))  Wet prep, genital     Status: Abnormal   Collection Time: 06/04/16  8:52 AM  Result Value Ref Range   Yeast Wet Prep HPF POC NONE SEEN NONE SEEN   Trich, Wet Prep NONE SEEN NONE SEEN   Clue Cells Wet Prep HPF POC NONE SEEN NONE SEEN   WBC, Wet Prep HPF POC MANY (A) NONE SEEN   Sperm NONE SEEN   CBC with Differential/Platelet     Status: Abnormal   Collection Time: 06/04/16  9:27 AM  Result Value Ref Range   WBC 11.3 (H) 4.0 - 10.5 K/uL   RBC 3.91 3.87 - 5.11 MIL/uL   Hemoglobin 10.7 (L) 12.0 - 15.0 g/dL   HCT 16.1 (L) 09.6 - 04.5 %   MCV 83.4 78.0 - 100.0 fL   MCH 27.4 26.0 - 34.0 pg   MCHC 32.8 30.0 - 36.0 g/dL   RDW 40.9 81.1 - 91.4 %   Platelets 255 150 - 400 K/uL   Neutrophils Relative % 84 %   Neutro Abs 9.5 (H) 1.7 - 7.7 K/uL   Lymphocytes Relative 7 %   Lymphs Abs 0.8 0.7 - 4.0 K/uL   Monocytes Relative 8 %   Monocytes Absolute 0.9 0.1 - 1.0 K/uL   Eosinophils Relative 1 %   Eosinophils Absolute 0.1 0.0 - 0.7 K/uL   Basophils Relative 0 %   Basophils Absolute 0.0 0.0 - 0.1 K/uL  Urinalysis, Routine w reflex microscopic     Status: Abnormal   Collection  Time: 06/04/16 10:20 AM  Result Value Ref Range   Color, Urine AMBER (A) YELLOW   APPearance CLEAR CLEAR   Specific Gravity, Urine 1.025 1.005 - 1.030   pH 5.0 5.0 - 8.0   Glucose, UA NEGATIVE NEGATIVE mg/dL   Hgb urine dipstick NEGATIVE NEGATIVE   Bilirubin Urine NEGATIVE NEGATIVE   Ketones, ur 80 (A) NEGATIVE mg/dL   Protein, ur 30 (A) NEGATIVE mg/dL   Nitrite NEGATIVE NEGATIVE   Leukocytes, UA TRACE (A) NEGATIVE   RBC / HPF 0-5 0 - 5 RBC/hpf   WBC, UA 0-5 0 - 5 WBC/hpf   Bacteria, UA RARE (  A) NONE SEEN   Squamous Epithelial / LPF 0-5 (A) NONE SEEN   Mucous PRESENT    Ct Abdomen Pelvis W Contrast  Result Date: 06/04/2016 CLINICAL DATA:  One week postpartum from C-section delivery. Abdominal and flank pain, nausea and vomiting, and low-grade fever. EXAM: CT ABDOMEN AND PELVIS WITH CONTRAST TECHNIQUE: Multidetector CT imaging of the abdomen and pelvis was performed using the standard protocol following bolus administration of intravenous contrast. CONTRAST:  ISOVUE-300 IOPAMIDOL (ISOVUE-300) INJECTION 61% COMPARISON:  None. FINDINGS: Lower Chest: Mild dependent bibasilar atelectasis. Hepatobiliary: Tiny sub-cm low attenuation lesions are too small to characterize, but statistically most likely represent tiny cysts. No definite masses identified. Gallbladder is unremarkable. Pancreas:  No mass or inflammatory changes. Spleen: Within normal limits in size and appearance. Adrenals/Urinary Tract: No masses identified. Bilateral hydroureteronephrosis is seen due to compression of the ureters by the postpartum uterus. No evidence of contrast extravasation from the ureters or urinary bladder. Stomach/Bowel: No evidence of obstruction, inflammatory process or abnormal fluid collections. Vascular/Lymphatic: No pathologically enlarged lymph nodes. No abdominal aortic aneurysm. Reproductive: Enlarged postpartum uterus is seen. Heterogeneous high attenuation collection is seen between the anterior  lower uterine segment and urinary bladder, measuring 10.6 x 6.8 x 9.9 cm. This is consistent with a bladder flap hematoma. Other: Tiny amount of free fluid seen in the right paracolic gutter. Musculoskeletal:  No suspicious bone lesions identified. IMPRESSION: 10 cm bladder flap hematoma. Moderate bilateral hydroureteronephrosis due to ureteral compression by postpartum uterus. Electronically Signed   By: Myles Rosenthal M.D.   On: 06/04/2016 13:03   MAU Course  Procedures  MDM Labs and CT ordered and reviewed. Presentation, clinical findings, and plan discussed with Dr. Adrian Blackwater. Plan for admit.  Assessment and Plan  Post op state Bladder flap hematoma Admit to [facility] per MD  Donette Larry, CNM 06/04/2016, 8:34 AM

## 2016-06-04 NOTE — MAU Note (Signed)
PP 5/20 c-section Seen Sunday and was diagnosed with endometriosis and was told to come back with worsening pain  Patient presents with : +worsening abdominal pain--generalized, sharp, constant, rating 10/10. Last took percocet at 630am: states it did not help the pain  +Nausea +vomiting--x2 last night, x1 today

## 2016-06-04 NOTE — H&P (Signed)
Cindy Pearson is an 21 y.o. female. [id number] 9 days postop from repeat CS admitted for abdominal pain. She has 2 day hx of abdominal pain and was originally treated for endometritis. She represented today with worsening abdominal and pelvic pain combined with N/V and found to have a 10cm bladder flap hematoma.   Past Medical History:  Diagnosis Date  . Asthma    Albuterol INH prn  . Chlamydia 2012   hasn't picked up rx, not treated yet  . 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 Preterm labor 2011    Past Surgical History:  Procedure Laterality Date  . CESAREAN SECTION    . CESAREAN SECTION N/A 05/26/2016   Procedure: CESAREAN SECTION;  Surgeon: Lazaro Arms, MD;  Location: [facility] BIRTHING SUITES;  Service: Obstetrics;  Laterality: N/A;  . TONSILLECTOMY AND ADENOIDECTOMY      Family History  Problem Relation Age of Onset  . Diabetes Maternal Grandmother   . Cancer Maternal Grandmother   . Heart disease Maternal Grandfather   . Heart disease Paternal Grandfather   . Cancer Father     Social History:  reports that she has quit smoking. She has never used smokeless tobacco. She reports that she does not drink alcohol or use drugs.  Allergies:  Allergies  Allergen Reactions  . Penicillins Hives    Patient states that she is allergic to all penicillins. Has patient had a PCN reaction causing immediate rash, facial/tongue/throat swelling, SOB or lightheadedness with hypotension: YES Has patient had a PCN reaction causing severe rash involving mucus membranes or skin necrosis: NO Has patient had a PCN reaction that required hospitalization NO Has patient had a PCN reaction occurring within the last 10 years:NO If all of the above answers are "NO", then may proceed with Cephalosporin use.    Prescriptions Prior to Admission  Medication Sig Dispense Refill Last Dose  . clindamycin (CLEOCIN) 300 MG capsule Take 2 capsules (600 mg total) by mouth 4 (four) times daily. 56 capsule 0   .  docusate sodium (COLACE) 100 MG capsule Take 1 capsule (100 mg total) by mouth 2 (two) times daily. (Patient not taking: Reported on 05/08/2016) 60 capsule 5 Not Taking at Unknown time  . ibuprofen (ADVIL,MOTRIN) 600 MG tablet Take 1 tablet (600 mg total) by mouth every 6 (six) hours. 30 tablet 0   . metroNIDAZOLE (FLAGYL) 500 MG tablet Take 1 tablet (500 mg total) by mouth 2 (two) times daily. 14 tablet 0   . oxyCODONE-acetaminophen (PERCOCET/ROXICET) 5-325 MG tablet Take 1-2 tablets by mouth every 6 (six) hours as needed. 30 tablet 0   . oxyCODONE-acetaminophen (PERCOCET/ROXICET) 5-325 MG tablet Take 1-2 tablets by mouth every 4 (four) hours as needed for severe pain. 15 tablet 0   . Prenat-FeCbn-FeAsp-Meth-FA-DHA (PRENATE MINI) 18-0.6-0.4-350 MG CAPS Take 1 capsule by mouth daily before breakfast.  0 Past Month at Unknown time    Review of Systems  Constitutional: Negative for chills and fever.  Gastrointestinal: Positive for abdominal pain, nausea and vomiting.  Genitourinary: Negative for dysuria, frequency and urgency.    Blood pressure 126/74, pulse 74, temperature 98.1 F (36.7 C), temperature source Oral, resp. rate 18, SpO2 97 %, unknown if currently breastfeeding. Physical Exam  Constitutional: She is oriented to person, place, and time. She appears well-developed and well-nourished. She appears ill.  HENT:  Head: Normocephalic and atraumatic.  Neck: Normal range of motion.  Cardiovascular: Normal rate.   Respiratory: Effort normal.  GI: Soft. Bowel sounds are normal. She exhibits no distension and no mass. There is tenderness. There is guarding. There is no rebound.  Genitourinary:  Genitourinary Comments: External: no lesions or erythema Vagina: rugated, parous, moderate bloody discharge Uterus: + enlarged, anteverted, ++ tender, no CMT Adnexae: no masses, no tenderness left, no tenderness right    Musculoskeletal: Normal range of motion.  Neurological: She is alert and  oriented to person, place, and time.  Skin: Skin is warm and dry.  Psychiatric: She has a normal mood and affect.    Results for orders placed or performed during the hospital encounter of 06/04/16 (from the past 24 hour(s))  Wet prep, genital     Status: Abnormal   Collection Time: 06/04/16  8:52 AM  Result Value Ref Range   Yeast Wet Prep HPF POC NONE SEEN NONE SEEN   Trich, Wet Prep NONE SEEN NONE SEEN   Clue Cells Wet Prep HPF POC NONE SEEN NONE SEEN   WBC, Wet Prep HPF POC MANY (A) NONE SEEN   Sperm NONE SEEN   CBC with Differential/Platelet     Status: Abnormal   Collection Time: 06/04/16  9:27 AM  Result Value Ref Range   WBC 11.3 (H) 4.0 - 10.5 K/uL   RBC 3.91 3.87 - 5.11 MIL/uL   Hemoglobin 10.7 (L) 12.0 - 15.0 g/dL   HCT 16.132.6 (L) 09.636.0 - 04.546.0 %   MCV 83.4 78.0 - 100.0 fL   MCH 27.4 26.0 - 34.0 pg   MCHC 32.8 30.0 - 36.0 g/dL   RDW 40.915.2 81.111.5 - 91.415.5 %   Platelets 255 150 - 400 K/uL   Neutrophils Relative % 84 %   Neutro Abs 9.5 (H) 1.7 - 7.7 K/uL   Lymphocytes Relative 7 %   Lymphs Abs 0.8 0.7 - 4.0 K/uL   Monocytes Relative 8 %   Monocytes Absolute 0.9 0.1 - 1.0 K/uL   Eosinophils Relative 1 %   Eosinophils Absolute 0.1 0.0 - 0.7 K/uL   Basophils Relative 0 %   Basophils Absolute 0.0 0.0 - 0.1 K/uL  Urinalysis, Routine w reflex microscopic     Status: Abnormal   Collection Time: 06/04/16 10:20 AM  Result Value Ref Range   Color, Urine AMBER (A) YELLOW   APPearance CLEAR CLEAR   Specific Gravity, Urine 1.025 1.005 - 1.030   pH 5.0 5.0 - 8.0   Glucose, UA NEGATIVE NEGATIVE mg/dL   Hgb urine dipstick NEGATIVE NEGATIVE   Bilirubin Urine NEGATIVE NEGATIVE   Ketones, ur 80 (A) NEGATIVE mg/dL   Protein, ur 30 (A) NEGATIVE mg/dL   Nitrite NEGATIVE NEGATIVE   Leukocytes, UA TRACE (A) NEGATIVE   RBC / HPF 0-5 0 - 5 RBC/hpf   WBC, UA 0-5 0 - 5 WBC/hpf   Bacteria, UA RARE (A) NONE SEEN   Squamous Epithelial / LPF 0-5 (A) NONE SEEN   Mucous PRESENT     Ct Abdomen  Pelvis W Contrast  Result Date: 06/04/2016 CLINICAL DATA:  One week postpartum from C-section delivery. Abdominal and flank pain, nausea and vomiting, and low-grade fever. EXAM: CT ABDOMEN AND PELVIS WITH CONTRAST TECHNIQUE: Multidetector CT imaging of the abdomen and pelvis was performed using the standard protocol following bolus administration of intravenous contrast. CONTRAST:  100mL ISOVUE-300 IOPAMIDOL (ISOVUE-300) INJECTION 61% COMPARISON:  None. FINDINGS: Lower Chest: Mild dependent bibasilar atelectasis. Hepatobiliary: Tiny sub-cm low attenuation lesions are too small to characterize, but statistically most likely represent tiny cysts. No definite  masses identified. Gallbladder is unremarkable. Pancreas:  No mass or inflammatory changes. Spleen: Within normal limits in size and appearance. Adrenals/Urinary Tract: No masses identified. Bilateral hydroureteronephrosis is seen due to compression of the ureters by the postpartum uterus. No evidence of contrast extravasation from the ureters or urinary bladder. Stomach/Bowel: No evidence of obstruction, inflammatory process or abnormal fluid collections. Vascular/Lymphatic: No pathologically enlarged lymph nodes. No abdominal aortic aneurysm. Reproductive: Enlarged postpartum uterus is seen. Heterogeneous high attenuation collection is seen between the anterior lower uterine segment and urinary bladder, measuring 10.6 x 6.8 x 9.9 cm. This is consistent with a bladder flap hematoma. Other: Tiny amount of free fluid seen in the right paracolic gutter. Musculoskeletal:  No suspicious bone lesions identified. IMPRESSION: 10 cm bladder flap hematoma. Moderate bilateral hydroureteronephrosis due to ureteral compression by postpartum uterus. Electronically Signed   By: Myles Rosenthal M.D.   On: 06/04/2016 13:03    Assessment/Plan: Post op state Bladder flap hematoma  Admit to [facility] Continue po abx IV hydration Analgesics prn [facility]t per MD  Donette Larry,  CNM 06/04/2016, 1:44 PM

## 2016-06-04 NOTE — Progress Notes (Signed)
Pharmacy Antibiotic Note  Cindy EdingerKiara M Pearson is a 21 y.o. female admitted on 06/04/2016 with worsening abdominal and pelvic pain despite 2 days outpatient antibiotics.  Pharmacy has been consulted for Gentamicin dosing for post-op endometritis with Penicillin allergy. Pt has noted bladder flap hematoma.   Plan: 1. Gentamicin 140mg  IV q8h with Clindamycin 900mg  IV q8h. 2. Continue to follow and assess need for Gentamicin levels based on duration and pt's clinical status.  Height: 5\' 1"  (154.9 cm) Weight: 150 lb (68 kg) IBW/kg (Calculated) : 47.8  Temp (24hrs), Avg:99.4 F (37.4 C), Min:98.1 F (36.7 C), Max:100.5 F (38.1 C)   Recent Labs Lab 05/28/16 1926 05/29/16 0605 06/02/16 2128 06/04/16 0927 06/04/16 1342  WBC 8.7 8.1 9.6 11.3*  --   CREATININE  --   --   --   --  0.55    Estimated Creatinine Clearance: 98.2 mL/min (by C-G formula based on SCr of 0.55 mg/dL).    Allergies  Allergen Reactions  . Penicillins Hives    Patient states that she is allergic to all penicillins. Has patient had a PCN reaction causing immediate rash, facial/tongue/throat swelling, SOB or lightheadedness with hypotension: YES Has patient had a PCN reaction causing severe rash involving mucus membranes or skin necrosis: NO Has patient had a PCN reaction that required hospitalization NO Has patient had a PCN reaction occurring within the last 10 years:NO If all of the above answers are "NO", then may proceed with Cephalosporin use.    Antimicrobials this admission: Clindamycin and Metronidazole po  5/27 >> Clinda changed to IV  Dose adjustments this admission:   Microbiology results:   Thank you for allowing pharmacy to be a part of this patient's care.  Claybon Jabsngel, Sennie Borden G 06/04/2016 5:59 PM

## 2016-06-04 NOTE — MAU Note (Signed)
1 IV attempt Left AC, catheter would not thread

## 2016-06-05 ENCOUNTER — Ambulatory Visit (HOSPITAL_COMMUNITY): Payer: Medicaid Other

## 2016-06-05 DIAGNOSIS — O8612 Endometritis following delivery: Secondary | ICD-10-CM | POA: Diagnosis not present

## 2016-06-05 DIAGNOSIS — Z98891 History of uterine scar from previous surgery: Secondary | ICD-10-CM

## 2016-06-05 DIAGNOSIS — E876 Hypokalemia: Secondary | ICD-10-CM

## 2016-06-05 DIAGNOSIS — N719 Inflammatory disease of uterus, unspecified: Secondary | ICD-10-CM | POA: Diagnosis not present

## 2016-06-05 DIAGNOSIS — R109 Unspecified abdominal pain: Secondary | ICD-10-CM | POA: Diagnosis not present

## 2016-06-05 DIAGNOSIS — D62 Acute posthemorrhagic anemia: Secondary | ICD-10-CM

## 2016-06-05 DIAGNOSIS — N3289 Other specified disorders of bladder: Secondary | ICD-10-CM | POA: Diagnosis present

## 2016-06-05 DIAGNOSIS — O9832 Other infections with a predominantly sexual mode of transmission complicating childbirth: Secondary | ICD-10-CM | POA: Diagnosis present

## 2016-06-05 DIAGNOSIS — Z87891 Personal history of nicotine dependence: Secondary | ICD-10-CM | POA: Diagnosis not present

## 2016-06-05 DIAGNOSIS — Z880 Allergy status to penicillin: Secondary | ICD-10-CM | POA: Diagnosis not present

## 2016-06-05 DIAGNOSIS — R112 Nausea with vomiting, unspecified: Secondary | ICD-10-CM | POA: Diagnosis not present

## 2016-06-05 DIAGNOSIS — A568 Sexually transmitted chlamydial infection of other sites: Secondary | ICD-10-CM | POA: Diagnosis present

## 2016-06-05 DIAGNOSIS — O9081 Anemia of the puerperium: Secondary | ICD-10-CM | POA: Diagnosis present

## 2016-06-05 DIAGNOSIS — O9089 Other complications of the puerperium, not elsewhere classified: Secondary | ICD-10-CM | POA: Diagnosis present

## 2016-06-05 LAB — GC/CHLAMYDIA PROBE AMP (~~LOC~~) NOT AT ARMC
CHLAMYDIA, DNA PROBE: POSITIVE — AB
Neisseria Gonorrhea: NEGATIVE

## 2016-06-05 LAB — BASIC METABOLIC PANEL
Anion gap: 8 (ref 5–15)
BUN: 7 mg/dL (ref 6–20)
CALCIUM: 7.9 mg/dL — AB (ref 8.9–10.3)
CO2: 24 mmol/L (ref 22–32)
CREATININE: 0.53 mg/dL (ref 0.44–1.00)
Chloride: 106 mmol/L (ref 101–111)
GFR calc Af Amer: >60 mL/min (ref >60)
GFR calc non Af Amer: >60 mL/min (ref >60)
GLUCOSE: 97 mg/dL (ref 65–99)
Potassium: 3 mmol/L — ABNORMAL LOW (ref 3.5–5.1)
Sodium: 138 mmol/L (ref 135–145)

## 2016-06-05 LAB — CBC
HCT: 30.9 % — ABNORMAL LOW (ref 36.0–46.0)
Hemoglobin: 10.4 g/dL — ABNORMAL LOW (ref 12.0–15.0)
MCH: 27.7 pg (ref 26.0–34.0)
MCHC: 33.7 g/dL (ref 30.0–36.0)
MCV: 82.2 fL (ref 78.0–100.0)
PLATELETS: 250 10*3/uL (ref 150–400)
RBC: 3.76 MIL/uL — ABNORMAL LOW (ref 3.87–5.11)
RDW: 15.4 % (ref 11.5–15.5)
WBC: 10.9 10*3/uL — ABNORMAL HIGH (ref 4.0–10.5)

## 2016-06-05 MED ORDER — CLINDAMYCIN PHOSPHATE 900 MG/50ML IV SOLN: 900.0000 mg | Freq: Three times a day (TID) | INTRAVENOUS | Status: DC

## 2016-06-05 MED ORDER — PRENATAL MULTIVITAMIN CH
1.0000 | ORAL_TABLET | Freq: Every day | ORAL | Status: DC
  Administered 2016-06-05: 1 via ORAL
  Filled 2016-06-05 (×2): qty 1

## 2016-06-05 NOTE — Progress Notes (Signed)
Subjective: Patient reports no problems voiding.  Pain improved. No nausea and vomiting. DId have fever last night, but nothing further. Tmax 100.5 at 1600. Blood cultures and IV antibiotics started: [name] and Gent.  Objective: I have reviewed patient's vital signs, intake and output, medications, labs and microbiology.  General: alert, cooperative and no distress Resp: clear to auscultation bilaterally Cardio: regular rate and rhythm, S1, S2 normal, no murmur, click, rub or gallop GI: soft, non-tender; bowel sounds normal; no masses,  no organomegaly Extremities: extremities normal, atraumatic, no cyanosis or edema  CBC Latest Ref Rng & Units 06/05/2016 06/04/2016 06/02/2016  WBC 4.0 - 10.5 K/uL 10.9(H) 11.3(H) 9.6  Hemoglobin 12.0 - 15.0 g/dL 10.4(L) 10.7(L) 10.5(L)  Hematocrit 36.0 - 46.0 % 30.9(L) 32.6(L) 31.9(L)  Platelets 150 - 400 K/uL 250 255 218     Assessment/Plan: 1. Endometritis 2. Bladder flap hematoma 3. Hypokalemia 4. Anemia  Continue IV antibiotics, follow blood cultures  Potassium replaced  Iron   LOS: 0 days    Cindy Pearson 06/05/2016, 6:27 AM

## 2016-06-06 MED ORDER — AZITHROMYCIN 250 MG PO TABS
1000.0000 mg | ORAL_TABLET | Freq: Once | ORAL | Status: AC
  Administered 2016-06-06: 1000 mg via ORAL
  Filled 2016-06-06 (×2): qty 4

## 2016-06-06 NOTE — Progress Notes (Signed)
readmission for bladder flap hematoma , pain and low grade fever after repeat cesarean section, now POD  # 11.  Subjective: Patient reports incisional pain.  Now down to 2/10  Objective: Chlamydia culture + from 5/29. Blood cultures negative. I have reviewed patient's vital signs, intake and output, medications, labs and microbiology.  General: alert, cooperative and no distress GI: soft, non-tender; bowel sounds normal; no masses,  no organomegaly and incision: clean, dry and intact Vaginal Bleeding: minimal  Assessment: S/pcesarean with: post-op fever and hematoma, now with + Chlamydia treated 5/31  Plan: discharge Friday if afebrile and blood cultures negative, breast feeding so probably Augmentin  LOS: 1 day    Cindy Pearson 06/06/2016, 7:16 AM

## 2016-06-06 NOTE — Lactation Note (Signed)
Lactation Consultation Note  Patient Name: Cindy EdingerKiara M Redford [id number]'s Date: 06/06/2016   [facility], baby 6611 days old. Mom reports that the baby roomed in with her the first night in the hospital, but she has been pumping the rest of the time. Mom states that her milk supply is good, and she believes the baby will go back to the breast fine. Mom states that the baby has had some formula while mom has been in the hospital, but the baby has not been as receptive to the formula. Enc mom to call for assistance as needed with getting the baby back to breast. Discussed with mom that she should continue to pump if baby not nursing well. Mom reports that she has a DEBP at home.   Maternal Data    Feeding    LATCH Score/Interventions                      Lactation Tools Discussed/Used     Consult Status      Sherlyn HayJennifer D Zelma Snead 06/06/2016, 3:46 PM

## 2016-06-07 DIAGNOSIS — N719 Inflammatory disease of uterus, unspecified: Secondary | ICD-10-CM

## 2016-06-07 DIAGNOSIS — R112 Nausea with vomiting, unspecified: Secondary | ICD-10-CM

## 2016-06-07 MED ORDER — SULFAMETHOXAZOLE-TRIMETHOPRIM 800-160 MG PO TABS: 1.0000 | ORAL_TABLET | Freq: Two times a day (BID) | ORAL | 0 refills | Status: DC

## 2016-06-07 NOTE — Progress Notes (Signed)
Discharge teaching complete. Pt understood all information and did not have any questions. Pt discharged home to family. 

## 2016-06-07 NOTE — Discharge Instructions (Signed)
Endometritis °Endometritis is irritation, soreness, or inflammation that affects the lining of the uterus (endometrium). °Infection is usually the cause of endometritis. It is important to get treatment to prevent complications. Common complications may include more severe infections and not being able to have children(infertility). °What are the causes? °This condition may be caused by: °· Bacterial infections. °· STIs (sexually transmitted infections). °· A miscarriage or childbirth, especially after a long labor or cesarean delivery. °· Certain gynecological procedures. These may include dilation and curettage (D&C), hysteroscopy, or birth control (contraceptive) insertion. °· Tuberculosis (TB). ° °What are the signs or symptoms? °Symptoms of this condition include: °· Fever. °· Lower abdomen (abdominal) pain. °· Pelvis (pelvic) pain. °· Abnormal vaginal discharge or bleeding. °· Abdominal bloating (distention) or swelling. °· General discomfort or generally feeling ill. °· Discomfort with bowel movements. °· Constipation. ° °How is this diagnosed? °This condition may be diagnosed based on: °· A physical exam, including a pelvic exam. °· Tests, such as: °? Blood tests. °? Removal of a sample of endometrial tissue for testing (endometrial biopsy). °? Examining a sample of vaginal discharge under a microscope (wet prep). °? Removal of a sample of fluid from the cervix for testing (cervical culture). °? Surgical examination of the pelvis and abdomen. ° °How is this treated? °This condition is treated with: °· Antibiotic medicines. °· For more severe cases, hospitalization may be needed to give fluids and antibiotics directly into a vein through an IV tube. ° °Follow these instructions at home: °· Take over-the-counter and prescription medicines only as told by your health care provider. °· Drink enough fluid to keep your urine clear or pale yellow. °· Take your antibiotic medicine as told by your health care  provider. Do not stop taking the antibiotic even if you start to feel better. °· Do not douche or have sex (including vaginal, oral, and anal sex) until your health care provider approves. °· If your endometritis was caused by an STI, do not have sex (including vaginal, oral, and anal sex) until your partner has also been treated for the STI. °· Return to your normal activities as told by your health care provider. Ask your health care provider what activities are safe for you. °· Keep all follow-up visits as told by your health care provider. This is important. °Contact a health care provider if: °· You have pain that does not get better with medicine. °· You have a fever. °· You have pain with bowel movements. °Get help right away if: °· You have abdominal swelling. °· You have abdominal pain that gets worse. °· You have bad-smelling vaginal discharge, or an increased amount of vaginal discharge. °· You have abnormal vaginal bleeding. °· You have nausea and vomiting. °Summary °· Endometritis affects the lining of the uterus (endometrium) and is usually caused by an infection. °· It is important to get treatment to prevent complications. °· You have several treatment options for endometritis. Treatment may include antibiotics and IV fluids. °· Take your antibiotic medicine as told by your health care provider. Do not stop taking the antibiotic even if you start to feel better. °· Do not douche or have sex (including vaginal, oral, and anal sex) until your health care provider approves. °This information is not intended to replace advice given to you by your health care provider. Make sure you discuss any questions you have with your health care provider. °Document Released: 12/18/2000 Document Revised: 01/09/2016 Document Reviewed: 01/09/2016 °Elsevier Interactive Patient Education © 2017   [employer] ° °

## 2016-06-07 NOTE — Discharge Summary (Signed)
Physician Discharge Summary  Patient ID: Cindy Pearson MRN: [id number] DOB/AGE: 06-25-1995 21 y.o.  Admit date: 06/04/2016 Discharge date: 06/07/2016   Discharge Diagnoses:  Principal Problem:   Nausea and vomiting Active Problems:   S/P cesarean section   Hematoma of uterus   Fever   Endometritis   Consults: None  Significant Diagnostic Studies:  CT ABDOMEN AND PELVIS WITH CONTRAST FINDINGS: Lower Chest: Mild dependent bibasilar atelectasis.  Hepatobiliary: Tiny sub-cm low attenuation lesions are too small to characterize, but statistically most likely represent tiny cysts. No definite masses identified. Gallbladder is unremarkable.  Pancreas:  No mass or inflammatory changes.  Spleen: Within normal limits in size and appearance.  Adrenals/Urinary Tract: No masses identified. Bilateral hydroureteronephrosis is seen due to compression of the ureters by the postpartum uterus. No evidence of contrast extravasation from the ureters or urinary bladder.  Stomach/Bowel: No evidence of obstruction, inflammatory process or abnormal fluid collections.  Vascular/Lymphatic: No pathologically enlarged lymph nodes. No abdominal aortic aneurysm.  Reproductive: Enlarged postpartum uterus is seen. Heterogeneous high attenuation collection is seen between the anterior lower uterine segment and urinary bladder, measuring 10.6 x 6.8 x 9.9 cm. This is consistent with a bladder flap hematoma.  Other: Tiny amount of free fluid seen in the right paracolic gutter.  Musculoskeletal:  No suspicious bone lesions identified.  IMPRESSION: 10 cm bladder flap hematoma.  Moderate bilateral hydroureteronephrosis due to ureteral compression by postpartum uterus. CBC    Component Value Date/Time   WBC 10.9 (H) 06/05/2016 0547   RBC 3.76 (L) 06/05/2016 0547   HGB 10.4 (L) 06/05/2016 0547   HCT 30.9 (L) 06/05/2016 0547   HCT 31.1 (L) 05/02/2016 1556   PLT 250 06/05/2016 0547   PLT 151  05/02/2016 1556   MCV 82.2 06/05/2016 0547   MCV 84 05/02/2016 1556   MCH 27.7 06/05/2016 0547   MCHC 33.7 06/05/2016 0547   RDW 15.4 06/05/2016 0547   RDW 14.1 05/02/2016 1556   LYMPHSABS 0.8 06/04/2016 0927   LYMPHSABS 1.2 05/02/2016 1556   MONOABS 0.9 06/04/2016 0927   EOSABS 0.1 06/04/2016 0927   EOSABS 0.0 05/02/2016 1556   BASOSABS 0.0 06/04/2016 0927   BASOSABS 0.0 05/02/2016 1556   CMP Latest Ref Rng & Units 06/05/2016 06/04/2016 11/09/2015  Glucose 65 - 99 mg/dL 97 73 81  BUN 6 - 20 mg/dL 7 7 10   Creatinine 0.44 - 1.00 mg/dL 4.09 8.11 9.14  Sodium 135 - 145 mmol/L 138 136 135  Potassium 3.5 - 5.1 mmol/L 3.0(L) 2.9(L) 4.4  Chloride 101 - 111 mmol/L 106 102 102  CO2 22 - 32 mmol/L 24 24 24   Calcium 8.9 - 10.3 mg/dL 7.9(L) 8.4(L) 9.6  Total Protein 6.0 - 8.3 g/dL - - -  Total Bilirubin 0.3 - 1.2 mg/dL - - -  Alkaline Phos 50 - 162 U/L - - -  AST 0 - 37 U/L - - -  ALT 0 - 35 U/L - - [facility] Course: Admitted with fever, bladder flap hematoma, postpartum endometritis, placed po Abx. Remained afebrile for 48 hours. Found to have + chlamydia, treated.  Discharge Exam: Physical Examination: General appearance - alert, well appearing, and in no distress Chest - normal effort Abdomen - soft, non-tender Extremities - Homman's sign negative bilaterally  Disposition: 01-Home or Self Care  Discharged Condition: improved  Discharge Instructions    Call MD for:  persistant nausea and vomiting    Complete by:  As directed  Call MD for:  redness, tenderness, or signs of infection (pain, swelling, redness, odor or green/yellow discharge around incision site)    Complete by:  As directed    Call MD for:  severe uncontrolled pain    Complete by:  As directed    Call MD for:  temperature >100.4    Complete by:  As directed    Diet - low sodium heart healthy    Complete by:  As directed    Increase activity slowly    Complete by:  As directed    Lifting restrictions     Complete by:  As directed    Nothing > 20 lbs x 5 wks   Sexual Activity Restrictions    Complete by:  As directed    none until following postpartum visit     Allergies as of 06/07/2016      Reactions   Penicillins Hives   Patient states that she is allergic to all penicillins. Has patient had a PCN reaction causing immediate rash, facial/tongue/throat swelling, SOB or lightheadedness with hypotension: YES Has patient had a PCN reaction causing severe rash involving mucus membranes or skin necrosis: NO Has patient had a PCN reaction that required hospitalization NO Has patient had a PCN reaction occurring within the last 10 years:NO If all of the above answers are "NO", then may proceed with Cephalosporin use.      Medication List    STOP taking these medications   clindamycin 300 MG capsule Commonly known as:  CLEOCIN     TAKE these medications   ibuprofen 600 MG tablet Commonly known as:  ADVIL,MOTRIN Take 1 tablet (600 mg total) by mouth every 6 (six) hours.   metroNIDAZOLE 500 MG tablet Commonly known as:  FLAGYL Take 1 tablet (500 mg total) by mouth 2 (two) times daily.   oxyCODONE-acetaminophen 5-325 MG tablet Commonly known as:  PERCOCET/ROXICET Take 1-2 tablets by mouth every 6 (six) hours as needed.   PRENATE MINI 18-0.6-0.4-350 MG Caps Take 1 capsule by mouth daily before breakfast.   sulfamethoxazole-trimethoprim 800-160 MG tablet Commonly known as:  BACTRIM DS,SEPTRA DS Take 1 tablet by mouth 2 (two) times daily.      Follow-up Information    [facility] [place] Follow up in 2 week(s).   Specialty:  Obstetrics and Gynecology Contact information: [address], [address] [place] [id number] [phone]          Signed: Reva Boresanya S Pratt 06/07/2016, 9:29 AM

## 2016-06-09 LAB — CULTURE, BLOOD (ROUTINE X 2)
Culture: NO GROWTH
Culture: NO GROWTH
Special Requests: ADEQUATE
Special Requests: ADEQUATE

## 2016-06-12 ENCOUNTER — Ambulatory Visit (HOSPITAL_COMMUNITY): Payer: Medicaid Other

## 2016-06-13 ENCOUNTER — Encounter (HOSPITAL_COMMUNITY): Payer: Self-pay

## 2016-06-13 ENCOUNTER — Inpatient Hospital Stay (HOSPITAL_COMMUNITY)
Admission: AD | Admit: 2016-06-13 | Discharge: 2016-06-13 | Disposition: A | Discharge status: Home / Self Care | Payer: Medicaid Other | Source: Ambulatory Visit | Attending: Family Medicine | Admitting: Family Medicine

## 2016-06-13 DIAGNOSIS — Z880 Allergy status to penicillin: Secondary | ICD-10-CM | POA: Diagnosis not present

## 2016-06-13 DIAGNOSIS — Z79899 Other long term (current) drug therapy: Secondary | ICD-10-CM | POA: Insufficient documentation

## 2016-06-13 DIAGNOSIS — G8918 Other acute postprocedural pain: Secondary | ICD-10-CM | POA: Insufficient documentation

## 2016-06-13 DIAGNOSIS — Z9889 Other specified postprocedural states: Secondary | ICD-10-CM

## 2016-06-13 DIAGNOSIS — R509 Fever, unspecified: Secondary | ICD-10-CM | POA: Insufficient documentation

## 2016-06-13 DIAGNOSIS — R1031 Right lower quadrant pain: Secondary | ICD-10-CM

## 2016-06-13 DIAGNOSIS — Z87891 Personal history of nicotine dependence: Secondary | ICD-10-CM | POA: Insufficient documentation

## 2016-06-13 DIAGNOSIS — S3762XD Contusion of uterus, subsequent encounter: Secondary | ICD-10-CM

## 2016-06-13 DIAGNOSIS — R110 Nausea: Secondary | ICD-10-CM | POA: Insufficient documentation

## 2016-06-13 DIAGNOSIS — A749 Chlamydial infection, unspecified: Secondary | ICD-10-CM | POA: Insufficient documentation

## 2016-06-13 LAB — URINALYSIS, ROUTINE W REFLEX MICROSCOPIC
BACTERIA UA: NONE SEEN
Bilirubin Urine: NEGATIVE
GLUCOSE, UA: NEGATIVE mg/dL
Ketones, ur: 20 mg/dL — AB
Nitrite: NEGATIVE
PROTEIN: 30 mg/dL — AB
Specific Gravity, Urine: 1.017 (ref 1.005–1.030)
pH: 5 (ref 5.0–8.0)

## 2016-06-13 LAB — COMPREHENSIVE METABOLIC PANEL
ALT: 10 U/L — AB (ref 14–54)
AST: 14 U/L — ABNORMAL LOW (ref 15–41)
Albumin: 3.7 g/dL (ref 3.5–5.0)
Alkaline Phosphatase: 77 U/L (ref 38–126)
Anion gap: 10 (ref 5–15)
BUN: 8 mg/dL (ref 6–20)
CHLORIDE: 102 mmol/L (ref 101–111)
CO2: 24 mmol/L (ref 22–32)
Calcium: 8.9 mg/dL (ref 8.9–10.3)
Creatinine, Ser: 0.67 mg/dL (ref 0.44–1.00)
Glucose, Bld: 79 mg/dL (ref 65–99)
Potassium: 3.5 mmol/L (ref 3.5–5.1)
SODIUM: 136 mmol/L (ref 135–145)
Total Bilirubin: 1 mg/dL (ref 0.3–1.2)
Total Protein: 8.4 g/dL — ABNORMAL HIGH (ref 6.5–8.1)

## 2016-06-13 LAB — CBC WITH DIFFERENTIAL/PLATELET
BASOS ABS: 0 10*3/uL (ref 0.0–0.1)
Basophils Relative: 0 %
EOS ABS: 0.1 10*3/uL (ref 0.0–0.7)
Eosinophils Relative: 0 %
HCT: 33.7 % — ABNORMAL LOW (ref 36.0–46.0)
Hemoglobin: 10.8 g/dL — ABNORMAL LOW (ref 12.0–15.0)
LYMPHS ABS: 1.7 10*3/uL (ref 0.7–4.0)
LYMPHS PCT: 13 %
MCH: 26.5 pg (ref 26.0–34.0)
MCHC: 32 g/dL (ref 30.0–36.0)
MCV: 82.6 fL (ref 78.0–100.0)
MONO ABS: 0.5 10*3/uL (ref 0.1–1.0)
Monocytes Relative: 4 %
Neutro Abs: 10.5 10*3/uL — ABNORMAL HIGH (ref 1.7–7.7)
Neutrophils Relative %: 83 %
PLATELETS: 377 10*3/uL (ref 150–400)
RBC: 4.08 MIL/uL (ref 3.87–5.11)
RDW: 15.2 % (ref 11.5–15.5)
WBC: 12.8 10*3/uL — AB (ref 4.0–10.5)

## 2016-06-13 MED ORDER — IBUPROFEN 600 MG PO TABS
600.0000 mg | ORAL_TABLET | Freq: Once | ORAL | Status: AC
  Administered 2016-06-13: 600 mg via ORAL
  Filled 2016-06-13: qty 1

## 2016-06-13 MED ORDER — OXYCODONE-ACETAMINOPHEN 5-325 MG PO TABS: 1.0000 | ORAL_TABLET | Freq: Four times a day (QID) | ORAL | 0 refills | Status: DC | PRN

## 2016-06-13 MED ORDER — METRONIDAZOLE 500 MG PO TABS: 500.0000 mg | ORAL_TABLET | Freq: Two times a day (BID) | ORAL | 0 refills | Status: AC

## 2016-06-13 MED ORDER — SULFAMETHOXAZOLE-TRIMETHOPRIM 800-160 MG PO TABS: 1.0000 | ORAL_TABLET | Freq: Two times a day (BID) | ORAL | 0 refills | Status: DC

## 2016-06-13 MED ORDER — IBUPROFEN 600 MG PO TABS: 600.0000 mg | ORAL_TABLET | Freq: Four times a day (QID) | ORAL | 1 refills | Status: DC | PRN

## 2016-06-13 NOTE — MAU Provider Note (Signed)
Chief Complaint:  Abdominal Pain   First Provider Initiated Contact with Patient 06/13/16 1530       HPI: Cindy Pearson is a 21 y.o. [id number] who presents to maternity admissions reporting continued pain in RLQ where her hematoma is.  States started having lightheadedness and nausea yesterday.  Is on last 2 days of antibiotics for a bladder flap hematoma diagnosed on 06/04/16 . She reports small vaginal bleeding, but no vaginal itching/burning, urinary symptoms, h/a, dizziness, vomiting.  Had "100-point-something" yesterday with chills.   Abdominal Pain  This is a recurrent problem. The current episode started in the past 7 days. The onset quality is gradual. The problem occurs constantly. The problem has been unchanged. The pain is located in the RLQ. The pain is moderate. The quality of the pain is aching, cramping and sharp. The abdominal pain does not radiate. Associated symptoms include a fever and nausea. Pertinent negatives include no anorexia, constipation, diarrhea, dysuria, frequency, headaches, myalgias or vomiting. The pain is aggravated by palpation. The pain is relieved by nothing. Treatments tried: ibuprofen. The treatment provided mild relief. Her past medical history is significant for abdominal surgery (Cesarean Section).    RN Note: Patient states that yesterday she started to get light headed and having a sharp pain at her incision site and lower abdominal pain. Patient is also having nausea.     Electronically signed by Marion Downer,      Past Medical History: Past Medical History:  Diagnosis Date  . Asthma    Albuterol INH prn  . Chlamydia 2012   hasn't picked up rx, not treated yet  . 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 Preterm labor 2011    Past obstetric history: OB History  Gravida Para Term Preterm AB Living  4 3 3  0 1 3  SAB TAB Ectopic Multiple Live Births  1 0 0 0 3    # Outcome Date GA Lbr Len/2nd Weight Sex Delivery Anes PTL Lv  4 Term  05/26/16 [redacted]w[redacted]d  5 lb 4.7 oz (2.4 kg) M CS-LTranv Spinal  LIV  3 Term 03/12/11 [redacted]w[redacted]d  7 lb 1.4 oz (3.215 kg) M VBAC EPI  LIV  2 Term 07/11/09 [redacted]w[redacted]d  6 lb 14 oz (3.118 kg) F CS-LTranv EPI  LIV     Birth Comments: Failure to progress   1 SAB               Past Surgical History: Past Surgical History:  Procedure Laterality Date  . CESAREAN SECTION    . CESAREAN SECTION N/A 05/26/2016   Procedure: CESAREAN SECTION;  Surgeon: Lazaro Arms, MD;  Location: [facility] BIRTHING SUITES;  Service: Obstetrics;  Laterality: N/A;  . TONSILLECTOMY AND ADENOIDECTOMY      Family History: Family History  Problem Relation Age of Onset  . Diabetes Maternal Grandmother   . Cancer Maternal Grandmother   . Heart disease Maternal Grandfather   . Heart disease Paternal Grandfather   . Cancer Father     Social History: Social History  Substance Use Topics  . Smoking status: Former Games developer  . Smokeless tobacco: Never Used  . Alcohol use No    Allergies:  Allergies  Allergen Reactions  . Penicillins Hives    Patient states that she is allergic to all penicillins. Has patient had a PCN reaction causing immediate rash, facial/tongue/throat swelling, SOB or lightheadedness with hypotension: YES Has patient had a PCN reaction causing severe rash involving mucus  membranes or skin necrosis: NO Has patient had a PCN reaction that required hospitalization NO Has patient had a PCN reaction occurring within the last 10 years:NO If all of the above answers are "NO", then may proceed with Cephalosporin use.    Meds:  Prescriptions Prior to Admission  Medication Sig Dispense Refill Last Dose  . ibuprofen (ADVIL,MOTRIN) 600 MG tablet Take 1 tablet (600 mg total) by mouth every 6 (six) hours. (Patient not taking: Reported on 06/04/2016) 30 tablet 0 Not Taking at Unknown time  . metroNIDAZOLE (FLAGYL) 500 MG tablet Take 1 tablet (500 mg total) by mouth 2 (two) times daily. 14 tablet 0 06/03/2016 at Unknown time  .  oxyCODONE-acetaminophen (PERCOCET/ROXICET) 5-325 MG tablet Take 1-2 tablets by mouth every 6 (six) hours as needed. 30 tablet 0 06/04/2016 at Unknown time  . Prenat-FeCbn-FeAsp-Meth-FA-DHA (PRENATE MINI) 18-0.6-0.4-350 MG CAPS Take 1 capsule by mouth daily before breakfast.  0 Past Month at Unknown time  . sulfamethoxazole-trimethoprim (BACTRIM DS,SEPTRA DS) 800-160 MG tablet Take 1 tablet by mouth 2 (two) times daily. 20 tablet 0     I have reviewed patient's Past Medical Hx, Surgical Hx, Family Hx, Social Hx, medications and allergies.  ROS:  Review of Systems  Constitutional: Positive for fever.  Gastrointestinal: Positive for abdominal pain and nausea. Negative for anorexia, constipation, diarrhea and vomiting.  Genitourinary: Negative for dysuria and frequency.  Musculoskeletal: Negative for myalgias.  Neurological: Negative for headaches.   Other systems negative     Physical Exam  Patient Vitals for the past 24 hrs:  BP Temp Temp src Pulse Resp  06/13/16 1511 105/90 99 F (37.2 C) Oral 99 18   Constitutional: Well-developed, well-nourished female in no acute distress.  Cardiovascular: normal rate and rhythm, no ectopy audible, S1 & S2 heard, no murmur Respiratory: normal effort, no distress. Lungs CTAB with no wheezes or crackles GI: Abd soft, tender over incision, moreso on the cephalad/right side of her incision.  Nondistended.  No rebound, No guarding.  Bowel Sounds audible   Incision with glue intact clean and intact MS: Extremities nontender, no edema, normal ROM Neurologic: Alert and oriented x 4.   Grossly nonfocal. GU: Neg CVAT. Skin:  Warm and Dry Psych:  Affect appropriate.  PELVIC EXAM: deferred   Labs: --/--/B POS (05/22 1610) Results for orders placed or performed during the hospital encounter of 06/13/16 (from the past 24 hour(s))  Urinalysis, Routine w reflex microscopic     Status: Abnormal   Collection Time: 06/13/16  3:05 PM  Result Value Ref Range    Color, Urine YELLOW YELLOW   APPearance CLEAR CLEAR   Specific Gravity, Urine 1.017 1.005 - 1.030   pH 5.0 5.0 - 8.0   Glucose, UA NEGATIVE NEGATIVE mg/dL   Hgb urine dipstick MODERATE (A) NEGATIVE   Bilirubin Urine NEGATIVE NEGATIVE   Ketones, ur 20 (A) NEGATIVE mg/dL   Protein, ur 30 (A) NEGATIVE mg/dL   Nitrite NEGATIVE NEGATIVE   Leukocytes, UA MODERATE (A) NEGATIVE   RBC / HPF 0-5 0 - 5 RBC/hpf   WBC, UA 6-30 0 - 5 WBC/hpf   Bacteria, UA NONE SEEN NONE SEEN   Squamous Epithelial / LPF 0-5 (A) NONE SEEN   Mucous PRESENT   CBC with Differential/Platelet     Status: Abnormal   Collection Time: 06/13/16  4:20 PM  Result Value Ref Range   WBC 12.8 (H) 4.0 - 10.5 K/uL   RBC 4.08 3.87 - 5.11 MIL/uL  Hemoglobin 10.8 (L) 12.0 - 15.0 g/dL   HCT 78.433.7 (L) 69.636.0 - 29.546.0 %   MCV 82.6 78.0 - 100.0 fL   MCH 26.5 26.0 - 34.0 pg   MCHC 32.0 30.0 - 36.0 g/dL   RDW 28.415.2 13.211.5 - 44.015.5 %   Platelets 377 150 - 400 K/uL   Neutrophils Relative % 83 %   Neutro Abs 10.5 (H) 1.7 - 7.7 K/uL   Lymphocytes Relative 13 %   Lymphs Abs 1.7 0.7 - 4.0 K/uL   Monocytes Relative 4 %   Monocytes Absolute 0.5 0.1 - 1.0 K/uL   Eosinophils Relative 0 %   Eosinophils Absolute 0.1 0.0 - 0.7 K/uL   Basophils Relative 0 %   Basophils Absolute 0.0 0.0 - 0.1 K/uL  Comprehensive metabolic panel     Status: Abnormal   Collection Time: 06/13/16  4:20 PM  Result Value Ref Range   Sodium 136 135 - 145 mmol/L   Potassium 3.5 3.5 - 5.1 mmol/L   Chloride 102 101 - 111 mmol/L   CO2 24 22 - 32 mmol/L   Glucose, Bld 79 65 - 99 mg/dL   BUN 8 6 - 20 mg/dL   Creatinine, Ser 1.020.67 0.44 - 1.00 mg/dL   Calcium 8.9 8.9 - 72.510.3 mg/dL   Total Protein 8.4 (H) 6.5 - 8.1 g/dL   Albumin 3.7 3.5 - 5.0 g/dL   AST 14 (L) 15 - 41 U/L   ALT 10 (L) 14 - 54 U/L   Alkaline Phosphatase 77 38 - 126 U/L   Total Bilirubin 1.0 0.3 - 1.2 mg/dL   GFR calc non Af Amer >60 >60 mL/min   GFR calc Af Amer >60 >60 mL/min   Anion gap 10 5 - 15     Imaging:   MAU Course/MDM: I have ordered labs as follows: CBC to check WBC, which is slightly elevated.  CMET which is normal.  UA essentially negative with no bacteria on micro.  Imaging ordered:  Results reviewed.   Consult Dr Shawnie PonsPratt.  WIll review WBC with her when she is out of surgery. She recommends extending the Septra DS another 5 days and adding Flagyl for coverage.    Treatments in MAU included Ibuprofen for pain..   Pt stable at time of discharge.  Assessment: Bladder Flap Hematoma Post Cesarean section Postoperative pain Mild fever  Plan: Discharge home Recommend Supportive care. Will Rx antibiotics as above Rx sent for ibuprofen for pain Rx sent for Percocet  for severe pain Rx sent for Septra x 5 d for infection Rx sent for Flagyl for infection coverage  Encouraged to return here or to other Urgent Care/ED if she develops worsening of symptoms, increase in pain, fever, or other concerning symptoms.   Wynelle BourgeoisMarie Daija Routson CNM, MSN Certified Nurse-Midwife 06/13/2016 3:41 PM

## 2016-06-13 NOTE — Discharge Instructions (Signed)
Antibiotic Medicine, Adult Antibiotic medicines are used to treat infections caused by bacteria, such as strep throat and urinary tract infection (UTI). Antibiotic medicines will not work for viral illnesses, such as colds or the flu (influenza). They work by killing the bacteria that is making you sick. Antibiotics can also have serious side effects. It is important that you take antibiotic medicines safely and only when needed. When do I need to take antibiotics? Antibiotics are medicines that treat bacterial infections. You may need antibiotics for:  UTI.  Strep throat.  Meningitis. This infection affects the spinal cord and brain.  Bacterial sinusitis.  Serious lung infection.  You may start antibiotics while your health care provider waits for test results to come back. Common tests may include throat, urine, blood, or mucus culture. Your health care provider may change or stop the antibiotic depending on your test results. When are antibiotics not needed? You do not need antibiotics for most common illnesses. These illnesses may be caused by a virus, not a bacteria. You do not need antibiotics for:  The common cold.  Influenza.  Sore throat.  Discolored mucus.  Bronchitis.  Antibiotics are not always needed for all bacterial infections. Many of these infections clear up without antibiotic treatment. Do not ask for or take antibiotics when they are not necessary. How long should I take the antibiotic? You must take the entire prescription. Continue to take your antibiotic for as long as told by your health care provider. Do not stop taking it even if you start to feel better. If you stop taking it too soon:  You may start to feel sick again.  Your infection may become harder to treat.  Complications may develop.  Each course of antibiotics needs a different amount of time to work. Some antibiotic courses last only a few days. Some last about a week to 10 days. In some  cases, you may need to take antibiotics for a few weeks to completely treat the infection. What if I miss a dose? Try not to miss any doses of medicine. If you miss a dose, call your health care provider or pharmacist for advice. Sometimes it is okay to take the missed dose as soon as possible. What are the risks of taking antibiotics? Most antibiotics can cause an infection called Clostridium difficile (C. difficile), which causes severe diarrhea. This infection happens when the antibiotics kill the healthy bacteria in your intestines. This allows C. difficile to grow. The infection needs to be treated right away. Let your health care provider know if:  You have diarrhea while taking an antibiotic.  You have diarrhea after you stop taking an antibiotic. C. difficile infection can start weeks after stopping the antibiotic.  Taking an antibiotic also puts you at risk for getting a bacteria that does not respond to medicine (antibiotic-resistant infection) in the future. Antibiotics can cause bacteria to change so that if the antibiotic is taken again, the medicine is not able to kill the bacteria. These infections can be more serious and, in some cases, life-threatening. Do antibiotics affect birth control? Birth control pills may not work while you are on antibiotics. If you are taking birth control pills, continue taking them as usual and use a second form of birth control, such as a condom, to avoid unwanted pregnancy. Continue using the second form of birth control until your health care provider says you can stop. What else should I know about taking antibiotics? It is important for you to  take antibiotics exactly as told. Make sure that you:  Take the entire course of antibiotic that was prescribed. Do not stop taking your antibiotics even if your symptoms improve.  Take the correct amount of medicine each day.  Ask your health care provider: ? How long to wait in between doses. ? If the  antibiotic should be taken with food. ? If there are any foods, drinks, or medicines that you should avoid while taking the antibiotics. ? If there are any side effects you should be aware of.  Only use the antibiotics prescribed for you by your health care provider. Do not use antibiotics prescribed for someone else.  Drink a large glass of water along with the antibiotics.  Ask the pharmacist for a syringe, cup, or spoon that properly measures the antibiotics.  Throw away any leftover medicine.  Contact a health care provider if:  Your symptoms get worse.  You have new joint pain or muscle aches that begin after starting the antibiotic. When should I seek immediate medical care? Seek immediate medical care if:  You have signs of a serious allergic reaction to antibiotics. If you have signs of a severe allergic reaction, stop taking the antibiotic right away. Signs may include: ? Hives, which are raised, itchy, red bumps on the skin. ? Skin rash. ? Trouble breathing. ? A wheezing sound when you breathe. ? Swelling anywhere on your body. ? Feeling dizzy. ? Vomiting.  Your urine turns dark or becomes blood-colored.  Your skin turns yellow.  You bruise or bleed easily.  You have severe diarrhea and abdominal cramps.  You have a severe headache.  Summary  Antibiotic medicines are used to treat infections caused by bacteria, such as strep throat and UTIs. It is important that you take antibiotic medicines only when needed.  Your health care provider may change or stop the antibiotic depending on your test results.  Most antibiotics can cause an infection called Clostridium difficile (C. difficile), which causes severe diarrhea. Let your health care provider know if you develop diarrhea while taking an antibiotic.  Take the entire course of antibiotic that was prescribed. This information is not intended to replace advice given to you by your health care provider. Make sure  you discuss any questions you have with your health care provider. Document Released: 09/06/2003 Document Revised: 12/26/2015 Document Reviewed: 12/26/2015 Elsevier Interactive Patient Education  2018 [employer] Pain Relief Preoperatively and Postoperatively Everyone experiences pain differently, and you have the right to have your pain evaluated and managed. If you have questions, problems, or concerns about pain that you may feel before surgery (preoperatively) or after surgery (postoperatively), tell your health care provider. Severe pain after surgery--and the fear or worry associated with that pain--may cause extreme discomfort that:  Prevents sleep.  Decreases the ability to breathe deeply and to cough. This can result in pneumonia or upper airway infections.  Causes the heart to beat more quickly and the blood pressure to be higher.  Increases the risk for constipation and bloating.  Interferes with wound healing.  May result in depression, increased worry, and feelings of helplessness.  Relieving pain before surgery is also important because it lessens the pain that you will have after surgery. Patients who receive pain relief both before and after surgery experience greater pain relief than patients who receive pain relief only after surgery. If you have pain that is not controlled by medicine, tell your health care provider. Thisis very important. If you become  constipated after taking pain medicine, drink more fluids or take a laxative as told by your health care provider. Pain control methods Your health care provider follows guidelines about the management of your pain. These guidelines should be explained to you before your procedure. Work with your health care provider to plan for your postoperative pain relief. Make sure that you fully understand and agree with this plan. You should nothesitate to ask questions about the care that you are receiving. Your health care  provider may use more than one method at the same time to help relieve your pain (multimodal analgesia). Using this approach has many benefits for you, which may include being able to eat, move around, and possibly leave the hospital sooner. Opioids  Opioids are substances that relieve pain by binding to pain receptors in the brain and spinal cord (narcotic pain medicines). Opioids may help to relieve short-term (acute) postoperative pain that is moderate to moderately severe.  Opioids are often combined with non-narcotic medicines to improve pain relief, lower the risk of side effects, and reduce the chance of addiction.  To help prevent addiction, opioids are given for short periods of time in careful doses.If you follow instructions from your health care provider about taking opioids and you do not have a history of substance abuse, your risk of becoming addicted to opioids is low.  As-Needed Pain Control  You can receive pain medicine when you need it through an IV tube inserted into one of your veins, or through other forms such as a pill or liquid that you can swallow. When you tell your health care provider that you are having pain, he or she will give you the proper pain medicine.  IV Patient-Controlled Analgesia (PCA) Pump  You can receive pain medicine through an IV tube that is connected to a PCA pump. The PCA pump gives you a specific amount of medicine when you push a button. This lets you control how much medicine you receive. The pump is set up so that you cannot accidentally give yourself too much medicine. ? This button should be pushed only by you or by someone who is specifically assigned by you to do so. ? You will be able to start using your PCA pump in the recovery room after your procedure.  Tell your health care provider: ? If you are having too much pain. ? If you are feeling too sleepy or nauseous.  Continuous Epidural Pain Control  You can receive pain medicine  through a thin, flexible tube (catheter) that is inserted into your back, near your spinal cord. Medicine flows through the catheter to reduce pain in areas of your body that are below the catheter. ? This method may be recommended if you are having surgery on your abdomen, hip area, or legs.  The catheter is usually put into the back shortly before surgery. It may be left in until you can eat, take medicine by mouth, pass urine, and have a bowel movement.  This method of pain relief may help you to heal more quickly because you may be able to do these things sooner: ? Regain normal bowel and bladder function. ? Return to eating. ? Get up and walk.  Medicine That Numbs the Area (Local Anesthetic) You may be given pain medicine:  As an injection near your painful area (local infiltration).  As an injection near the nerve that provides feeling to a specific part of your body (peripheral nerve block).  As an injection  in your spine (spinal block).  Through a local anesthetic reservoir pump. This means that one or more catheters are inserted into your incision at the end of your procedure. These catheters are connected to a device that is filled with a non-narcotic pain medicine. Medicine gradually empties into your incision site over the next several days.  Other Methods of Pain Control  Steroids.  Physical therapy.  Heat and cold therapy.  Applying pressure (compression) to the painful area, such as wrapping an elastic bandage around the area.  Massage.  This information is not intended to replace advice given to you by your health care provider. Make sure you discuss any questions you have with your health care provider. Document Released: 03/16/2002 Document Revised: 05/29/2015 Document Reviewed: 09/07/2014 Elsevier Interactive Patient Education  [employer].

## 2016-06-13 NOTE — MAU Note (Signed)
Patient states that yesterday she started to get light headed and having a sharp pain at her incision site and lower abdominal pain. Patient is also having nausea.

## 2016-07-01 ENCOUNTER — Encounter: Payer: Self-pay | Admitting: Obstetrics

## 2016-07-01 ENCOUNTER — Ambulatory Visit (INDEPENDENT_AMBULATORY_CARE_PROVIDER_SITE_OTHER): Payer: Medicaid Other | Admitting: Obstetrics

## 2016-07-01 DIAGNOSIS — Z30013 Encounter for initial prescription of injectable contraceptive: Secondary | ICD-10-CM

## 2016-07-01 DIAGNOSIS — Z3042 Encounter for surveillance of injectable contraceptive: Secondary | ICD-10-CM

## 2016-07-01 DIAGNOSIS — Z3009 Encounter for other general counseling and advice on contraception: Secondary | ICD-10-CM

## 2016-07-01 DIAGNOSIS — G8918 Other acute postprocedural pain: Secondary | ICD-10-CM

## 2016-07-01 DIAGNOSIS — R109 Unspecified abdominal pain: Secondary | ICD-10-CM

## 2016-07-01 MED ORDER — MEDROXYPROGESTERONE ACETATE 150 MG/ML IM SUSP: 150.0000 mg | INTRAMUSCULAR | 4 refills | Status: DC

## 2016-07-01 MED ORDER — IBUPROFEN 600 MG PO TABS: 600.0000 mg | ORAL_TABLET | Freq: Four times a day (QID) | ORAL | 5 refills | Status: DC | PRN

## 2016-07-01 MED ORDER — MEDROXYPROGESTERONE ACETATE 150 MG/ML IM SUSP
150.0000 mg | Freq: Once | INTRAMUSCULAR | Status: AC
  Administered 2016-07-01: 150 mg via INTRAMUSCULAR

## 2016-07-01 NOTE — Addendum Note (Signed)
Addended by: Marya LandryFOSTER, Nelson Noone D on: 07/01/2016 04:48 PM   Modules accepted: Orders

## 2016-07-01 NOTE — Progress Notes (Signed)
Pt returned to office today for depo injection. Pt was made aware of depo policy. Pt supplied her depo for today's injection. Pt tolerated depo well. Pt advised to RTO on 09/22/16 for next injection.   Administrations This Visit    medroxyPROGESTERone (DEPO-PROVERA) injection 150 mg    Admin Date 07/01/2016 Action Given Dose 150 mg Route Intramuscular Administered By Lanney GinsFoster, Yasemin Rabon D, CMA

## 2016-07-01 NOTE — Progress Notes (Signed)
Subjective:     Cindy Pearson is a 21 y.o. female who presents for a postpartum visit. She is 4 weeks postpartum following a low cervical transverse Cesarean section. I have fully reviewed the prenatal and intrapartum course. The delivery was at 37 gestational weeks. Outcome: repeat cesarean section, low transverse incision. Anesthesia: spinal. Postpartum course has been normal. Baby's course has been normal. Baby is feeding by breast. Bleeding thin lochia. Bowel function is normal. Bladder function is normal. Patient is not sexually active. Contraception method is abstinence. Postpartum depression screening: negative.  Tobacco, alcohol and substance abuse history reviewed.  Adult immunizations reviewed including TDAP, rubella and varicella.  The following portions of the patient's history were reviewed and updated as appropriate: allergies, current medications, past family history, past medical history, past social history, past surgical history and problem list.  Review of Systems A comprehensive review of systems was negative.   Objective:    BP 108/76   Pulse 76   Wt 143 lb (64.9 kg)   BMI 27.02 kg/m   General:  alert and no distress   Breasts:  inspection negative, no nipple discharge or bleeding, no masses or nodularity palpable  Lungs: clear to auscultation bilaterally  Heart:  regular rate and rhythm, S1, S2 normal, no murmur, click, rub or gallop  Abdomen: normal findings: bowel sounds normal, soft, non-tender and incision is C, D, I.   Vulva:  normal  Vagina: normal vagina  Cervix:  no cervical motion tenderness  Corpus: normal size, contour, position, consistency, mobility, non-tender  Adnexa:  no mass, fullness, tenderness  Rectal Exam: Not performed.          50% of 20 min visit spent on counseling and coordination of care.    Assessment:     1. Postpartum care following cesarean delivery - Doing well.  2. Encounter for other general counseling and advice on  contraception - wants Depo Provera  3. Encounter for initial prescription of injectable contraceptive Rx: - medroxyPROGESTERone (DEPO-PROVERA) 150 MG/ML injection; Inject 1 mL (150 mg total) into the muscle every 3 (three) months.  Dispense: 1 mL; Refill: 4  4. Postoperative abdominal pain Rx: - ibuprofen (ADVIL,MOTRIN) 600 MG tablet; Take 1 tablet (600 mg total) by mouth every 6 (six) hours as needed.  Dispense: 30 tablet; Refill: 5  Plan:    1. Contraception: Depo-Provera injections 2. Depo Provera Rx 3. Follow up in: 10 months or as needed.

## 2016-07-19 NOTE — Addendum Note (Signed)
Addendum  created 07/19/16 1303 by Cynara Tatham, MD   Sign clinical note    

## 2016-07-19 NOTE — Anesthesia Postprocedure Evaluation (Signed)
Anesthesia Post Note  Patient: Cindy EdingerKiara M Odland  Procedure(s) Performed: Procedure(s) (LRB): CESAREAN SECTION (N/A)     Anesthesia Post Evaluation  Last Vitals:  Vitals:   05/28/16 1853 05/29/16 0557  BP: 112/85 103/67  Pulse: 85 67  Resp: 18 18  Temp: 36.9 C 36.9 C    Last Pain:  Vitals:   05/29/16 1215  TempSrc:   PainSc: 5                  Nazareth Norenberg EDWARD

## 2016-10-07 ENCOUNTER — Ambulatory Visit: Admitting: Pediatrics

## 2017-05-29 ENCOUNTER — Encounter (HOSPITAL_COMMUNITY): Payer: Self-pay | Admitting: Emergency Medicine

## 2017-05-29 ENCOUNTER — Other Ambulatory Visit: Payer: Self-pay

## 2017-05-29 ENCOUNTER — Emergency Department (HOSPITAL_COMMUNITY)
Admission: EM | Admit: 2017-05-29 | Discharge: 2017-05-29 | Disposition: A | Discharge status: Home / Self Care | Payer: Medicaid Other | Attending: Emergency Medicine | Admitting: Emergency Medicine

## 2017-05-29 DIAGNOSIS — A599 Trichomoniasis, unspecified: Secondary | ICD-10-CM | POA: Diagnosis not present

## 2017-05-29 DIAGNOSIS — N898 Other specified noninflammatory disorders of vagina: Secondary | ICD-10-CM | POA: Diagnosis present

## 2017-05-29 DIAGNOSIS — N390 Urinary tract infection, site not specified: Secondary | ICD-10-CM | POA: Diagnosis not present

## 2017-05-29 DIAGNOSIS — Z79899 Other long term (current) drug therapy: Secondary | ICD-10-CM | POA: Diagnosis not present

## 2017-05-29 DIAGNOSIS — Z87891 Personal history of nicotine dependence: Secondary | ICD-10-CM | POA: Insufficient documentation

## 2017-05-29 DIAGNOSIS — J45909 Unspecified asthma, uncomplicated: Secondary | ICD-10-CM | POA: Insufficient documentation

## 2017-05-29 LAB — URINALYSIS, ROUTINE W REFLEX MICROSCOPIC
BILIRUBIN URINE: NEGATIVE
Glucose, UA: NEGATIVE mg/dL
Ketones, ur: NEGATIVE mg/dL
NITRITE: NEGATIVE
PH: 6 (ref 5.0–8.0)
Protein, ur: NEGATIVE mg/dL
SPECIFIC GRAVITY, URINE: 1.021 (ref 1.005–1.030)

## 2017-05-29 LAB — POC URINE PREG, ED
PREG TEST UR: NEGATIVE
PREG TEST UR: NEGATIVE

## 2017-05-29 LAB — WET PREP, GENITAL
Clue Cells Wet Prep HPF POC: NONE SEEN
SPERM: NONE SEEN
YEAST WET PREP: NONE SEEN

## 2017-05-29 MED ORDER — METRONIDAZOLE 500 MG PO TABS: 500.0000 mg | ORAL_TABLET | Freq: Two times a day (BID) | ORAL | 0 refills | Status: DC

## 2017-05-29 MED ORDER — SULFAMETHOXAZOLE-TRIMETHOPRIM 800-160 MG PO TABS: 1.0000 | ORAL_TABLET | Freq: Two times a day (BID) | ORAL | 0 refills | Status: AC

## 2017-05-29 NOTE — ED Notes (Signed)
Pt called X 2 to have vitals reassessed. No answer. Will try to call again.

## 2017-05-29 NOTE — ED Provider Notes (Signed)
[facility] EMERGENCY DEPARTMENT Provider Note   CSN: [id number] Arrival date & time: 05/29/17  5621     History   Chief Complaint Chief Complaint  Patient presents with  . Vaginal Discharge    HPI Cindy Pearson is a 22 y.o. female.  HPI Patient presents with several days of vaginal discomfort, discharge, dysuria.  Denies fever or chills.  No abdominal pain. Past Medical History:  Diagnosis Date  . Asthma    Albuterol INH prn  . Chlamydia 2012   hasn't picked up rx, not treated yet  . 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 Preterm labor 2011    Patient Active Problem List   Diagnosis Date Noted  . Chlamydia 06/13/2016  . Postoperative pain 06/13/2016  . Nausea and vomiting 06/04/2016  . Hematoma of uterus 06/04/2016  . Fever 06/04/2016  . Endometritis 06/04/2016  . S/P cesarean section 05/26/2016  . IUGR (intrauterine growth restriction) affecting care of mother, third trimester, fetus 1 05/17/2016  . Late prenatal care affecting pregnancy, antepartum, third trimester 04/11/2016    Past Surgical History:  Procedure Laterality Date  . CESAREAN SECTION    . CESAREAN SECTION N/A 05/26/2016   Procedure: CESAREAN SECTION;  Surgeon: Lazaro Arms, MD;  Location: [facility] BIRTHING SUITES;  Service: Obstetrics;  Laterality: N/A;  . TONSILLECTOMY AND ADENOIDECTOMY       OB History    Gravida  4   Para  3   Term  3   Preterm  0   AB  1   Living  3     SAB  1   TAB  0   Ectopic  0   Multiple  0   Live Births  3            Home Medications    Prior to Admission medications   Medication Sig Start Date End Date Taking? Authorizing Provider  ibuprofen (ADVIL,MOTRIN) 600 MG tablet Take 1 tablet (600 mg total) by mouth every 6 (six) hours as needed. 07/01/16   Brock Bad, MD  medroxyPROGESTERone (DEPO-PROVERA) 150 MG/ML injection Inject 1 mL (150 mg total) into the muscle every 3 (three) months. 07/01/16   Brock Bad, MD    metroNIDAZOLE (FLAGYL) 500 MG tablet Take 1 tablet (500 mg total) by mouth 2 (two) times daily. One po bid x 7 days 05/29/17   Loren Racer, MD  oxyCODONE-acetaminophen (PERCOCET/ROXICET) 5-325 MG tablet Take 1-2 tablets by mouth every 6 (six) hours as needed. Patient not taking: Reported on 07/01/2016 06/13/16   Aviva Signs, CNM  sulfamethoxazole-trimethoprim (BACTRIM DS,SEPTRA DS) 800-160 MG tablet Take 1 tablet by mouth 2 (two) times daily for 3 days. 05/29/17 06/01/17  Loren Racer, MD    Family History Family History  Problem Relation Age of Onset  . Diabetes Maternal Grandmother   . Cancer Maternal Grandmother   . Heart disease Maternal Grandfather   . Heart disease Paternal Grandfather   . Cancer Father     Social History Social History   Tobacco Use  . Smoking status: Former Games developer  . Smokeless tobacco: Never Used  Substance Use Topics  . Alcohol use: No  . Drug use: No     Allergies   Penicillins   Review of Systems Review of Systems  Constitutional: Negative for chills and fever.  Gastrointestinal: Negative for abdominal pain, nausea and vomiting.  Genitourinary: Positive for dysuria, vaginal discharge and vaginal pain. Negative  for flank pain, frequency, hematuria, pelvic pain and vaginal bleeding.  Musculoskeletal: Negative for back pain and myalgias.  Skin: Negative for rash and wound.  Neurological: Negative for weakness, light-headedness, numbness and headaches.  All other systems reviewed and are negative.    Physical Exam Updated Vital Signs BP 101/72   Pulse 61   Temp 98.4 F (36.9 C) (Oral)   Resp 16   Ht 5' (1.524 m)   Wt 62.1 kg (137 lb)   LMP 05/06/2017 (Exact Date)   SpO2 100%   BMI 26.76 kg/m   Physical Exam  Constitutional: She is oriented to person, place, and time. She appears well-developed and well-nourished.  HENT:  Head: Normocephalic and atraumatic.  Eyes: Pupils are equal, round, and reactive to light. EOM are  normal.  Neck: Normal range of motion. Neck supple.  Cardiovascular: Normal rate and regular rhythm.  Pulmonary/Chest: Effort normal and breath sounds normal.  Abdominal: Soft. Bowel sounds are normal. She exhibits no distension. There is no tenderness. There is no rebound and no guarding.  Genitourinary: Vaginal discharge found.  Genitourinary Comments: White vaginal discharge.  No cervical motion tenderness.  No adnexal tenderness or fullness.  No obvious lesions.  Musculoskeletal: Normal range of motion. She exhibits no edema or tenderness.  No CVA tenderness  Neurological: She is alert and oriented to person, place, and time.  Skin: Skin is warm and dry. No rash noted. No erythema.  Psychiatric: She has a normal mood and affect. Her behavior is normal.  Nursing note and vitals reviewed.    ED Treatments / Results  Labs (all labs ordered are listed, but only abnormal results are displayed) Labs Reviewed  WET PREP, GENITAL - Abnormal; Notable for the following components:      Result Value   Trich, Wet Prep PRESENT (*)    WBC, Wet Prep HPF POC MANY (*)    All other components within normal limits  URINALYSIS, ROUTINE W REFLEX MICROSCOPIC - Abnormal; Notable for the following components:   APPearance HAZY (*)    Hgb urine dipstick SMALL (*)    Leukocytes, UA MODERATE (*)    Bacteria, UA RARE (*)    All other components within normal limits  POC URINE PREG, ED  POC URINE PREG, ED  GC/CHLAMYDIA PROBE AMP ([place]) NOT AT [facility]    EKG None  Radiology No results found.  Procedures Procedures (including critical care time)  Medications Ordered in ED Medications - No data to display   Initial Impression / Assessment and Plan / ED Course  I have reviewed the triage vital signs and the nursing notes.  Pertinent labs & imaging results that were available during my care of the patient were reviewed by me and considered in my medical decision making (see chart for  details).     Evidence of likely UTI and trichomonas on wet prep.  Given prescription for Bactrim and Flagyl.  Need to be informed if gonorrhea or chlamydia cultures are positive.  Advised to follow-up in health department and have all sexual partners evaluated and treated.  Final Clinical Impressions(s) / ED Diagnoses   Final diagnoses:  Acute lower UTI  Trichomoniasis    ED Discharge Orders        Ordered    metroNIDAZOLE (FLAGYL) 500 MG tablet  2 times daily     05/29/17 1428    sulfamethoxazole-trimethoprim (BACTRIM DS,SEPTRA DS) 800-160 MG tablet  2 times daily     05/29/17 1428  Raahi Korber,Loren Racer/[birth year] [phone]

## 2017-05-29 NOTE — ED Notes (Signed)
Pt wearing earbuds. Pt called again for vitals.

## 2017-05-29 NOTE — ED Notes (Signed)
Pt sitting outside on bench.  

## 2017-05-29 NOTE — ED Notes (Signed)
ED Provider at bedside. 

## 2017-05-29 NOTE — ED Notes (Signed)
Pt stable, ambulatory, states understanding of discharge instructions 

## 2017-05-29 NOTE — ED Triage Notes (Signed)
Pt to ED c/o vaginal d/c and discomfort for a few days - reports recent break up with significant other and would like to be tested for STI. Denies other symptoms. LMP 05/06/17.

## 2017-05-29 NOTE — ED Notes (Signed)
Found urine in mini lab for POC urine preg to be run, no one in mini lab.  **Left with a note

## 2017-05-29 NOTE — ED Notes (Signed)
Pt given gram crackers and Sprite.

## 2017-05-30 LAB — GC/CHLAMYDIA PROBE AMP (~~LOC~~) NOT AT ARMC
Chlamydia: POSITIVE — AB
Neisseria Gonorrhea: NEGATIVE

## 2017-06-27 ENCOUNTER — Emergency Department (HOSPITAL_COMMUNITY)
Admission: EM | Admit: 2017-06-27 | Discharge: 2017-06-27 | Disposition: A | Discharge status: Other Acute Inpatient Hospital | Payer: Self-pay

## 2017-06-27 ENCOUNTER — Encounter (HOSPITAL_COMMUNITY): Payer: Self-pay | Admitting: *Deleted

## 2017-06-27 ENCOUNTER — Ambulatory Visit (HOSPITAL_COMMUNITY)
Admission: EM | Admit: 2017-06-27 | Discharge: 2017-06-27 | Disposition: A | Discharge status: Home / Self Care | Payer: Medicaid Other | Attending: Family Medicine | Admitting: Family Medicine

## 2017-06-27 ENCOUNTER — Other Ambulatory Visit: Payer: Self-pay

## 2017-06-27 DIAGNOSIS — R110 Nausea: Secondary | ICD-10-CM

## 2017-06-27 DIAGNOSIS — R112 Nausea with vomiting, unspecified: Secondary | ICD-10-CM

## 2017-06-27 DIAGNOSIS — N898 Other specified noninflammatory disorders of vagina: Secondary | ICD-10-CM

## 2017-06-27 DIAGNOSIS — Z3201 Encounter for pregnancy test, result positive: Secondary | ICD-10-CM | POA: Diagnosis not present

## 2017-06-27 DIAGNOSIS — A749 Chlamydial infection, unspecified: Secondary | ICD-10-CM

## 2017-06-27 DIAGNOSIS — A599 Trichomoniasis, unspecified: Secondary | ICD-10-CM

## 2017-06-27 DIAGNOSIS — Z3490 Encounter for supervision of normal pregnancy, unspecified, unspecified trimester: Secondary | ICD-10-CM

## 2017-06-27 LAB — POCT PREGNANCY, URINE: Preg Test, Ur: POSITIVE — AB

## 2017-06-27 MED ORDER — METRONIDAZOLE 500 MG PO TABS
ORAL_TABLET | ORAL | Status: AC
  Filled 2017-06-27: qty 4

## 2017-06-27 MED ORDER — CEFTRIAXONE SODIUM 250 MG IJ SOLR
INTRAMUSCULAR | Status: AC
  Filled 2017-06-27: qty 250

## 2017-06-27 MED ORDER — CEFTRIAXONE SODIUM 250 MG IJ SOLR
250.0000 mg | Freq: Once | INTRAMUSCULAR | Status: AC
  Administered 2017-06-27: 250 mg via INTRAMUSCULAR

## 2017-06-27 MED ORDER — METRONIDAZOLE 500 MG PO TABS
2000.0000 mg | ORAL_TABLET | Freq: Once | ORAL | Status: AC
  Administered 2017-06-27: 2000 mg via ORAL

## 2017-06-27 MED ORDER — AZITHROMYCIN 250 MG PO TABS
1000.0000 mg | ORAL_TABLET | Freq: Once | ORAL | Status: AC
  Administered 2017-06-27: 1000 mg via ORAL

## 2017-06-27 MED ORDER — LIDOCAINE HCL (PF) 1 % IJ SOLN
INTRAMUSCULAR | Status: AC
  Filled 2017-06-27: qty 2

## 2017-06-27 MED ORDER — AZITHROMYCIN 250 MG PO TABS
ORAL_TABLET | ORAL | Status: AC
  Filled 2017-06-27: qty 4

## 2017-06-27 NOTE — ED Triage Notes (Signed)
Per [facility], pt complains of n/v x 3 days. Unknown LMP. Pt states "I wouldn't be surprised if I'm pregnant"

## 2017-06-27 NOTE — Discharge Instructions (Signed)
See OBGYN asap No sexual relations until 7 days after treatment Sexual contact MUST be treated also

## 2017-06-27 NOTE — ED Provider Notes (Signed)
[facility]    CSN: [id number] Arrival date & time: 06/27/17  1445     History   Chief Complaint Chief Complaint  Patient presents with  . Nausea    HPI Cindy Pearson is a 22 y.o. female.   HPI  Patient is here for nausea and vomiting.  She states she has had nausea and vomiting intermittently for 3 days.  She is having trouble keeping food down.  She is able to keep down fluids.  She states that she broke up with her boyfriend because he gave her chlamydia and trichomonas.  She was seen in the emergency room on 05/29/2017.  She did not get the medicines prescribed.  She knows she was positive for 2 STDs.  These have not been treated.  She is still having some vaginal discomfort.  No abdominal pain.  No fever or chills.  No vaginal bleeding.  She states that she forgive her boyfriend and had "make-up sex" that was unprotected.  She thinks this is when she likely became pregnant.  She is here for pregnancy test, and for treatment of her STDs.   Past Medical History:  Diagnosis Date  . Asthma    Albuterol INH prn  . Chlamydia 2012   hasn't picked up rx, not treated yet  . 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 Preterm labor 2011    Patient Active Problem List   Diagnosis Date Noted  . Chlamydia 06/13/2016  . Postoperative pain 06/13/2016  . Nausea and vomiting 06/04/2016  . Hematoma of uterus 06/04/2016  . Fever 06/04/2016  . Endometritis 06/04/2016  . S/P cesarean section 05/26/2016  . IUGR (intrauterine growth restriction) affecting care of mother, third trimester, fetus 1 05/17/2016  . Late prenatal care affecting pregnancy, antepartum, third trimester 04/11/2016    Past Surgical History:  Procedure Laterality Date  . CESAREAN SECTION    . CESAREAN SECTION N/A 05/26/2016   Procedure: CESAREAN SECTION;  Surgeon: Lazaro Arms, MD;  Location: [facility] BIRTHING SUITES;  Service: Obstetrics;  Laterality: N/A;  . TONSILLECTOMY AND ADENOIDECTOMY      OB History     Gravida  4   Para  3   Term  3   Preterm  0   AB  1   Living  3     SAB  1   TAB  0   Ectopic  0   Multiple  0   Live Births  3            Home Medications    Prior to Admission medications   Not on File    Family History Family History  Problem Relation Age of Onset  . Diabetes Maternal Grandmother   . Cancer Maternal Grandmother   . Heart disease Maternal Grandfather   . Heart disease Paternal Grandfather   . Cancer Father     Social History Social History   Tobacco Use  . Smoking status: Current Every Day Smoker  . Smokeless tobacco: Never Used  Substance Use Topics  . Alcohol use: No  . Drug use: No     Allergies   Penicillins   Review of Systems Review of Systems  Constitutional: Negative for chills and fever.  HENT: Negative for ear pain and sore throat.   Eyes: Negative for pain and visual disturbance.  Respiratory: Negative for cough and shortness of breath.   Cardiovascular: Negative for chest pain and palpitations.  Gastrointestinal: Positive for  nausea. Negative for abdominal pain and vomiting.  Genitourinary: Positive for vaginal discharge. Negative for dysuria, hematuria and vaginal bleeding.  Musculoskeletal: Negative for arthralgias and back pain.  Skin: Negative for color change and rash.  Neurological: Negative for seizures and syncope.  All other systems reviewed and are negative.    Physical Exam Triage Vital Signs ED Triage Vitals [06/27/17 1459]  Enc Vitals Group     BP 111/71     Pulse Rate 67     Resp 16     Temp 98.4 F (36.9 C)     Temp Source Oral     SpO2 100 %     Weight      Height      Head Circumference      Peak Flow      Pain Score 0     Pain Loc      Pain Edu?      Excl. in GC?    No data found.  Updated Vital Signs BP 111/71   Pulse 67   Temp 98.4 F (36.9 C) (Oral)   Resp 16   LMP 05/03/2017 (Approximate)   SpO2 100%   Breastfeeding? No   Visual Acuity Right Eye  Distance:   Left Eye Distance:   Bilateral Distance:    Right Eye Near:   Left Eye Near:    Bilateral Near:     Physical Exam  Constitutional: She appears well-developed and well-nourished. No distress.  HENT:  Head: Normocephalic and atraumatic.  Mouth/Throat: Oropharynx is clear and moist.  Eyes: Pupils are equal, round, and reactive to light. Conjunctivae are normal.  Neck: Normal range of motion.  Cardiovascular: Normal rate.  Pulmonary/Chest: Effort normal. No respiratory distress.  Abdominal: Soft. She exhibits no distension.  Musculoskeletal: Normal range of motion. She exhibits no edema.  Neurological: She is alert.  Skin: Skin is warm and dry.  Psychiatric: She has a normal mood and affect.  Poor judgment     UC Treatments / Results  Labs (all labs ordered are listed, but only abnormal results are displayed) Labs Reviewed - No data to display  EKG None  Radiology No results found.  Procedures Procedures (including critical care time)  Medications Ordered in UC Medications  cefTRIAXone (ROCEPHIN) injection 250 mg (250 mg Intramuscular Given 06/27/17 1600)  azithromycin (ZITHROMAX) tablet 1,000 mg (1,000 mg Oral Given 06/27/17 1556)  metroNIDAZOLE (FLAGYL) tablet 2,000 mg (2,000 mg Oral Given 06/27/17 1558)    Initial Impression / Assessment and Plan / UC Course  I have reviewed the triage vital signs and the nursing notes.  Pertinent labs & imaging results that were available during my care of the patient were reviewed by me and considered in my medical decision making (see chart for details).     Gust the patient that she has early pregnancy.  She needs treatment for the chlamydia and for the trichomonas.  She is seeing OB/GYN right away.  She needs not to drink any alcohol or take any medication until she sees an OB/GYN.  She needs to be taking a prenatal vitamin. Final Clinical Impressions(s) / UC Diagnoses   Final diagnoses:  Early stage of pregnancy   Chlamydia infection  Trichomonas infection  Nausea     Discharge Instructions     See OBGYN asap No sexual relations until 7 days after treatment Sexual contact MUST be treated also   ED Prescriptions    None     Controlled Substance Prescriptions [place]  Controlled Substance Registry consulted? No   Eustace Moore, MD 06/27/17 [phone]

## 2017-06-27 NOTE — ED Notes (Signed)
Pt came up to nurse first and asked how long the wait is. Pt then left ED and went up to UC.  Pt has not been seen by provider or RN. UC called [facility] to take Pt out of computer.

## 2017-06-27 NOTE — ED Triage Notes (Signed)
C/O nausea and vomiting x 3 days without any pain.  States this afternoon able to keep down some PO fluids, but continues with significant nausea.  Unk if pregnant.

## 2017-07-01 ENCOUNTER — Encounter (HOSPITAL_COMMUNITY): Payer: Self-pay | Admitting: *Deleted

## 2017-07-01 ENCOUNTER — Other Ambulatory Visit: Payer: Self-pay

## 2017-07-01 ENCOUNTER — Inpatient Hospital Stay (HOSPITAL_COMMUNITY)
Admission: AD | Admit: 2017-07-01 | Discharge: 2017-07-01 | Disposition: A | Discharge status: Home / Self Care | Payer: Medicaid Other | Source: Ambulatory Visit | Attending: Obstetrics and Gynecology | Admitting: Obstetrics and Gynecology

## 2017-07-01 DIAGNOSIS — O210 Mild hyperemesis gravidarum: Secondary | ICD-10-CM | POA: Diagnosis present

## 2017-07-01 DIAGNOSIS — O219 Vomiting of pregnancy, unspecified: Secondary | ICD-10-CM

## 2017-07-01 DIAGNOSIS — Z3A08 8 weeks gestation of pregnancy: Secondary | ICD-10-CM | POA: Diagnosis not present

## 2017-07-01 HISTORY — DX: Encounter for other specified aftercare: Z51.89

## 2017-07-01 LAB — URINALYSIS, ROUTINE W REFLEX MICROSCOPIC
BILIRUBIN URINE: NEGATIVE
Glucose, UA: NEGATIVE mg/dL
Hgb urine dipstick: NEGATIVE
Ketones, ur: 80 mg/dL — AB
Nitrite: NEGATIVE
PH: 5 (ref 5.0–8.0)
Protein, ur: 100 mg/dL — AB
Specific Gravity, Urine: 1.03 (ref 1.005–1.030)

## 2017-07-01 MED ORDER — ONDANSETRON 8 MG PO TBDP: 8.0000 mg | ORAL_TABLET | Freq: Three times a day (TID) | ORAL | 3 refills | Status: DC | PRN

## 2017-07-01 MED ORDER — PROMETHAZINE HCL 25 MG PO TABS: 12.5000 mg | ORAL_TABLET | Freq: Four times a day (QID) | ORAL | 3 refills | Status: DC | PRN

## 2017-07-01 MED ORDER — LACTATED RINGERS IV BOLUS
1000.0000 mL | Freq: Once | INTRAVENOUS | Status: AC
  Administered 2017-07-01: 1000 mL via INTRAVENOUS

## 2017-07-01 MED ORDER — PROMETHAZINE HCL 25 MG/ML IJ SOLN
25.0000 mg | Freq: Once | INTRAVENOUS | Status: AC
  Administered 2017-07-01: 25 mg via INTRAVENOUS
  Filled 2017-07-01: qty 1

## 2017-07-01 NOTE — MAU Provider Note (Signed)
History     CSN: [id number]  Arrival date and time: 07/01/17 4098   First Provider Initiated Contact with Patient 07/01/17 0919      Chief Complaint  Patient presents with  . Emesis   Cindy Pearson is a 22 y.o. [phone] at [redacted]w[redacted]d who presents today via EMS with nausea and vomiting x 2 weeks.   Emesis   This is a new problem. Episode onset: 2 weeks  The problem occurs 5 to 10 times per day. The problem has been unchanged. The emesis has an appearance of stomach contents. There has been no fever. Pertinent negatives include no chills, diarrhea or fever. Risk factors: pregnancy. hx of hyperemesis with prior pregnancies  Treatments tried: zofran. The treatment provided no relief.    Past Medical History:  Diagnosis Date  . Asthma    Albuterol INH prn  . Blood transfusion without reported diagnosis   . Chlamydia 2012   hasn't picked up rx, not treated yet  . 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 Preterm labor 2011    Past Surgical History:  Procedure Laterality Date  . CESAREAN SECTION    . CESAREAN SECTION N/A 05/26/2016   Procedure: CESAREAN SECTION;  Surgeon: Lazaro Arms, MD;  Location: [facility] BIRTHING SUITES;  Service: Obstetrics;  Laterality: N/A;  . TONSILLECTOMY AND ADENOIDECTOMY      Family History  Problem Relation Age of Onset  . Diabetes Maternal Grandmother   . Cancer Maternal Grandmother   . Heart disease Maternal Grandfather   . Heart disease Paternal Grandfather   . Cancer Father     Social History   Tobacco Use  . Smoking status: Current Every Day Smoker  . Smokeless tobacco: Never Used  Substance Use Topics  . Alcohol use: No  . Drug use: No    Allergies:  Allergies  Allergen Reactions  . Penicillins Hives    Patient states that she is allergic to all penicillins. Has patient had a PCN reaction causing immediate rash, facial/tongue/throat swelling, SOB or lightheadedness with hypotension: YES Has patient had a PCN reaction causing severe rash  involving mucus membranes or skin necrosis: NO Has patient had a PCN reaction that required hospitalization NO Has patient had a PCN reaction occurring within the last 10 years:NO If all of the above answers are "NO", then may proceed with Cephalosporin use.    No medications prior to admission.    Review of Systems  Constitutional: Negative for chills and fever.  Gastrointestinal: Positive for nausea and vomiting. Negative for constipation and diarrhea.  Genitourinary: Negative for dysuria, pelvic pain, vaginal bleeding and vaginal discharge.   Physical Exam   Blood pressure 112/68, pulse 64, temperature 98.6 F (37 C), temperature source Oral, resp. rate 16, height 5' (1.524 m), weight 130 lb (59 kg), last menstrual period 05/03/2017, SpO2 99 %, not currently breastfeeding.  Physical Exam  Nursing note and vitals reviewed. Constitutional: She is oriented to person, place, and time. She appears well-developed and well-nourished. No distress.  HENT:  Head: Normocephalic.  Cardiovascular: Normal rate.  Respiratory: Effort normal.  GI: Soft. There is no tenderness. There is no rebound and no guarding.  Neurological: She is alert and oriented to person, place, and time.  Skin: Skin is warm and dry.  Psychiatric: She has a normal mood and affect.    Results for orders placed or performed during the hospital encounter of 07/01/17 (from the past 24 hour(s))  Urinalysis, Routine w  reflex microscopic     Status: Abnormal   Collection Time: 07/01/17  8:58 AM  Result Value Ref Range   Color, Urine YELLOW YELLOW   APPearance HAZY (A) CLEAR   Specific Gravity, Urine 1.030 1.005 - 1.030   pH 5.0 5.0 - 8.0   Glucose, UA NEGATIVE NEGATIVE mg/dL   Hgb urine dipstick NEGATIVE NEGATIVE   Bilirubin Urine NEGATIVE NEGATIVE   Ketones, ur 80 (A) NEGATIVE mg/dL   Protein, ur 811100 (A) NEGATIVE mg/dL   Nitrite NEGATIVE NEGATIVE   Leukocytes, UA SMALL (A) NEGATIVE   RBC / HPF 0-5 0 - 5 RBC/hpf    WBC, UA 11-20 0 - 5 WBC/hpf   Bacteria, UA RARE (A) NONE SEEN   Squamous Epithelial / LPF 11-20 0 - 5   Mucus PRESENT      MAU Course  Procedures  MDM Patient has had phenergan and IV fluids. She has been tolerating solid foods while here in MAU.   Assessment and Plan   1. Nausea/vomiting in pregnancy   2. [redacted] weeks gestation of pregnancy    DC home Comfort measures reviewed  1st Trimester precautions  Bleeding precautions RX: zofran 8mg  PRN #20 with 3RF, Phenergan 25mg  PRN #30 with 3 RF  Return to MAU as needed Start prenatal care   Follow-up Information    [facility].   Contact information: [address] [place] [id number] [phone]            Thressa ShellerHeather Kyria Bumgardner 07/01/2017, 11:24 AM

## 2017-07-01 NOTE — MAU Note (Signed)
(  EMS arrival, vss.)  2 wks ago, started throwing up really bad.  This past Friday, got so bad, went to UC, found out she was preg. Was not given rx, had some leftover Zofran, not working.  Can't keep anything down, is just so dehydrated.

## 2017-07-01 NOTE — MAU Note (Signed)
Pt has had N&V for the last 2 weeks, found out she was pregnant [redacted] week ago vomiting has been increasingly worse.  H/O hyper emesis with previous pregnancy.  Has been taking "old zofran" from last pregnancy.

## 2017-07-01 NOTE — Discharge Instructions (Signed)

## 2017-07-22 ENCOUNTER — Encounter: Payer: Self-pay | Admitting: Obstetrics and Gynecology

## 2017-07-28 ENCOUNTER — Encounter: Payer: Self-pay | Admitting: Obstetrics and Gynecology

## 2017-09-03 ENCOUNTER — Ambulatory Visit (INDEPENDENT_AMBULATORY_CARE_PROVIDER_SITE_OTHER): Payer: Medicaid Other | Admitting: Obstetrics

## 2017-09-03 ENCOUNTER — Other Ambulatory Visit (HOSPITAL_COMMUNITY)
Admission: RE | Admit: 2017-09-03 | Discharge: 2017-09-03 | Disposition: A | Discharge status: Home / Self Care | Payer: Medicaid Other | Source: Ambulatory Visit | Attending: Obstetrics | Admitting: Obstetrics

## 2017-09-03 ENCOUNTER — Encounter: Payer: Self-pay | Admitting: Obstetrics

## 2017-09-03 VITALS — BP 101/68 | HR 82 | Ht 61.0 in | Wt 143.0 lb

## 2017-09-03 DIAGNOSIS — Z3480 Encounter for supervision of other normal pregnancy, unspecified trimester: Secondary | ICD-10-CM | POA: Insufficient documentation

## 2017-09-03 DIAGNOSIS — Z3482 Encounter for supervision of other normal pregnancy, second trimester: Secondary | ICD-10-CM | POA: Diagnosis not present

## 2017-09-03 DIAGNOSIS — N898 Other specified noninflammatory disorders of vagina: Secondary | ICD-10-CM

## 2017-09-03 DIAGNOSIS — Z3A00 Weeks of gestation of pregnancy not specified: Secondary | ICD-10-CM | POA: Insufficient documentation

## 2017-09-03 DIAGNOSIS — O34219 Maternal care for unspecified type scar from previous cesarean delivery: Secondary | ICD-10-CM

## 2017-09-03 MED ORDER — TINIDAZOLE 500 MG PO TABS: 1000.0000 mg | ORAL_TABLET | Freq: Every day | ORAL | 2 refills | Status: DC

## 2017-09-03 MED ORDER — VITAFOL ULTRA 29-0.6-0.4-200 MG PO CAPS: 1.0000 | ORAL_CAPSULE | Freq: Every day | ORAL | 4 refills | Status: DC

## 2017-09-03 NOTE — Progress Notes (Signed)
Subjective:    Cindy Pearson is being seen today for her first obstetrical visit.  This is not a planned pregnancy. She is at [redacted]w[redacted]d gestation. Her obstetrical history is significant for intrauterine growth restriction (IUGR) and smoker. Relationship with FOB: significant other, living together. Patient does intend to breast feed. Pregnancy history fully reviewed.  The information documented in the HPI was reviewed and verified.  Menstrual History: OB History    Gravida  5   Para  3   Term  3   Preterm  0   AB  1   Living  3     SAB  1   TAB  0   Ectopic  0   Multiple  0   Live Births  3            Patient's last menstrual period was 05/03/2017 (approximate).    Past Medical History:  Diagnosis Date  . Asthma    Albuterol INH prn  . Blood transfusion without reported diagnosis   . Chlamydia 2012   hasn't picked up rx, not treated yet  . 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 Preterm labor 2011    Past Surgical History:  Procedure Laterality Date  . CESAREAN SECTION    . CESAREAN SECTION N/A 05/26/2016   Procedure: CESAREAN SECTION;  Surgeon: Lazaro Arms, MD;  Location: [facility] BIRTHING SUITES;  Service: Obstetrics;  Laterality: N/A;  . TONSILLECTOMY AND ADENOIDECTOMY       (Not in a hospital admission) Allergies  Allergen Reactions  . Penicillins Hives    Patient states that she is allergic to all penicillins. Has patient had a PCN reaction causing immediate rash, facial/tongue/throat swelling, SOB or lightheadedness with hypotension: YES Has patient had a PCN reaction causing severe rash involving mucus membranes or skin necrosis: NO Has patient had a PCN reaction that required hospitalization NO Has patient had a PCN reaction occurring within the last 10 years:NO If all of the above answers are "NO", then may proceed with Cephalosporin use.    Social History   Tobacco Use  . Smoking status: Former Smoker    Last attempt to quit: 08/20/2017   Years since quitting: 0.0  . Smokeless tobacco: Never Used  Substance Use Topics  . Alcohol use: No    Family History  Problem Relation Age of Onset  . Diabetes Maternal Grandmother   . Cancer Maternal Grandmother   . Heart disease Maternal Grandfather   . Heart disease Paternal Grandfather   . Cancer Father      Review of Systems Constitutional: negative for weight loss Gastrointestinal: negative for vomiting Genitourinary:negative for genital lesions and vaginal discharge and dysuria Musculoskeletal:negative for back pain Behavioral/Psych: negative for abusive relationship, depression, illegal drug usage and tobacco use    Objective:    Ht 5\' 1"  (1.549 m)   Wt 143 lb (64.9 kg)   LMP 05/03/2017 (Approximate)   BMI 27.02 kg/m  General Appearance:    Alert, cooperative, no distress, appears stated age  Head:    Normocephalic, without obvious abnormality, atraumatic  Eyes:    PERRL, conjunctiva/corneas clear, EOM's intact, fundi    benign, both eyes  Ears:    Normal TM's and external ear canals, both ears  Nose:   Nares normal, septum midline, mucosa normal, no drainage    or sinus tenderness  Throat:   Lips, mucosa, and tongue normal; teeth and gums normal  Neck:   Supple, symmetrical,  trachea midline, no adenopathy;    thyroid:  no enlargement/tenderness/nodules; no carotid   bruit or JVD  Back:     Symmetric, no curvature, ROM normal, no CVA tenderness  Lungs:     Clear to auscultation bilaterally, respirations unlabored  Chest Wall:    No tenderness or deformity   Heart:    Regular rate and rhythm, S1 and S2 normal, no murmur, rub   or gallop  Breast Exam:    No tenderness, masses, or nipple abnormality  Abdomen:     Soft, non-tender, bowel sounds active all four quadrants,    no masses, no organomegaly  Genitalia:    Normal female without lesion, discharge or tenderness  Extremities:   Extremities normal, atraumatic, no cyanosis or edema  Pulses:   2+ and symmetric  all extremities  Skin:   Skin color, texture, turgor normal, no rashes or lesions  Lymph nodes:   Cervical, supraclavicular, and axillary nodes normal  Neurologic:   CNII-XII intact, normal strength, sensation and reflexes    throughout      Lab Review Urine pregnancy test Labs reviewed yes Radiologic studies reviewed no    Assessment:    Pregnancy at 2950w4d weeks    Plan:   1. Supervision of other normal pregnancy, antepartum Rx: - Cytology - PAP - Obstetric Panel, Including HIV - Hemoglobinopathy evaluation - Genetic Screening - Culture, OB Urine - Enroll Patient in Babyscripts - US MFM OB COMP + 14 WK; Future - Prenat-Fe Poly-Methfol-FA-DHA (VITAFOL ULTRA) 29-0.6-0.4-200 MG CAPS; Take 1 capsule by mouth daily before breakfast.  Dispense: 90 capsule; Refill: 4  2. Previous cesarean section complicating pregnancy, antepartum condition or complication - for repeat C/S  3. Vaginal discharge Rx: - Cervicovaginal ancillary only - tinidazole (TINDAMAX) 500 MG tablet; Take 2 tablets (1,000 mg total) by mouth daily with breakfast.  Dispense: 10 tablet; Refill: 2    Prenatal vitamins.  Counseling provided regarding continued use of seat belts, cessation of alcohol consumption, smoking or use of illicit drugs; infection precautions i.e., influenza/TDAP immunizations, toxoplasmosis,CMV, parvovirus, listeria and varicella; workplace safety, exercise during pregnancy; routine dental care, safe medications, sexual activity, hot tubs, saunas, pools, travel, caffeine use, fish and methlymercury, potential toxins, hair treatments, varicose veins Weight gain recommendations per IOM guidelines reviewed: underweight/BMI< 18.5--> gain 28 - 40 lbs; normal weight/BMI 18.5 - 24.9--> gain 25 - 35 lbs; overweight/BMI 25 - 29.9--> gain 15 - 25 lbs; obese/BMI >30->gain  11 - 20 lbs Problem list reviewed and updated. FIRST/CF mutation testing/NIPT/QUAD SCREEN/fragile X/Ashkenazi Jewish population  testing/Spinal muscular atrophy discussed: requested. Role of ultrasound in pregnancy discussed; fetal survey: requested. Amniocentesis discussed: not indicated.  No orders of the defined types were placed in this encounter.  Orders Placed This Encounter  Procedures  . Culture, OB Urine  . US MFM OB COMP + 14 WK    Standing Status:   Future    Standing Expiration Date:   11/04/2018    Order Specific Question:   Reason for Exam (SYMPTOM  OR DIAGNOSIS REQUIRED)    Answer:   Anatomic Survey    Order Specific Question:   Preferred Imaging Location?    Answer:   [facility]-Ultrasound  . Obstetric Panel, Including HIV  . Hemoglobinopathy evaluation  . Genetic Screening    Panorama    Follow up in 4 weeks. 50% of 20 min visit spent on counseling and coordination of care.     Brock BadHARLES A. Tamanika Heiney MD 09-03-2017

## 2017-09-03 NOTE — Progress Notes (Signed)
Patient is in the office for initial ob visit, unplanned pregnancy. Living with FOB. Pt reports not feeling fetal movement yet complains of occasional pressure.

## 2017-09-03 NOTE — Addendum Note (Signed)
Addended by: Natale MilchSTALLING, Adaly Puder D on: 09/03/2017 02:39 PM   Modules accepted: Orders

## 2017-09-04 ENCOUNTER — Encounter (HOSPITAL_COMMUNITY): Payer: Self-pay

## 2017-09-04 ENCOUNTER — Other Ambulatory Visit: Payer: Self-pay | Admitting: Obstetrics

## 2017-09-04 LAB — CYTOLOGY - PAP: Diagnosis: NEGATIVE

## 2017-09-05 ENCOUNTER — Other Ambulatory Visit: Payer: Self-pay | Admitting: Obstetrics

## 2017-09-05 LAB — OBSTETRIC PANEL, INCLUDING HIV
Antibody Screen: NEGATIVE
Basophils Absolute: 0 10*3/uL (ref 0.0–0.2)
Basos: 0 %
EOS (ABSOLUTE): 0.1 10*3/uL (ref 0.0–0.4)
Eos: 2 %
HIV Screen 4th Generation wRfx: NONREACTIVE
Hematocrit: 36.3 % (ref 34.0–46.6)
Hemoglobin: 12.1 g/dL (ref 11.1–15.9)
Hepatitis B Surface Ag: NEGATIVE
Immature Grans (Abs): 0 10*3/uL (ref 0.0–0.1)
Immature Granulocytes: 0 %
Lymphocytes Absolute: 1.5 10*3/uL (ref 0.7–3.1)
Lymphs: 29 %
MCH: 29.2 pg (ref 26.6–33.0)
MCHC: 33.3 g/dL (ref 31.5–35.7)
MCV: 88 fL (ref 79–97)
MONOCYTES: 10 %
Monocytes Absolute: 0.5 10*3/uL (ref 0.1–0.9)
NEUTROS ABS: 3.1 10*3/uL (ref 1.4–7.0)
Neutrophils: 59 %
Platelets: 162 10*3/uL (ref 150–450)
RBC: 4.14 x10E6/uL (ref 3.77–5.28)
RDW: 12.9 % (ref 12.3–15.4)
RH TYPE: POSITIVE
RPR Ser Ql: NONREACTIVE
RUBELLA: 2 {index} (ref >0.99)
WBC: 5.2 10*3/uL (ref 3.4–10.8)

## 2017-09-05 LAB — HEMOGLOBINOPATHY EVALUATION
HGB C: 0 %
HGB S: 0 %
HGB VARIANT: 0 %
Hemoglobin A2 Quantitation: 2.3 % (ref 1.8–3.2)
Hemoglobin F Quantitation: 0 % (ref 0.0–2.0)
Hgb A: 97.7 % (ref 96.4–98.8)

## 2017-09-05 LAB — CERVICOVAGINAL ANCILLARY ONLY
BACTERIAL VAGINITIS: POSITIVE — AB
CANDIDA VAGINITIS: NEGATIVE
Chlamydia: POSITIVE — AB
Neisseria Gonorrhea: NEGATIVE
TRICH (WINDOWPATH): POSITIVE — AB

## 2017-09-05 LAB — CULTURE, OB URINE

## 2017-09-05 LAB — URINE CULTURE, OB REFLEX

## 2017-09-06 ENCOUNTER — Other Ambulatory Visit: Payer: Self-pay | Admitting: Obstetrics

## 2017-09-06 DIAGNOSIS — A749 Chlamydial infection, unspecified: Secondary | ICD-10-CM

## 2017-09-06 MED ORDER — AZITHROMYCIN 500 MG PO TABS: 1000.0000 mg | ORAL_TABLET | Freq: Once | ORAL | 0 refills | Status: DC

## 2017-09-06 MED ORDER — CEFIXIME 400 MG PO CAPS: 400.0000 mg | ORAL_CAPSULE | Freq: Once | ORAL | 0 refills | Status: AC

## 2017-09-11 ENCOUNTER — Ambulatory Visit (HOSPITAL_COMMUNITY): Payer: Medicaid Other

## 2017-09-12 ENCOUNTER — Encounter: Payer: Self-pay | Admitting: Obstetrics

## 2017-09-12 ENCOUNTER — Ambulatory Visit (HOSPITAL_COMMUNITY): Payer: Medicaid Other | Attending: Obstetrics

## 2017-09-16 LAB — SMN1 COPY NUMBER ANALYSIS (SMA CARRIER SCREENING)

## 2017-09-22 ENCOUNTER — Encounter (HOSPITAL_COMMUNITY): Payer: Self-pay

## 2017-09-29 ENCOUNTER — Ambulatory Visit (HOSPITAL_COMMUNITY): Payer: Medicaid Other

## 2017-10-01 ENCOUNTER — Telehealth (HOSPITAL_COMMUNITY): Payer: Self-pay | Admitting: Emergency Medicine

## 2017-10-01 ENCOUNTER — Encounter: Payer: Self-pay | Admitting: Obstetrics and Gynecology

## 2017-10-01 ENCOUNTER — Encounter: Payer: Self-pay | Admitting: Obstetrics

## 2017-10-01 ENCOUNTER — Ambulatory Visit (HOSPITAL_COMMUNITY)
Admission: EM | Admit: 2017-10-01 | Discharge: 2017-10-01 | Disposition: A | Discharge status: Home / Self Care | Payer: Medicaid Other | Attending: Family Medicine | Admitting: Family Medicine

## 2017-10-01 DIAGNOSIS — A749 Chlamydial infection, unspecified: Secondary | ICD-10-CM

## 2017-10-01 DIAGNOSIS — A599 Trichomoniasis, unspecified: Secondary | ICD-10-CM | POA: Insufficient documentation

## 2017-10-01 MED ORDER — AZITHROMYCIN 250 MG PO TABS
ORAL_TABLET | ORAL | Status: AC
  Filled 2017-10-01: qty 4

## 2017-10-01 MED ORDER — METRONIDAZOLE 500 MG PO TABS: 500.0000 mg | ORAL_TABLET | Freq: Two times a day (BID) | ORAL | 0 refills | Status: DC

## 2017-10-01 MED ORDER — AZITHROMYCIN 250 MG PO TABS
1000.0000 mg | ORAL_TABLET | Freq: Once | ORAL | Status: AC
  Administered 2017-10-01: 1000 mg via ORAL

## 2017-10-01 NOTE — ED Notes (Signed)
Pt is 5 months pregnant, states she was tested for STDs last month and checked her mychart and was positive for trich and chlamydia. Pt requesting treatment for those. Declines rocephin shot. Pt received shot back in June without any reaction.

## 2017-10-02 ENCOUNTER — Telehealth: Payer: Self-pay

## 2017-10-02 NOTE — Telephone Encounter (Signed)
Attempted to contact, left vm to call 

## 2017-10-08 ENCOUNTER — Ambulatory Visit (HOSPITAL_COMMUNITY)
Admission: RE | Admit: 2017-10-08 | Discharge: 2017-10-08 | Disposition: A | Discharge status: Home / Self Care | Payer: Medicaid Other | Source: Ambulatory Visit | Attending: Obstetrics | Admitting: Obstetrics

## 2017-10-08 ENCOUNTER — Other Ambulatory Visit: Payer: Self-pay | Admitting: Obstetrics

## 2017-10-08 DIAGNOSIS — O34219 Maternal care for unspecified type scar from previous cesarean delivery: Secondary | ICD-10-CM | POA: Diagnosis not present

## 2017-10-08 DIAGNOSIS — Z3480 Encounter for supervision of other normal pregnancy, unspecified trimester: Secondary | ICD-10-CM

## 2017-10-08 DIAGNOSIS — O09292 Supervision of pregnancy with other poor reproductive or obstetric history, second trimester: Secondary | ICD-10-CM | POA: Diagnosis not present

## 2017-10-08 DIAGNOSIS — Z3A21 21 weeks gestation of pregnancy: Secondary | ICD-10-CM | POA: Diagnosis not present

## 2017-10-08 DIAGNOSIS — Z363 Encounter for antenatal screening for malformations: Secondary | ICD-10-CM | POA: Insufficient documentation

## 2017-10-08 DIAGNOSIS — O09299 Supervision of pregnancy with other poor reproductive or obstetric history, unspecified trimester: Secondary | ICD-10-CM

## 2017-10-09 ENCOUNTER — Inpatient Hospital Stay (HOSPITAL_COMMUNITY)
Admission: AD | Admit: 2017-10-09 | Discharge: 2017-10-09 | Disposition: A | Discharge status: Home / Self Care | Payer: Medicaid Other | Source: Ambulatory Visit | Attending: Family Medicine | Admitting: Family Medicine

## 2017-10-09 ENCOUNTER — Encounter (HOSPITAL_COMMUNITY): Payer: Self-pay | Admitting: *Deleted

## 2017-10-09 DIAGNOSIS — O212 Late vomiting of pregnancy: Secondary | ICD-10-CM | POA: Diagnosis not present

## 2017-10-09 DIAGNOSIS — Z79899 Other long term (current) drug therapy: Secondary | ICD-10-CM | POA: Diagnosis not present

## 2017-10-09 DIAGNOSIS — J45909 Unspecified asthma, uncomplicated: Secondary | ICD-10-CM | POA: Diagnosis not present

## 2017-10-09 DIAGNOSIS — Z87891 Personal history of nicotine dependence: Secondary | ICD-10-CM | POA: Diagnosis not present

## 2017-10-09 DIAGNOSIS — R102 Pelvic and perineal pain: Secondary | ICD-10-CM | POA: Insufficient documentation

## 2017-10-09 DIAGNOSIS — O219 Vomiting of pregnancy, unspecified: Secondary | ICD-10-CM | POA: Diagnosis not present

## 2017-10-09 DIAGNOSIS — O26892 Other specified pregnancy related conditions, second trimester: Secondary | ICD-10-CM | POA: Diagnosis not present

## 2017-10-09 DIAGNOSIS — Z3A22 22 weeks gestation of pregnancy: Secondary | ICD-10-CM

## 2017-10-09 DIAGNOSIS — O99512 Diseases of the respiratory system complicating pregnancy, second trimester: Secondary | ICD-10-CM | POA: Insufficient documentation

## 2017-10-09 DIAGNOSIS — Z880 Allergy status to penicillin: Secondary | ICD-10-CM | POA: Insufficient documentation

## 2017-10-09 DIAGNOSIS — R1110 Vomiting, unspecified: Secondary | ICD-10-CM | POA: Diagnosis present

## 2017-10-09 LAB — URINALYSIS, ROUTINE W REFLEX MICROSCOPIC
Bacteria, UA: NONE SEEN
Bilirubin Urine: NEGATIVE
GLUCOSE, UA: NEGATIVE mg/dL
Hgb urine dipstick: NEGATIVE
KETONES UR: 80 mg/dL — AB
Nitrite: NEGATIVE
PH: 6 (ref 5.0–8.0)
Protein, ur: 30 mg/dL — AB
Specific Gravity, Urine: 1.024 (ref 1.005–1.030)

## 2017-10-09 LAB — WET PREP, GENITAL
Clue Cells Wet Prep HPF POC: NONE SEEN
Sperm: NONE SEEN
TRICH WET PREP: NONE SEEN
Yeast Wet Prep HPF POC: NONE SEEN

## 2017-10-09 MED ORDER — PROMETHAZINE HCL 25 MG PO TABS: 12.5000 mg | ORAL_TABLET | Freq: Four times a day (QID) | ORAL | 0 refills | Status: DC | PRN

## 2017-10-09 MED ORDER — PROMETHAZINE HCL 25 MG PO TABS
25.0000 mg | ORAL_TABLET | Freq: Once | ORAL | Status: AC
  Administered 2017-10-09: 25 mg via ORAL
  Filled 2017-10-09: qty 1

## 2017-10-09 NOTE — Progress Notes (Signed)
Vaginal swabs done by H. Mathews Robinsons, CNM

## 2017-10-09 NOTE — Discharge Instructions (Signed)
Morning Sickness °Morning sickness is when you feel sick to your stomach (nauseous) during pregnancy. This nauseous feeling may or may not come with vomiting. It often occurs in the morning but can be a problem any time of day. Morning sickness is most common during the first trimester, but it may continue throughout pregnancy. While morning sickness is unpleasant, it is usually harmless unless you develop severe and continual vomiting (hyperemesis gravidarum). This condition requires more intense treatment. °What are the causes? °The cause of morning sickness is not completely known but seems to be related to normal hormonal changes that occur in pregnancy. °What increases the risk? °You are at greater risk if you: °· Experienced nausea or vomiting before your pregnancy. °· Had morning sickness during a previous pregnancy. °· Are pregnant with more than one baby, such as twins. ° °How is this treated? °Do not use any medicines (prescription, over-the-counter, or herbal) for morning sickness without first talking to your health care provider. Your health care provider may prescribe or recommend: °· Vitamin B6 supplements. °· Anti-nausea medicines. °· The herbal medicine ginger. ° °Follow these instructions at home: °· Only take over-the-counter or prescription medicines as directed by your health care provider. °· Taking multivitamins before getting pregnant can prevent or decrease the severity of morning sickness in most women. °· Eat a piece of dry toast or unsalted crackers before getting out of bed in the morning. °· Eat five or six small meals a day. °· Eat dry and bland foods (rice, baked potato). Foods high in carbohydrates are often helpful. °· Do not drink liquids with your meals. Drink liquids between meals. °· Avoid greasy, fatty, and spicy foods. °· Get someone to cook for you if the smell of any food causes nausea and vomiting. °· If you feel nauseous after taking prenatal vitamins, take the vitamins at  night or with a snack. °· Snack on protein foods (nuts, yogurt, cheese) between meals if you are hungry. °· Eat unsweetened gelatins for desserts. °· Wearing an acupressure wristband (worn for sea sickness) may be helpful. °· Acupuncture may be helpful. °· Do not smoke. °· Get a humidifier to keep the air in your house free of odors. °· Get plenty of fresh air. °Contact a health care provider if: °· Your home remedies are not working, and you need medicine. °· You feel dizzy or lightheaded. °· You are losing weight. °Get help right away if: °· You have persistent and uncontrolled nausea and vomiting. °· You pass out (faint). °This information is not intended to replace advice given to you by your health care provider. Make sure you discuss any questions you have with your health care provider. °Document Released: 02/14/2006 Document Revised: 06/01/2015 Document Reviewed: 06/10/2012 °Elsevier Interactive Patient Education © 2017 [employer] ° °

## 2017-10-09 NOTE — MAU Provider Note (Signed)
History     CSN: [id number]  Arrival date and time: 10/09/17 1218   First Provider Initiated Contact with Patient 10/09/17 1258      Chief Complaint  Patient presents with  . Abdominal Pain  . Emesis  . Nausea   Emesis   This is a new problem. The current episode started yesterday. The problem occurs 5 to 10 times per day. The problem has been unchanged. The emesis has an appearance of stomach contents. There has been no fever. Pertinent negatives include no chills, diarrhea or fever. Risk factors: pregnancy  She has tried nothing for the symptoms.  Pelvic Pain  The patient's primary symptoms include pelvic pain. The patient's pertinent negatives include no vaginal discharge. This is a new problem. The current episode started yesterday. The problem occurs constantly. The problem has been unchanged. Pain severity now: 6/10. The problem affects the left side. Associated symptoms include dysuria, nausea, urgency and vomiting. Pertinent negatives include no chills, constipation (last BM 10/09/17 ), diarrhea, fever or frequency. The vaginal discharge was normal. There has been no bleeding. The symptoms are aggravated by urinating. She has tried nothing for the symptoms.    OB History    Gravida  5   Para  3   Term  3   Preterm  0   AB  1   Living  3     SAB  1   TAB  0   Ectopic  0   Multiple  0   Live Births  3           Past Medical History:  Diagnosis Date  . Asthma    Albuterol INH prn  . Blood transfusion without reported diagnosis   . Chlamydia 2012   hasn't picked up rx, not treated yet  . 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 Preterm labor 2011    Past Surgical History:  Procedure Laterality Date  . CESAREAN SECTION    . CESAREAN SECTION N/A 05/26/2016   Procedure: CESAREAN SECTION;  Surgeon: Lazaro Arms, MD;  Location: [facility] BIRTHING SUIT[facility];  Service: Obstetrics;  Laterality: N/A;  . TONSILLECTOMY AND ADENOIDECTOMY      Family History   Problem Relation Age of Onset  . Diabetes Maternal Grandmother   . Heart disease Maternal Grandfather   . Cancer Maternal Grandfather   . Heart disease Paternal Grandfather   . Cancer Father     Social History   Tobacco Use  . Smoking status: Former Smoker    Last attempt to quit: 08/20/2017    Years since quitting: 0.1  . Smokeless tobacco: Never Used  Substance Use Topics  . Alcohol use: Not Currently    Comment: occasional  . Drug use: Yes    Types: Marijuana    Comment: last smoked in September 2019    Allergies:  Allergies  Allergen Reactions  . Penicillins Hives    Patient states that she is allergic to all penicillins. Has patient had a PCN reaction causing immediate rash, facial/tongue/throat swelling, SOB or lightheadedness with hypotension: YES Has patient had a PCN reaction causing severe rash involving mucus membranes or skin necrosis: NO Has patient had a PCN reaction that required hospitalization NO Has patient had a PCN reaction occurring within the last 10 years:NO If all of the above answers are "NO", then may proceed with Cephalosporin use.    Medications Prior to Admission  Medication Sig Dispense Refill Last Dose  . metroNIDAZOLE (  FLAGYL) 500 MG tablet Take 1 tablet (500 mg total) by mouth 2 (two) times daily. 14 tablet 0 10/09/2017 at Unknown time  . Prenatal Vit-Fe Fumarate-FA (PRENATAL MULTIVITAMIN) TABS tablet Take 1 tablet by mouth daily at 12 noon.   Past Week at Unknown time  . ondansetron (ZOFRAN ODT) 8 MG disintegrating tablet Take 1 tablet (8 mg total) by mouth every 8 (eight) hours as needed for nausea or vomiting. (Patient not taking: Reported on 09/03/2017) 20 tablet 3 Not Taking at Unknown time  . promethazine (PHENERGAN) 25 MG tablet Take 0.5-1 tablets (12.5-25 mg total) by mouth every 6 (six) hours as needed. (Patient not taking: Reported on 09/03/2017) 30 tablet 3 Not Taking at Unknown time  . tinidazole (TINDAMAX) 500 MG tablet Take 2  tablets (1,000 mg total) by mouth daily with breakfast. (Patient not taking: Reported on 10/09/2017) 10 tablet 2 Not Taking at Unknown time    Review of Systems  Constitutional: Negative for chills and fever.  Gastrointestinal: Positive for nausea and vomiting. Negative for constipation (last BM 10/09/17 ) and diarrhea.  Genitourinary: Positive for dysuria, pelvic pain and urgency. Negative for frequency, vaginal bleeding and vaginal discharge.   Physical Exam   Blood pressure (!) 95/57, pulse 80, temperature 98.2 F (36.8 C), temperature source Oral, resp. rate 16, height 5\' 1"  (1.549 m), weight 64 kg, last menstrual period 05/03/2017, SpO2 100 %, not currently breastfeeding.  Physical Exam  Nursing note and vitals reviewed. Constitutional: She is oriented to person, place, and time. She appears well-developed and well-nourished. No distress.  HENT:  Head: Normocephalic.  Cardiovascular: Normal rate.  Respiratory: Effort normal.  GI: Soft. There is no tenderness. There is no rebound.  Genitourinary:  Genitourinary Comments: Cervix: closed/thick/posterior   Neurological: She is alert and oriented to person, place, and time.  Skin: Skin is warm and dry.  Psychiatric: She has a normal mood and affect.  +FHT 143 with doppler  Results for orders placed or performed during the hospital encounter of 10/09/17 (from the past 24 hour(s))  Wet prep, genital     Status: Abnormal   Collection Time: 10/09/17  2:04 PM  Result Value Ref Range   Yeast Wet Prep HPF POC NONE SEEN NONE SEEN   Trich, Wet Prep NONE SEEN NONE SEEN   Clue Cells Wet Prep HPF POC NONE SEEN NONE SEEN   WBC, Wet Prep HPF POC FEW (A) NONE SEEN   Sperm NONE SEEN   Urinalysis, Routine w reflex microscopic     Status: Abnormal   Collection Time: 10/09/17  4:25 PM  Result Value Ref Range   Color, Urine YELLOW YELLOW   APPearance HAZY (A) CLEAR   Specific Gravity, Urine 1.024 1.005 - 1.030   pH 6.0 5.0 - 8.0   Glucose, UA  NEGATIVE NEGATIVE mg/dL   Hgb urine dipstick NEGATIVE NEGATIVE   Bilirubin Urine NEGATIVE NEGATIVE   Ketones, ur 80 (A) NEGATIVE mg/dL   Protein, ur 30 (A) NEGATIVE mg/dL   Nitrite NEGATIVE NEGATIVE   Leukocytes, UA TRACE (A) NEGATIVE   RBC / HPF 0-5 0 - 5 RBC/hpf   WBC, UA 0-5 0 - 5 WBC/hpf   Bacteria, UA NONE SEEN NONE SEEN   Squamous Epithelial / LPF 0-5 0 - 5   Mucus PRESENT     MAU Course  Procedures  MDM  Patient has had phenergan here. She has not vomited here, and is tolerating PO food and fluids.   Assessment and Plan  1. Nausea/vomiting in pregnancy   2. [redacted] weeks gestation of pregnancy    DC home Comfort measures reviewed  2nd Trimester precautions  Fetal kick counts  RX: phenergan PRN #[id number]  Return to MAU as needed FU with OB as planned  Follow-up Information    [facility] Follow up.   Contact information: [address] [place] [address] [phone]           Thressa Sheller 10/09/2017, 5:18 PM

## 2017-10-09 NOTE — MAU Note (Signed)
Pt presents with left lower abd pain and pressure since yesterday, nausea and vomiting since pain started.

## 2017-10-10 LAB — GC/CHLAMYDIA PROBE AMP (~~LOC~~) NOT AT ARMC
Chlamydia: NEGATIVE
Neisseria Gonorrhea: NEGATIVE

## 2017-10-13 ENCOUNTER — Other Ambulatory Visit (HOSPITAL_COMMUNITY): Payer: Self-pay | Admitting: *Deleted

## 2017-10-13 DIAGNOSIS — O09292 Supervision of pregnancy with other poor reproductive or obstetric history, second trimester: Secondary | ICD-10-CM

## 2017-10-27 ENCOUNTER — Encounter: Payer: Medicaid Other | Admitting: Obstetrics

## 2017-11-04 ENCOUNTER — Encounter: Payer: Medicaid Other | Admitting: Obstetrics

## 2017-11-05 ENCOUNTER — Ambulatory Visit (HOSPITAL_COMMUNITY): Payer: Medicaid Other | Attending: Obstetrics

## 2017-11-05 ENCOUNTER — Encounter (HOSPITAL_COMMUNITY): Payer: Self-pay

## 2017-11-11 ENCOUNTER — Encounter: Payer: Self-pay | Admitting: Obstetrics

## 2017-11-19 ENCOUNTER — Inpatient Hospital Stay (HOSPITAL_COMMUNITY)
Admission: AD | Admit: 2017-11-19 | Discharge: 2017-11-19 | Disposition: A | Discharge status: Home / Self Care | Payer: Medicaid Other | Source: Ambulatory Visit | Attending: Obstetrics & Gynecology | Admitting: Obstetrics & Gynecology

## 2017-11-19 ENCOUNTER — Other Ambulatory Visit: Payer: Self-pay

## 2017-11-19 ENCOUNTER — Encounter (HOSPITAL_COMMUNITY): Payer: Self-pay | Admitting: Emergency Medicine

## 2017-11-19 DIAGNOSIS — Z87891 Personal history of nicotine dependence: Secondary | ICD-10-CM | POA: Diagnosis not present

## 2017-11-19 DIAGNOSIS — O26892 Other specified pregnancy related conditions, second trimester: Secondary | ICD-10-CM | POA: Insufficient documentation

## 2017-11-19 DIAGNOSIS — Z880 Allergy status to penicillin: Secondary | ICD-10-CM | POA: Insufficient documentation

## 2017-11-19 DIAGNOSIS — R109 Unspecified abdominal pain: Secondary | ICD-10-CM | POA: Diagnosis present

## 2017-11-19 DIAGNOSIS — R102 Pelvic and perineal pain: Secondary | ICD-10-CM

## 2017-11-19 DIAGNOSIS — Z3A27 27 weeks gestation of pregnancy: Secondary | ICD-10-CM | POA: Insufficient documentation

## 2017-11-19 LAB — URINALYSIS, ROUTINE W REFLEX MICROSCOPIC
BILIRUBIN URINE: NEGATIVE
GLUCOSE, UA: NEGATIVE mg/dL
HGB URINE DIPSTICK: NEGATIVE
Ketones, ur: NEGATIVE mg/dL
Nitrite: NEGATIVE
PROTEIN: NEGATIVE mg/dL
Specific Gravity, Urine: 1.025 (ref 1.005–1.030)
pH: 6 (ref 5.0–8.0)

## 2017-11-19 LAB — WET PREP, GENITAL
Clue Cells Wet Prep HPF POC: NONE SEEN
SPERM: NONE SEEN
Trich, Wet Prep: NONE SEEN
YEAST WET PREP: NONE SEEN

## 2017-11-19 MED ORDER — COMFORT FIT MATERNITY SUPP MED MISC: 1.0000 | Freq: Every day | 0 refills | Status: DC

## 2017-11-19 NOTE — MAU Note (Addendum)
Pt presents to MAU with complaints of pressure in the lower part of her abdomen with pain radiating into her back. Denies any VB, reports leakage of fluid on and off at times.

## 2017-11-19 NOTE — Discharge Instructions (Signed)

## 2017-11-19 NOTE — MAU Provider Note (Addendum)
History     CSN: [id number]  Arrival date and time: 11/19/17 1100   First Provider Initiated Contact with Patient 11/19/17 1153      Chief Complaint  Patient presents with  . Abdominal Pain   Gwynne EdingerKiara M Neyra is a 22 y.o. [phone] at 4912w6d presenting with complaints of intermittent pelvic pressure and lower back pain, consistent with Braxton-Hicks, as well as occasionally leaking clear fluid after a strong BH.  Fluid is clear and non-odorous, she thinks it is likely urine. Denies bleeding or abnormal vaginal discharge, urinary frequency or dysuria, flank pain, or abdominal pain.  Has some intermittent nausea/vomiting but has been fine today, and has eaten breakfast. Good FM. She has only attended one PNV due to her work schedule, plans to make an appointment "as soon as possible."      Past Medical History:  Diagnosis Date  . Asthma    Albuterol INH prn  . Blood transfusion without reported diagnosis   . Chlamydia 2012   hasn't picked up rx, not treated yet  . 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 Preterm labor 2011    Past Surgical History:  Procedure Laterality Date  . CESAREAN SECTION    . CESAREAN SECTION N/A 05/26/2016   Procedure: CESAREAN SECTION;  Surgeon: Lazaro ArmsEure, Luther H, MD;  [facility];  Service: Obstetrics;  Laterality: N/A;  . TONSILLECTOMY AND ADENOIDECTOMY      Family History  Problem Relation Age of Onset  . Diabetes Maternal Grandmother   . Heart disease Maternal Grandfather   . Cancer Maternal Grandfather   . Heart disease Paternal Grandfather   . Cancer Father     Social History   Tobacco Use  . Smoking status: Former Smoker    Last attempt to quit: 08/20/2017    Years since quitting: 0.2  . Smokeless tobacco: Never Used  Substance Use Topics  . Alcohol use: Not Currently    Comment: occasional  . Drug use: Yes    Types: Marijuana    Comment: last smoked in September 2019    Allergies:  Allergies  Allergen Reactions   . Penicillins Hives    Patient states that she is allergic to all penicillins. Has patient had a PCN reaction causing immediate rash, facial/tongue/throat swelling, SOB or lightheadedness with hypotension: YES Has patient had a PCN reaction causing severe rash involving mucus membranes or skin necrosis: NO Has patient had a PCN reaction that required hospitalization NO Has patient had a PCN reaction occurring within the last 10 years:NO If all of the above answers are "NO", then may proceed with Cephalosporin use.    Medications Prior to Admission  Medication Sig Dispense Refill Last Dose  . acetaminophen (TYLENOL) 500 MG tablet Take 500-1,000 mg by mouth every 6 (six) hours as needed for headache.   Past Week at Unknown time  . promethazine (PHENERGAN) 25 MG tablet Take 0.5-1 tablets (12.5-25 mg total) by mouth every 6 (six) hours as needed. 30 tablet 0 Past Week at Unknown time  . metroNIDAZOLE (FLAGYL) 500 MG tablet Take 1 tablet (500 mg total) by mouth 2 (two) times daily. (Patient not taking: Reported on 11/19/2017) 14 tablet 0 Completed Course at Unknown time    Review of Systems  Constitutional: Negative.   HENT: Negative.   Eyes: Negative.   Respiratory: Negative.   Cardiovascular: Negative.   Gastrointestinal: Positive for nausea. Negative for vomiting. Diarrhea: intermittent.  Endocrine: Negative.   Genitourinary: Negative.  Negative for dysuria, flank pain, frequency, hematuria, urgency, vaginal bleeding and vaginal pain.  Musculoskeletal: Positive for back pain.  Skin: Negative.   Allergic/Immunologic: Negative.   Neurological: Negative.   Hematological: Negative.   Psychiatric/Behavioral: Negative.    Physical Exam   Blood pressure (!) 104/57, pulse 75, resp. rate 17, weight 68 kg, last menstrual period 05/03/2017, SpO2 98 %, not currently breastfeeding.  Physical Exam  Nursing note and vitals reviewed. Constitutional: She is oriented to person, place, and time.  She appears well-developed and well-nourished.  HENT:  Head: Normocephalic.  Cardiovascular: Normal rate and regular rhythm.  Respiratory: Effort normal.  GI: Soft.  Genitourinary: Vagina normal and uterus normal.  Genitourinary Comments: SSE: no pool, fern neg SVE: closed/thick  Neurological: She is alert and oriented to person, place, and time. She has normal reflexes.  Skin: Skin is warm and dry.  Psychiatric: She has a normal mood and affect. Her behavior is normal. Judgment and thought content normal.  EFM: 135 bpm, mod variability, + accels, no decels Toco: none  Results for orders placed or performed during the hospital encounter of 11/19/17 (from the past 24 hour(s))  Wet prep, genital     Status: Abnormal   Collection Time: 11/19/17 12:28 PM  Result Value Ref Range   Yeast Wet Prep HPF POC NONE SEEN NONE SEEN   Trich, Wet Prep NONE SEEN NONE SEEN   Clue Cells Wet Prep HPF POC NONE SEEN NONE SEEN   WBC, Wet Prep HPF POC MANY (A) NONE SEEN   Sperm NONE SEEN   Urinalysis, Routine w reflex microscopic     Status: Abnormal   Collection Time: 11/19/17 12:44 PM  Result Value Ref Range   Color, Urine YELLOW YELLOW   APPearance CLOUDY (A) CLEAR   Specific Gravity, Urine 1.025 1.005 - 1.030   pH 6.0 5.0 - 8.0   Glucose, UA NEGATIVE NEGATIVE mg/dL   Hgb urine dipstick NEGATIVE NEGATIVE   Bilirubin Urine NEGATIVE NEGATIVE   Ketones, ur NEGATIVE NEGATIVE mg/dL   Protein, ur NEGATIVE NEGATIVE mg/dL   Nitrite NEGATIVE NEGATIVE   Leukocytes, UA TRACE (A) NEGATIVE   RBC / HPF 0-5 0 - 5 RBC/hpf   WBC, UA 6-10 0 - 5 WBC/hpf   Bacteria, UA RARE (A) NONE SEEN   Squamous Epithelial / LPF 0-5 0 - 5   Mucus PRESENT    MAU Course  Procedures  MDM History of PTL (but term delivery) and IUGR.  G/C, wet prep (negative), UA (trace leuko, neg nitrates), Urine C&S sent No s/sx of UTI, no CVA, CMT, or pooling.   Assessment and Plan  [redacted] weeks gestation Pelvic girdle pain. Maternity  belt prescription given. Follow up with [facility]. PTL precautions  Donette Larry 11/19/2017, 2:11 PM   Midwife attestation I have seen and examined this patient and agree with above documentation in the student's note.   MARLIA SCHEWE is a 22 y.o. [phone] here with pelvic pressure and back pain  PE:  Gen: well appearing Heart: reg rate Lungs: normal WOB Ext: no pain, no edema  MDM: No PTL or UTI. Pain likely physiologic to advance pregnancy and multiparity. Recommend comfort measure including maternity belt.   Assessment 1. [redacted] weeks gestation of pregnancy   2. Pelvic pain affecting pregnancy in second trimester, antepartum    Plan: Discharge home Follow up at [facility] asap PTL precautions Rx Maternity belt  Allergies as of 11/19/2017      Reactions  Penicillins Hives   Patient states that she is allergic to all penicillins. Has patient had a PCN reaction causing immediate rash, facial/tongue/throat swelling, SOB or lightheadedness with hypotension: YES Has patient had a PCN reaction causing severe rash involving mucus membranes or skin necrosis: NO Has patient had a PCN reaction that required hospitalization NO Has patient had a PCN reaction occurring within the last 10 years:NO If all of the above answers are "NO", then may proceed with Cephalosporin use.      Medication List    STOP taking these medications   metroNIDAZOLE 500 MG tablet Commonly known as:  FLAGYL     TAKE these medications   acetaminophen 500 MG tablet Commonly known as:  TYLENOL Take 500-1,000 mg by mouth every 6 (six) hours as needed for headache.   COMFORT FIT MATERNITY SUPP MED Misc 1 Device by Does not apply route daily.   promethazine 25 MG tablet Commonly known as:  PHENERGAN Take 0.5-1 tablets (12.5-25 mg total) by mouth every 6 (six) hours as needed.      Donette Larry, CNM 2:11 PM

## 2017-11-20 LAB — CULTURE, OB URINE

## 2017-11-20 LAB — GC/CHLAMYDIA PROBE AMP (~~LOC~~) NOT AT ARMC
CHLAMYDIA, DNA PROBE: NEGATIVE
Neisseria Gonorrhea: NEGATIVE

## 2017-12-02 ENCOUNTER — Encounter: Payer: Self-pay | Admitting: Obstetrics

## 2017-12-02 ENCOUNTER — Ambulatory Visit (INDEPENDENT_AMBULATORY_CARE_PROVIDER_SITE_OTHER): Payer: Medicaid Other | Admitting: Obstetrics

## 2017-12-02 VITALS — BP 105/62 | HR 85 | Wt 152.0 lb

## 2017-12-02 DIAGNOSIS — O09299 Supervision of pregnancy with other poor reproductive or obstetric history, unspecified trimester: Secondary | ICD-10-CM

## 2017-12-02 DIAGNOSIS — O0990 Supervision of high risk pregnancy, unspecified, unspecified trimester: Secondary | ICD-10-CM

## 2017-12-02 DIAGNOSIS — K219 Gastro-esophageal reflux disease without esophagitis: Secondary | ICD-10-CM

## 2017-12-02 DIAGNOSIS — O09293 Supervision of pregnancy with other poor reproductive or obstetric history, third trimester: Secondary | ICD-10-CM

## 2017-12-02 DIAGNOSIS — Z3A29 29 weeks gestation of pregnancy: Secondary | ICD-10-CM

## 2017-12-02 DIAGNOSIS — O34219 Maternal care for unspecified type scar from previous cesarean delivery: Secondary | ICD-10-CM

## 2017-12-02 DIAGNOSIS — Z131 Encounter for screening for diabetes mellitus: Secondary | ICD-10-CM

## 2017-12-02 MED ORDER — OMEPRAZOLE 20 MG PO CPDR: 20.0000 mg | DELAYED_RELEASE_CAPSULE | Freq: Two times a day (BID) | ORAL | 5 refills | Status: DC

## 2017-12-02 NOTE — Progress Notes (Addendum)
Pt presents for ROB and Tdap. Pt declines to do 2 gtt labs.

## 2017-12-02 NOTE — Progress Notes (Signed)
Subjective:  Cindy Pearson is a 22 y.o. [phone] at 796w5d being seen today for ongoing prenatal care.  She is currently monitored for the following issues for this high-risk pregnancy and has Late prenatal care affecting pregnancy, antepartum, third trimester; S/P cesarean section; Chlamydia; Supervision of other normal pregnancy, antepartum; and Trichomoniasis on their problem list.  Patient reports heartburn and pelvic pressure.  Contractions: Irregular. Vag. Bleeding: None.  Movement: Present. Denies leaking of fluid.   The following portions of the patient's history were reviewed and updated as appropriate: allergies, current medications, past family history, past medical history, past social history, past surgical history and problem list. Problem list updated.  Objective:   Vitals:   12/02/17 1507  BP: 105/62  Pulse: 85  Weight: 152 lb (68.9 kg)    Fetal Status:     Movement: Present     General:  Alert, oriented and cooperative. Patient is in no acute distress.  Skin: Skin is warm and dry. No rash noted.   Cardiovascular: Normal heart rate noted  Respiratory: Normal respiratory effort, no problems with respiration noted  Abdomen: Soft, gravid, appropriate for gestational age. Pain/Pressure: Present     Pelvic:  Cervical exam deferred        Extremities: Normal range of motion.  Edema: None  Mental Status: Normal mood and affect. Normal behavior. Normal judgment and thought content.   Urinalysis:      Assessment and Plan:  Pregnancy: [id number] at [redacted]w[redacted]d  1. Supervision of high risk pregnancy, antepartum  2. History of IUGR (intrauterine growth retardation) and stillbirth, currently pregnant - ultrasound every 4 weeks for growth  3. Previous cesarean section complicating pregnancy, antepartum condition or complication  4. Screening for diabetes mellitus Rx: - Hemoglobin A1c  5. GERD without esophagitis Rx: - omeprazole (PRILOSEC) 20 MG capsule; Take 1 capsule (20 mg  total) by mouth 2 (two) times daily before a meal.  Dispense: 60 capsule; Refill: 5  Preterm labor symptoms and general obstetric precautions including but not limited to vaginal bleeding, contractions, leaking of fluid and fetal movement were reviewed in detail with the patient. Please refer to After Visit Summary for other counseling recommendations.  Return in about 2 weeks (around 12/16/2017) for ROB.   Brock BadHarper, Timiko Offutt A, MD

## 2017-12-03 LAB — HEMOGLOBIN A1C
Est. average glucose Bld gHb Est-mCnc: 97 mg/dL
HEMOGLOBIN A1C: 5 % (ref 4.8–5.6)

## 2017-12-11 ENCOUNTER — Ambulatory Visit (HOSPITAL_COMMUNITY)
Admission: RE | Admit: 2017-12-11 | Discharge: 2017-12-11 | Disposition: A | Discharge status: Home / Self Care | Payer: Medicaid Other | Source: Ambulatory Visit | Attending: Obstetrics | Admitting: Obstetrics

## 2017-12-11 ENCOUNTER — Encounter (HOSPITAL_COMMUNITY): Payer: Self-pay

## 2017-12-16 ENCOUNTER — Encounter: Payer: Self-pay | Admitting: Obstetrics

## 2017-12-17 ENCOUNTER — Telehealth: Payer: Self-pay | Admitting: Obstetrics

## 2017-12-24 ENCOUNTER — Encounter: Payer: Self-pay | Admitting: Obstetrics

## 2017-12-31 ENCOUNTER — Inpatient Hospital Stay (HOSPITAL_COMMUNITY)
Admission: AD | Admit: 2017-12-31 | Discharge: 2017-12-31 | Disposition: A | Discharge status: Home / Self Care | Payer: Self-pay | Source: Ambulatory Visit | Attending: Obstetrics & Gynecology | Admitting: Obstetrics & Gynecology

## 2017-12-31 ENCOUNTER — Encounter (HOSPITAL_COMMUNITY): Payer: Self-pay

## 2017-12-31 ENCOUNTER — Other Ambulatory Visit: Payer: Self-pay

## 2017-12-31 DIAGNOSIS — Z880 Allergy status to penicillin: Secondary | ICD-10-CM | POA: Insufficient documentation

## 2017-12-31 DIAGNOSIS — Z87891 Personal history of nicotine dependence: Secondary | ICD-10-CM | POA: Insufficient documentation

## 2017-12-31 DIAGNOSIS — O4703 False labor before 37 completed weeks of gestation, third trimester: Secondary | ICD-10-CM | POA: Insufficient documentation

## 2017-12-31 DIAGNOSIS — O219 Vomiting of pregnancy, unspecified: Secondary | ICD-10-CM

## 2017-12-31 DIAGNOSIS — O479 False labor, unspecified: Secondary | ICD-10-CM

## 2017-12-31 DIAGNOSIS — O212 Late vomiting of pregnancy: Secondary | ICD-10-CM | POA: Insufficient documentation

## 2017-12-31 DIAGNOSIS — Z3A33 33 weeks gestation of pregnancy: Secondary | ICD-10-CM

## 2017-12-31 LAB — URINALYSIS, ROUTINE W REFLEX MICROSCOPIC
Bacteria, UA: NONE SEEN
Bilirubin Urine: NEGATIVE
GLUCOSE, UA: NEGATIVE mg/dL
HGB URINE DIPSTICK: NEGATIVE
Ketones, ur: NEGATIVE mg/dL
NITRITE: NEGATIVE
PROTEIN: NEGATIVE mg/dL
SPECIFIC GRAVITY, URINE: 1.025 (ref 1.005–1.030)
pH: 8 (ref 5.0–8.0)

## 2017-12-31 MED ORDER — PROMETHAZINE HCL 25 MG PO TABS
25.0000 mg | ORAL_TABLET | Freq: Once | ORAL | Status: AC
  Administered 2017-12-31: 25 mg via ORAL
  Filled 2017-12-31: qty 1

## 2017-12-31 MED ORDER — PROMETHAZINE HCL 25 MG PO TABS: 12.5000 mg | ORAL_TABLET | Freq: Four times a day (QID) | ORAL | 1 refills | Status: DC | PRN

## 2017-12-31 NOTE — MAU Provider Note (Signed)
History     CSN: [id number]  Arrival date and time: 12/31/17 2139   First Provider Initiated Contact with Patient 12/31/17 2218      Chief Complaint  Patient presents with  . Contractions   HPI Cindy Pearson is a 22 y.o. [phone] at 7424w6d who presents with contractions. She states she gets contractions every day but feels like they were worse today. She does not know how often they happen. She rates them a 3/10 when she has them. She also reports nausea frequently since she ran out of her phenergan. Denies vaginal bleeding or leaking of fluid. Reports normal fetal movement. Patient has not had much prenatal care due to housing and moving frequently between states. She plans to reestablish care with [facility] this week.   OB History    Gravida  5   Para  3   Term  3   Preterm  0   AB  1   Living  3     SAB  1   TAB  0   Ectopic  0   Multiple  0   Live Births  3           Past Medical History:  Diagnosis Date  . Asthma    Albuterol INH prn  . Blood transfusion without reported diagnosis   . Chlamydia 2012   hasn't picked up rx, not treated yet  . 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 Preterm labor 2011    Past Surgical History:  Procedure Laterality Date  . CESAREAN SECTION    . CESAREAN SECTION N/A 05/26/2016   Procedure: CESAREAN SECTION;  Surgeon: Lazaro ArmsEure, Luther H, MD;  [facility];  Service: Obstetrics;  Laterality: N/A;  . TONSILLECTOMY AND ADENOIDECTOMY      Family History  Problem Relation Age of Onset  . Diabetes Maternal Grandmother   . Heart disease Maternal Grandfather   . Cancer Maternal Grandfather   . Heart disease Paternal Grandfather   . Cancer Father     Social History   Tobacco Use  . Smoking status: Former Smoker    Last attempt to quit: 08/20/2017    Years since quitting: 0.3  . Smokeless tobacco: Never Used  Substance Use Topics  . Alcohol use: Not Currently    Comment: occasional  . Drug use: Yes     Types: Marijuana    Comment: last smoked in September 2019    Allergies:  Allergies  Allergen Reactions  . Penicillins Hives    Patient states that she is allergic to all penicillins. Has patient had a PCN reaction causing immediate rash, facial/tongue/throat swelling, SOB or lightheadedness with hypotension: YES Has patient had a PCN reaction causing severe rash involving mucus membranes or skin necrosis: NO Has patient had a PCN reaction that required hospitalization NO Has patient had a PCN reaction occurring within the last 10 years:NO If all of the above answers are "NO", then may proceed with Cephalosporin use.    Medications Prior to Admission  Medication Sig Dispense Refill Last Dose  . acetaminophen (TYLENOL) 500 MG tablet Take 500-1,000 mg by mouth every 6 (six) hours as needed for headache.   Not Taking  . Elastic Bandages & Supports (COMFORT FIT MATERNITY SUPP MED) MISC 1 Device by Does not apply route daily. (Patient not taking: Reported on 12/02/2017) 1 each 0 Not Taking  . omeprazole (PRILOSEC) 20 MG capsule Take 1 capsule (20 mg total) by mouth  2 (two) times daily before a meal. 60 capsule 5   . promethazine (PHENERGAN) 25 MG tablet Take 0.5-1 tablets (12.5-25 mg total) by mouth every 6 (six) hours as needed. (Patient not taking: Reported on 12/02/2017) 30 tablet 0 Not Taking    Review of Systems  Constitutional: Negative.  Negative for fatigue and fever.  HENT: Negative.   Respiratory: Negative.  Negative for shortness of breath.   Cardiovascular: Negative.  Negative for chest pain.  Gastrointestinal: Positive for abdominal pain. Negative for constipation, diarrhea, nausea and vomiting.  Genitourinary: Negative.  Negative for dysuria, vaginal bleeding and vaginal discharge.  Neurological: Negative.  Negative for dizziness and headaches.   Physical Exam   Blood pressure 104/61, pulse 72, temperature 98.3 F (36.8 C), resp. rate 17, height 5\' 1"  (1.549 m), last  menstrual period 05/03/2017, SpO2 100 %, not currently breastfeeding.  Physical Exam  Nursing note and vitals reviewed. Constitutional: She is oriented to person, place, and time. She appears well-developed and well-nourished. No distress.  HENT:  Head: Normocephalic.  Eyes: Pupils are equal, round, and reactive to light.  Cardiovascular: Normal rate, regular rhythm and normal heart sounds.  Respiratory: Effort normal and breath sounds normal. No respiratory distress.  GI: Soft. Bowel sounds are normal. She exhibits no distension. There is no abdominal tenderness. There is no rebound.  Neurological: She is alert and oriented to person, place, and time.  Skin: Skin is warm and dry.  Psychiatric: She has a normal mood and affect. Her behavior is normal. Judgment and thought content normal.    Fetal Tracing:  Baseline: 120 Variability: moderate Accels: 15x15 Decels: none  Toco: none   Cervix: closed/thick/posterior   MAU Course  Procedures Results for orders placed or performed during the hospital encounter of 12/31/17 (from the past 24 hour(s))  Urinalysis, Routine w reflex microscopic     Status: Abnormal   Collection Time: 12/31/17  9:48 PM  Result Value Ref Range   Color, Urine YELLOW YELLOW   APPearance CLOUDY (A) CLEAR   Specific Gravity, Urine 1.025 1.005 - 1.030   pH 8.0 5.0 - 8.0   Glucose, UA NEGATIVE NEGATIVE mg/dL   Hgb urine dipstick NEGATIVE NEGATIVE   Bilirubin Urine NEGATIVE NEGATIVE   Ketones, ur NEGATIVE NEGATIVE mg/dL   Protein, ur NEGATIVE NEGATIVE mg/dL   Nitrite NEGATIVE NEGATIVE   Leukocytes, UA SMALL (A) NEGATIVE   RBC / HPF 0-5 0 - 5 RBC/hpf   WBC, UA 21-50 0 - 5 WBC/hpf   Bacteria, UA NONE SEEN NONE SEEN   Squamous Epithelial / LPF 0-5 0 - 5   Mucus PRESENT    MDM UA Phenergan PO PO hydration  Assessment and Plan   1. Braxton Hick's contraction   2. Nausea and vomiting during pregnancy   3. [redacted] weeks gestation of pregnancy     -Discharge home in stable condition -Rx for phenergan sent to patient's pharmacy -Preterm labor precautions discussed -Patient advised to follow-up with [facility] to restart prenatal care ASAP -Patient may return to MAU as needed or if her condition were to change or worsen   Rolm BookbinderCaroline M Samarra Ridgely CNM 12/31/2017, 10:18 PM

## 2017-12-31 NOTE — MAU Note (Signed)
Presents via EMS for contractions that occur "most every day and when they come they come frequently."  Currently experiencing every 10 minutes.  + FM.  No VB/LOF.  Last prenatal appointment end of November-[place] so she's planning on calling to reschedule tomorrow.  Hasn't taken anything for the pain.

## 2017-12-31 NOTE — Discharge Instructions (Signed)

## 2018-01-01 ENCOUNTER — Inpatient Hospital Stay (HOSPITAL_COMMUNITY)
Admission: AD | Admit: 2018-01-01 | Discharge: 2018-01-01 | Disposition: A | Discharge status: Home / Self Care | Payer: Self-pay | Attending: Obstetrics & Gynecology | Admitting: Obstetrics & Gynecology

## 2018-01-01 ENCOUNTER — Encounter (HOSPITAL_COMMUNITY): Payer: Self-pay

## 2018-01-01 DIAGNOSIS — O26893 Other specified pregnancy related conditions, third trimester: Secondary | ICD-10-CM | POA: Insufficient documentation

## 2018-01-01 DIAGNOSIS — R109 Unspecified abdominal pain: Secondary | ICD-10-CM | POA: Insufficient documentation

## 2018-01-01 DIAGNOSIS — Z3A34 34 weeks gestation of pregnancy: Secondary | ICD-10-CM | POA: Insufficient documentation

## 2018-01-01 DIAGNOSIS — Z880 Allergy status to penicillin: Secondary | ICD-10-CM | POA: Insufficient documentation

## 2018-01-01 DIAGNOSIS — Z87891 Personal history of nicotine dependence: Secondary | ICD-10-CM | POA: Insufficient documentation

## 2018-01-01 DIAGNOSIS — O26899 Other specified pregnancy related conditions, unspecified trimester: Secondary | ICD-10-CM

## 2018-01-01 MED ORDER — LACTATED RINGERS IV BOLUS
1000.0000 mL | Freq: Once | INTRAVENOUS | Status: AC
  Administered 2018-01-01: 1000 mL via INTRAVENOUS

## 2018-01-01 NOTE — MAU Provider Note (Signed)
History     CSN: [id number]  Arrival date and time: 01/01/18 [id number]   First Provider Initiated Contact with Patient 01/01/18 [id number]      Chief Complaint  Patient presents with  . Abdominal Pain   HPI Cindy Pearson is a 22 y.o. [id number] at 7139w0d who presents via EMS again for abdominal pain. She states after she left MAU 3 hours ago, she started having cramping. She states "it was contractions but now it's cramping." She rates them a 4/10 and has not tried anything for the pain. Denies leaking or bleeding. Reports normal fetal movement.   OB History    Gravida  5   Para  3   Term  3   Preterm  0   AB  1   Living  3     SAB  1   TAB  0   Ectopic  0   Multiple  0   Live Births  3           Past Medical History:  Diagnosis Date  . Asthma    Albuterol INH prn  . Blood transfusion without reported diagnosis   . Chlamydia 2012   hasn't picked up rx, not treated yet  . 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 Preterm labor 2011    Past Surgical History:  Procedure Laterality Date  . CESAREAN SECTION    . CESAREAN SECTION N/A 05/26/2016   Procedure: CESAREAN SECTION;  Surgeon: Lazaro ArmsEure, Luther H, MD;  [facility];  Service: Obstetrics;  Laterality: N/A;  . TONSILLECTOMY AND ADENOIDECTOMY      Family History  Problem Relation Age of Onset  . Diabetes Maternal Grandmother   . Heart disease Maternal Grandfather   . Cancer Maternal Grandfather   . Heart disease Paternal Grandfather   . Cancer Father     Social History   Tobacco Use  . Smoking status: Former Smoker    Last attempt to quit: 08/20/2017    Years since quitting: 0.3  . Smokeless tobacco: Never Used  Substance Use Topics  . Alcohol use: Not Currently    Comment: occasional  . Drug use: Yes    Types: Marijuana    Comment: last smoked in September 2019    Allergies:  Allergies  Allergen Reactions  . Penicillins Hives    Patient states that she is allergic to all  penicillins. Has patient had a PCN reaction causing immediate rash, facial/tongue/throat swelling, SOB or lightheadedness with hypotension: YES Has patient had a PCN reaction causing severe rash involving mucus membranes or skin necrosis: NO Has patient had a PCN reaction that required hospitalization NO Has patient had a PCN reaction occurring within the last 10 years:NO If all of the above answers are "NO", then may proceed with Cephalosporin use.    Medications Prior to Admission  Medication Sig Dispense Refill Last Dose  . acetaminophen (TYLENOL) 500 MG tablet Take 500-1,000 mg by mouth every 6 (six) hours as needed for headache.   Not Taking  . Elastic Bandages & Supports (COMFORT FIT MATERNITY SUPP MED) MISC 1 Device by Does not apply route daily. (Patient not taking: Reported on 12/02/2017) 1 each 0 Not Taking  . omeprazole (PRILOSEC) 20 MG capsule Take 1 capsule (20 mg total) by mouth 2 (two) times daily before a meal. 60 capsule 5   . promethazine (PHENERGAN) 25 MG tablet Take 0.5-1 tablets (12.5-25 mg total) by mouth every 6 (six) hours  as needed. 30 tablet 1     Review of Systems  Constitutional: Negative.  Negative for fatigue and fever.  HENT: Negative.   Respiratory: Negative.  Negative for shortness of breath.   Cardiovascular: Negative.  Negative for chest pain.  Gastrointestinal: Positive for abdominal pain. Negative for constipation, diarrhea, nausea and vomiting.  Genitourinary: Negative.  Negative for dysuria, vaginal bleeding and vaginal discharge.  Neurological: Negative.  Negative for dizziness and headaches.   Physical Exam   Blood pressure (!) 106/56, pulse 70, temperature 97.8 F (36.6 C), resp. rate 15, last menstrual period 05/03/2017, not currently breastfeeding.  Physical Exam  Nursing note and vitals reviewed. Constitutional: She is oriented to person, place, and time. She appears well-developed and well-nourished. No distress.  HENT:  Head:  Normocephalic.  Eyes: Pupils are equal, round, and reactive to light.  Cardiovascular: Normal rate, regular rhythm and normal heart sounds.  Respiratory: Effort normal and breath sounds normal. No respiratory distress.  GI: Soft. Bowel sounds are normal. She exhibits no distension. There is no abdominal tenderness.  Neurological: She is alert and oriented to person, place, and time.  Skin: Skin is warm and dry.  Psychiatric: She has a normal mood and affect. Her behavior is normal. Judgment and thought content normal.   Fetal Tracing:  Baseline: 120 Variability: moderate Accels: 15x15 Decels: none   Toco: none  Dilation: Closed Effacement (%): Thick Cervical Position: Posterior Exam by:: Cleone Slimaroline Tiffiney Sparrow, CNM   MAU Course  Procedures  MDM Patient resting comfortably in bed, texting and talking on the phone Offered patient IV fluids for comfort- patient agreeable to plan of care  Patient homeless and has no where to go. States she came back to the hospital because she was cold. List of resources given to patient and patient called taxi at discharge.   Assessment and Plan   1. Abdominal cramping affecting pregnancy   2. [redacted] weeks gestation of pregnancy    -Discharge home in stable condition -Preterm labor precautions discussed -Patient advised to follow-up with [facility] for prenatal care -Patient may return to MAU as needed or if her condition were to change or worsen   Rolm BookbinderCaroline M Moris Ratchford CNM 01/01/2018, 2:37 AM

## 2018-01-01 NOTE — MAU Note (Signed)
Presents via EMS again for abdominal cramping-states the cramping started right when she left here the first time and hasn't stopped.  No LOF/VB.  + FM.

## 2018-01-01 NOTE — Discharge Instructions (Signed)

## 2018-01-12 ENCOUNTER — Ambulatory Visit (HOSPITAL_COMMUNITY)
Admission: RE | Admit: 2018-01-12 | Discharge: 2018-01-12 | Disposition: A | Discharge status: Home / Self Care | Payer: Medicaid Other | Source: Ambulatory Visit | Attending: Obstetrics | Admitting: Obstetrics

## 2018-01-12 ENCOUNTER — Encounter (HOSPITAL_COMMUNITY): Payer: Self-pay

## 2018-01-14 ENCOUNTER — Encounter: Payer: Self-pay | Admitting: Obstetrics

## 2018-01-14 ENCOUNTER — Other Ambulatory Visit: Payer: Self-pay

## 2018-01-14 ENCOUNTER — Ambulatory Visit (INDEPENDENT_AMBULATORY_CARE_PROVIDER_SITE_OTHER): Payer: Self-pay | Admitting: Obstetrics

## 2018-01-14 VITALS — BP 113/63 | HR 86 | Wt 157.0 lb

## 2018-01-14 DIAGNOSIS — O0990 Supervision of high risk pregnancy, unspecified, unspecified trimester: Secondary | ICD-10-CM

## 2018-01-14 DIAGNOSIS — O0993 Supervision of high risk pregnancy, unspecified, third trimester: Secondary | ICD-10-CM

## 2018-01-14 DIAGNOSIS — Z3A35 35 weeks gestation of pregnancy: Secondary | ICD-10-CM

## 2018-01-14 DIAGNOSIS — O34219 Maternal care for unspecified type scar from previous cesarean delivery: Secondary | ICD-10-CM

## 2018-01-14 DIAGNOSIS — O09293 Supervision of pregnancy with other poor reproductive or obstetric history, third trimester: Secondary | ICD-10-CM

## 2018-01-14 NOTE — Progress Notes (Addendum)
Subjective:  Cindy Pearson is a 23 y.o. [phone] at [redacted]w[redacted]d being seen today for ongoing prenatal care.  She is currently monitored for the following issues for this high-risk pregnancy and has Late prenatal care affecting pregnancy, antepartum, third trimester; S/P cesarean section; Chlamydia; Supervision of other normal pregnancy, antepartum; and Trichomoniasis on their problem list.  Patient reports pelvic pressure.  Contractions: Irregular. Vag. Bleeding: None.  Movement: Present. Denies leaking of fluid.   The following portions of the patient's history were reviewed and updated as appropriate: allergies, current medications, past family history, past medical history, past social history, past surgical history and problem list. Problem list updated.  Objective:   Vitals:   01/14/18 0829  BP: 113/63  Pulse: 86  Weight: 157 lb (71.2 kg)    Fetal Status:     Movement: Present     General:  Alert, oriented and cooperative. Patient is in no acute distress.  Skin: Skin is warm and dry. No rash noted.   Cardiovascular: Normal heart rate noted  Respiratory: Normal respiratory effort, no problems with respiration noted  Abdomen: Soft, gravid, appropriate for gestational age. Pain/Pressure: Present     Pelvic:  Cervical exam deferred        Extremities: Normal range of motion.  Edema: None  Mental Status: Normal mood and affect. Normal behavior. Normal judgment and thought content.   Urinalysis:      Assessment and Plan:  Pregnancy: [id number] at [redacted]w[redacted]d  1. Supervision of high risk pregnancy, antepartum - limited prenatal care with this pregnancy  2. Previous cesarean section x 2 complicating pregnancy, antepartum condition or complication - for repeat C/S at 39 weeks  3. Prior pregnancy complicated by IUGR, antepartum, third trimester Rx: - US OB Follow Up for Interval Growth; Future  Preterm labor symptoms and general obstetric precautions including but not limited to vaginal  bleeding, contractions, leaking of fluid and fetal movement were reviewed in detail with the patient. Please refer to After Visit Summary for other counseling recommendations.  Return in about 1 week (around 01/21/2018) for ROB.   Brock Bad, MD

## 2018-01-14 NOTE — Progress Notes (Signed)
ROB.  Reports no problems today.   Declined FLU Vaccine.

## 2018-01-15 ENCOUNTER — Other Ambulatory Visit: Payer: Self-pay

## 2018-01-15 ENCOUNTER — Telehealth: Payer: Self-pay | Admitting: *Deleted

## 2018-01-15 ENCOUNTER — Encounter (HOSPITAL_COMMUNITY): Payer: Self-pay

## 2018-01-15 ENCOUNTER — Inpatient Hospital Stay (HOSPITAL_COMMUNITY)
Admission: AD | Admit: 2018-01-15 | Discharge: 2018-01-15 | Disposition: A | Discharge status: Home / Self Care | Payer: Medicaid Other | Source: Ambulatory Visit | Attending: Family Medicine | Admitting: Family Medicine

## 2018-01-15 DIAGNOSIS — O4703 False labor before 37 completed weeks of gestation, third trimester: Secondary | ICD-10-CM | POA: Insufficient documentation

## 2018-01-15 DIAGNOSIS — O471 False labor at or after 37 completed weeks of gestation: Secondary | ICD-10-CM

## 2018-01-15 DIAGNOSIS — Z3A36 36 weeks gestation of pregnancy: Secondary | ICD-10-CM | POA: Insufficient documentation

## 2018-01-15 NOTE — Discharge Instructions (Signed)
Braxton Hicks Contractions Contractions of the uterus can occur throughout pregnancy, but they are not always a sign that you are in labor. You may have practice contractions called Braxton Hicks contractions. These false labor contractions are sometimes confused with true labor. What are Braxton Hicks contractions? Braxton Hicks contractions are tightening movements that occur in the muscles of the uterus before labor. Unlike true labor contractions, these contractions do not result in opening (dilation) and thinning of the cervix. Toward the end of pregnancy (32-34 weeks), Braxton Hicks contractions can happen more often and may become stronger. These contractions are sometimes difficult to tell apart from true labor because they can be very uncomfortable. You should not feel embarrassed if you go to the hospital with false labor. Sometimes, the only way to tell if you are in true labor is for your health care provider to look for changes in the cervix. The health care provider will do a physical exam and may monitor your contractions. If you are not in true labor, the exam should show that your cervix is not dilating and your water has not broken. If there are no other health problems associated with your pregnancy, it is completely safe for you to be sent home with false labor. You may continue to have Braxton Hicks contractions until you go into true labor. How to tell the difference between true labor and false labor True labor  Contractions last 30-70 seconds.  Contractions become very regular.  Discomfort is usually felt in the top of the uterus, and it spreads to the lower abdomen and low back.  Contractions do not go away with walking.  Contractions usually become more intense and increase in frequency.  The cervix dilates and gets thinner. False labor  Contractions are usually shorter and not as strong as true labor contractions.  Contractions are usually irregular.  Contractions  are often felt in the front of the lower abdomen and in the groin.  Contractions may go away when you walk around or change positions while lying down.  Contractions get weaker and are shorter-lasting as time goes on.  The cervix usually does not dilate or become thin. Follow these instructions at home:   Take over-the-counter and prescription medicines only as told by your health care provider.  Keep up with your usual exercises and follow other instructions from your health care provider.  Eat and drink lightly if you think you are going into labor.  If Braxton Hicks contractions are making you uncomfortable: ? Change your position from lying down or resting to walking, or change from walking to resting. ? Sit and rest in a tub of warm water. ? Drink enough fluid to keep your urine pale yellow. Dehydration may cause these contractions. ? Do slow and deep breathing several times an hour.  Keep all follow-up prenatal visits as told by your health care provider. This is important. Contact a health care provider if:  You have a fever.  You have continuous pain in your abdomen. Get help right away if:  Your contractions become stronger, more regular, and closer together.  You have fluid leaking or gushing from your vagina.  You pass blood-tinged mucus (bloody show).  You have bleeding from your vagina.  You have low back pain that you never had before.  You feel your baby's head pushing down and causing pelvic pressure.  Your baby is not moving inside you as much as it used to. Summary  Contractions that occur before labor are   called Braxton Hicks contractions, false labor, or practice contractions.  Braxton Hicks contractions are usually shorter, weaker, farther apart, and less regular than true labor contractions. True labor contractions usually become progressively stronger and regular, and they become more frequent.  Manage discomfort from Braxton Hicks contractions  by changing position, resting in a warm bath, drinking plenty of water, or practicing deep breathing. This information is not intended to replace advice given to you by your health care provider. Make sure you discuss any questions you have with your health care provider. Document Released: 05/09/2016 Document Revised: 10/08/2016 Document Reviewed: 05/09/2016 Elsevier Interactive Patient Education  2019 Elsevier Inc.  

## 2018-01-15 NOTE — Telephone Encounter (Signed)
Pt called to office, LM stating that she has been ctx since last night-some really painful/ possibly lost mucous plug/ blood tinge in mucous d/c and want to know what to do.  Attempt to return call. No answer, LM on VM making pt aware [facility] still consistent and painful. Advised to call office if needed.

## 2018-01-15 NOTE — MAU Note (Signed)
Pt reports ctx's every 5 minutes today that have got further apart.   Reports possibly losing her mucous plug.  Denies LOF, Denies vaginal bleeding.   Reports feeling fetal movement

## 2018-01-15 NOTE — MAU Provider Note (Signed)
  S: Ms. Cindy Pearson is a 23 y.o. [phone] at [redacted]w[redacted]d  who presents to MAU today complaining contractions q 5 minutes since this morning that have since spaced out. She denies vaginal bleeding. She denies LOF. She reports normal fetal movement.    O: BP 100/61 (BP Location: Left Arm)   Pulse 73   Temp 98.7 F (37.1 C)   Resp 12   LMP 05/03/2017 (Approximate)   SpO2 99%  GENERAL: Well-developed, well-nourished female in no acute distress.  HEAD: Normocephalic, atraumatic.  CHEST: Normal effort of breathing, regular heart rate ABDOMEN: Soft, nontender, gravid  Cervical exam:  Dilation: 1 Effacement (%): Thick Station: Ballotable Exam by:: Ginger Morris, RN    Fetal Monitoring: Baseline: 125 Variability: moderate Accelerations: 15x15 Decelerations: none Contractions: 1 uc with ui   A: SIUP at [redacted]w[redacted]d  False labor  P: Discharge home Labor precautions reviewed Patient to follow up at [facility] as scheduled for prenatal care  Rolm Bookbinder, CNM 01/15/2018 6:22 PM

## 2018-01-19 ENCOUNTER — Ambulatory Visit (HOSPITAL_COMMUNITY)
Admission: RE | Admit: 2018-01-19 | Discharge: 2018-01-19 | Disposition: A | Discharge status: Home / Self Care | Payer: Medicaid Other | Source: Ambulatory Visit | Attending: Obstetrics | Admitting: Obstetrics

## 2018-01-19 DIAGNOSIS — Z362 Encounter for other antenatal screening follow-up: Secondary | ICD-10-CM | POA: Diagnosis not present

## 2018-01-19 DIAGNOSIS — O09299 Supervision of pregnancy with other poor reproductive or obstetric history, unspecified trimester: Secondary | ICD-10-CM | POA: Insufficient documentation

## 2018-01-19 DIAGNOSIS — O34219 Maternal care for unspecified type scar from previous cesarean delivery: Secondary | ICD-10-CM | POA: Diagnosis not present

## 2018-01-19 DIAGNOSIS — Z3A36 36 weeks gestation of pregnancy: Secondary | ICD-10-CM | POA: Diagnosis not present

## 2018-01-19 DIAGNOSIS — O09293 Supervision of pregnancy with other poor reproductive or obstetric history, third trimester: Secondary | ICD-10-CM | POA: Diagnosis not present

## 2018-01-21 ENCOUNTER — Telehealth (HOSPITAL_COMMUNITY): Payer: Self-pay | Admitting: *Deleted

## 2018-01-21 ENCOUNTER — Encounter: Payer: Self-pay | Admitting: Obstetrics

## 2018-01-21 ENCOUNTER — Ambulatory Visit (INDEPENDENT_AMBULATORY_CARE_PROVIDER_SITE_OTHER): Payer: Medicaid Other | Admitting: Obstetrics

## 2018-01-21 ENCOUNTER — Other Ambulatory Visit (HOSPITAL_COMMUNITY)
Admission: RE | Admit: 2018-01-21 | Discharge: 2018-01-21 | Disposition: A | Discharge status: Home / Self Care | Payer: Medicaid Other | Source: Ambulatory Visit | Attending: Obstetrics | Admitting: Obstetrics

## 2018-01-21 VITALS — BP 106/66 | HR 83 | Wt 158.0 lb

## 2018-01-21 DIAGNOSIS — Z3480 Encounter for supervision of other normal pregnancy, unspecified trimester: Secondary | ICD-10-CM | POA: Insufficient documentation

## 2018-01-21 NOTE — Progress Notes (Signed)
Pt presents for ROB without complaints today. GBS/GC/CT due today.

## 2018-01-21 NOTE — Telephone Encounter (Signed)
Preadmission screen  

## 2018-01-21 NOTE — Progress Notes (Signed)
Subjective:  Cindy Pearson is a 23 y.o. [phone] at [redacted]w[redacted]d being seen today for ongoing prenatal care.  She is currently monitored for the following issues for this low-risk pregnancy and has Late prenatal care affecting pregnancy, antepartum, third trimester; S/P cesarean section; Chlamydia; Supervision of other normal pregnancy, antepartum; and Trichomoniasis on their problem list.  Patient reports no complaints.  Contractions: Irregular. Vag. Bleeding: None.  Movement: Present. Denies leaking of fluid.   The following portions of the patient's history were reviewed and updated as appropriate: allergies, current medications, past family history, past medical history, past social history, past surgical history and problem list. Problem list updated.  Objective:   Vitals:   01/21/18 1050  BP: 106/66  Pulse: 83  Weight: 158 lb (71.7 kg)    Fetal Status:     Movement: Present     General:  Alert, oriented and cooperative. Patient is in no acute distress.  Skin: Skin is warm and dry. No rash noted.   Cardiovascular: Normal heart rate noted  Respiratory: Normal respiratory effort, no problems with respiration noted  Abdomen: Soft, gravid, appropriate for gestational age. Pain/Pressure: Present     Pelvic:  Cervical exam deferred        Extremities: Normal range of motion.  Edema: Trace  Mental Status: Normal mood and affect. Normal behavior. Normal judgment and thought content.   Urinalysis:      Assessment and Plan:  Pregnancy: [id number] at [redacted]w[redacted]d  1. Supervision of other normal pregnancy, antepartum Rx: - Culture, beta strep (group b only) - Cervicovaginal ancillary only( [place])  Preterm labor symptoms and general obstetric precautions including but not limited to vaginal bleeding, contractions, leaking of fluid and fetal movement were reviewed in detail with the patient. Please refer to After Visit Summary for other counseling recommendations.  Return in about 1 week (around  01/28/2018) for ROB.   Brock Bad, MD

## 2018-01-22 ENCOUNTER — Telehealth (HOSPITAL_COMMUNITY): Payer: Self-pay | Admitting: *Deleted

## 2018-01-22 LAB — CERVICOVAGINAL ANCILLARY ONLY
BACTERIAL VAGINITIS: POSITIVE — AB
Candida vaginitis: NEGATIVE
Chlamydia: POSITIVE — AB
Neisseria Gonorrhea: NEGATIVE
TRICH (WINDOWPATH): NEGATIVE

## 2018-01-22 NOTE — Telephone Encounter (Signed)
Preadmission screen  

## 2018-01-24 ENCOUNTER — Encounter (HOSPITAL_COMMUNITY)
Admission: AD | Disposition: A | Discharge status: Home / Self Care | Payer: Self-pay | Source: Ambulatory Visit | Attending: Family Medicine

## 2018-01-24 ENCOUNTER — Encounter (HOSPITAL_COMMUNITY): Payer: Self-pay | Admitting: Emergency Medicine

## 2018-01-24 ENCOUNTER — Encounter (HOSPITAL_COMMUNITY): Payer: Self-pay

## 2018-01-24 ENCOUNTER — Inpatient Hospital Stay (HOSPITAL_COMMUNITY)
Admission: AD | Admit: 2018-01-24 | Discharge: 2018-01-24 | Disposition: A | Discharge status: Home / Self Care | Payer: Medicaid Other | Source: Ambulatory Visit | Attending: Family Medicine | Admitting: Family Medicine

## 2018-01-24 DIAGNOSIS — Z880 Allergy status to penicillin: Secondary | ICD-10-CM | POA: Insufficient documentation

## 2018-01-24 DIAGNOSIS — O4693 Antepartum hemorrhage, unspecified, third trimester: Secondary | ICD-10-CM | POA: Insufficient documentation

## 2018-01-24 DIAGNOSIS — Z87891 Personal history of nicotine dependence: Secondary | ICD-10-CM | POA: Insufficient documentation

## 2018-01-24 DIAGNOSIS — N898 Other specified noninflammatory disorders of vagina: Secondary | ICD-10-CM

## 2018-01-24 DIAGNOSIS — O26853 Spotting complicating pregnancy, third trimester: Secondary | ICD-10-CM

## 2018-01-24 DIAGNOSIS — Z3A37 37 weeks gestation of pregnancy: Secondary | ICD-10-CM

## 2018-01-24 DIAGNOSIS — O26893 Other specified pregnancy related conditions, third trimester: Secondary | ICD-10-CM

## 2018-01-24 DIAGNOSIS — R1030 Lower abdominal pain, unspecified: Secondary | ICD-10-CM | POA: Insufficient documentation

## 2018-01-24 LAB — URINALYSIS, ROUTINE W REFLEX MICROSCOPIC
Bacteria, UA: NONE SEEN
Bilirubin Urine: NEGATIVE
Glucose, UA: NEGATIVE mg/dL
Hgb urine dipstick: NEGATIVE
Ketones, ur: NEGATIVE mg/dL
Nitrite: NEGATIVE
Protein, ur: NEGATIVE mg/dL
Specific Gravity, Urine: 1.004 — ABNORMAL LOW (ref 1.005–1.030)
pH: 7 (ref 5.0–8.0)

## 2018-01-24 LAB — AMNISURE RUPTURE OF MEMBRANE (ROM) NOT AT ARMC: Amnisure ROM: NEGATIVE

## 2018-01-24 SURGERY — Surgical Case
Anesthesia: Regional

## 2018-01-24 NOTE — MAU Note (Signed)
Went to [employer] [facility] and about 30 mins ago did not feel well. Went to [facility] and throw up. As was sitting on toilet could feel fld come out everytime I threw up. Saw some blood in fld and some pink on tissue when I wiped. Good FM. For repeat c/s 02/05/2018

## 2018-01-24 NOTE — Discharge Instructions (Signed)
Vaginal Bleeding During Pregnancy, Third Trimester ° °A small amount of bleeding (spotting) from the vagina is common during pregnancy. Sometimes the bleeding is normal and is not a problem, and sometimes it is a sign of something serious. Tell your doctor about any bleeding from your vagina right away. °Follow these instructions at home: °Activity °· Follow your doctor's instructions about how active you can be. Your doctor may recommend that you: °? Stay in bed and only get up to use the bathroom. °? Continue light activity. °· If needed, make plans for someone to help you with your normal activities. °· Ask your doctor if it is safe for you to drive. °· Do not lift anything that is heavier than 10 lb (4.5 kg) until your doctor says that this is safe. °· Do not have sex or orgasms until your doctor says that this is safe. °Medicines °· Take over-the-counter and prescription medicines only as told by your doctor. °· Do not take aspirin. It can cause bleeding. °General instructions °· Watch your condition for any changes. °· Write down: °? The number of pads you use each day. °? How often you change pads. °? How soaked (saturated) your pads are. °· Do not use tampons. °· Do not douche. °· If you pass any tissue from your vagina, save the tissue to show your doctor. °· Keep all follow-up visits as told by your doctor. This is important. °Contact a doctor if: °· You have vaginal bleeding at any time during pregnancy. °· You have cramps. °· You have a fever. °Get help right away if: °· You have very bad cramps. °· You have very bad pain in your back or belly (abdomen). °· You have a gush of fluid from your vagina. °· You pass large clots or a lot of tissue from your vagina. °· Your bleeding gets worse. °· You feel light-headed or weak. °· You pass out (faint). °· Your baby is moving less than usual, or not moving at all. °Summary °· Tell your doctor about any bleeding from your vagina right away. °· Follow instructions  from your doctor about how active you can be. You may need someone to help you with your normal activities. °This information is not intended to replace advice given to you by your health care provider. Make sure you discuss any questions you have with your health care provider. °Document Released: 05/10/2013 Document Revised: 03/27/2016 Document Reviewed: 03/27/2016 °Elsevier Interactive Patient Education © 2019 [employer]vier Inc. ° °

## 2018-01-24 NOTE — MAU Provider Note (Signed)
History    CSN: [id number] Arrival date and time: 01/24/18 0343  None    Chief Complaint  Patient presents with  . Contractions  . Vaginal Bleeding   HPI 23yo [id number] at 4066w2d who presents with lower abdominal cramping and concern for ROM. Patient went to [employer] this morning to walk around. Started to feel poorly and went to bathroom and threw up. Sat on toilet and threw up on floor because states she usually pees when she throws up. While throwing up, thought she felt a pop and noticed her underwear were damp. Has not noticed continued leaking but has noticed underwear continuing to feel damp. Feels normal FM. Not necessarily having contractions but does state she is feeling cramping. States pain has gotten better since arriving here. States some blood-tinge to fluid/discharge in underwear.   OB History    Gravida  5   Para  3   Term  3   Preterm  0   AB  1   Living  3     SAB  1   TAB  0   Ectopic  0   Multiple  0   Live Births  3           Past Medical History:  Diagnosis Date  . Asthma    Albuterol INH prn  . Blood transfusion without reported diagnosis   . Chlamydia 2012   hasn't picked up rx, not treated yet  . 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 Preterm labor 2011    Past Surgical History:  Procedure Laterality Date  . CESAREAN SECTION    . CESAREAN SECTION N/A 05/26/2016   Procedure: CESAREAN SECTION;  Surgeon: Lazaro ArmsEure, Luther H, MD;  [facility];  Service: Obstetrics;  Laterality: N/A;  . TONSILLECTOMY AND ADENOIDECTOMY      Family History  Problem Relation Age of Onset  . Diabetes Maternal Grandmother   . Heart disease Maternal Grandfather   . Cancer Maternal Grandfather   . Heart disease Paternal Grandfather   . Cancer Father     Social History   Tobacco Use  . Smoking status: Former Smoker    Last attempt to quit: 08/20/2017    Years since quitting: 0.4  . Smokeless tobacco: Never Used  Substance Use Topics   . Alcohol use: Not Currently    Comment: occasional  . Drug use: Yes    Types: Marijuana    Comment: last smoked in September 2019    Allergies:  Allergies  Allergen Reactions  . Penicillins Hives    Patient states that she is allergic to all penicillins. Has patient had a PCN reaction causing immediate rash, facial/tongue/throat swelling, SOB or lightheadedness with hypotension: YES Has patient had a PCN reaction causing severe rash involving mucus membranes or skin necrosis: NO Has patient had a PCN reaction that required hospitalization NO Has patient had a PCN reaction occurring within the last 10 years:NO If all of the above answers are "NO", then may proceed with Cephalosporin use.    Medications Prior to Admission  Medication Sig Dispense Refill Last Dose  . acetaminophen (TYLENOL) 500 MG tablet Take 500-1,000 mg by mouth every 6 (six) hours as needed for headache.   Not Taking  . Elastic Bandages & Supports (COMFORT FIT MATERNITY SUPP MED) MISC 1 Device by Does not apply route daily. (Patient not taking: Reported on 12/02/2017) 1 each 0 Not Taking  . omeprazole (PRILOSEC) 20 MG capsule Take  1 capsule (20 mg total) by mouth 2 (two) times daily before a meal. (Patient not taking: Reported on 01/21/2018) 60 capsule 5 Not Taking  . promethazine (PHENERGAN) 25 MG tablet Take 0.5-1 tablets (12.5-25 mg total) by mouth every 6 (six) hours as needed. (Patient not taking: Reported on 01/21/2018) 30 tablet 1 Not Taking    Review of Systems  Constitutional: Positive for fatigue. Negative for activity change.  Eyes: Negative for visual disturbance.  Respiratory: Negative for shortness of breath.   Cardiovascular: Negative for chest pain and leg swelling.  Gastrointestinal: Positive for abdominal pain, nausea and vomiting.  Endocrine: Negative for polyuria.  Genitourinary: Negative for dysuria, pelvic pain, vaginal bleeding and vaginal discharge.  Musculoskeletal: Negative for back pain.   Neurological: Negative for dizziness, light-headedness and headaches.  Psychiatric/Behavioral: Positive for sleep disturbance. The patient is not nervous/anxious.    Physical Exam   Blood pressure (!) 106/58, pulse 78, temperature (!) 97.4 F (36.3 C), resp. rate 18, height 5\' 1"  (1.549 m), weight 72.1 kg, last menstrual period 05/03/2017, not currently breastfeeding.  Physical Exam  Nursing note and vitals reviewed. Constitutional: She is oriented to person, place, and time. She appears well-developed and well-nourished.  Appears tired, uncomfortable  HENT:  Head: Normocephalic and atraumatic.  Eyes: Conjunctivae and EOM are normal. No scleral icterus.  Cardiovascular: Normal rate.  Respiratory: No respiratory distress.  GI: Soft.  gravid  Genitourinary:    Genitourinary Comments: Normal appearing labia  normal appearing vaginal mucosa  moderate amount of thin yellow-clear discharge in vault, does not increase with valsalva, cervix appears visually dilated  SVE 1/thick/ballotable    Musculoskeletal:        General: No edema.  Neurological: She is alert and oriented to person, place, and time.  Skin: Skin is warm and dry.  Psychiatric: She has a normal mood and affect. Her behavior is normal.    MAU Course  Procedures  MDM -- Reactive NST: 120s/mod/+a/-d; uterine irritability  -- equivocal pooling, negative ferning x 3, will check Amnisure  -- Amnisure negative   Assessment and Plan  23yo [id number] at 107w2d who presents with concern for ROM, blood tinged discharge and uterine cramping. No ferning, equivocal pooling and negative Amnisure. Cervix 1/thick/posterior and no regular, painful contractions. No concerning vaginal bleeding seen on exam. No signs of ROM, not in active labor, stable for discharge home. Term labor precautions reviewed. Has routine prenatal appointment next week.   Tamera Stands, DO 01/24/2018, 5:12 AM

## 2018-01-25 LAB — CULTURE, BETA STREP (GROUP B ONLY): STREP GP B CULTURE: NEGATIVE

## 2018-01-26 ENCOUNTER — Telehealth (HOSPITAL_COMMUNITY): Payer: Self-pay | Admitting: *Deleted

## 2018-01-26 NOTE — Telephone Encounter (Signed)
Preadmission screen  

## 2018-01-27 ENCOUNTER — Telehealth (HOSPITAL_COMMUNITY): Payer: Self-pay | Admitting: *Deleted

## 2018-01-27 NOTE — Telephone Encounter (Signed)
Preadmission screen  

## 2018-01-28 ENCOUNTER — Telehealth (HOSPITAL_COMMUNITY): Payer: Self-pay | Admitting: *Deleted

## 2018-01-28 NOTE — Telephone Encounter (Signed)
Preadmission screen  

## 2018-01-29 ENCOUNTER — Ambulatory Visit (INDEPENDENT_AMBULATORY_CARE_PROVIDER_SITE_OTHER): Payer: Medicaid Other | Admitting: Obstetrics & Gynecology

## 2018-01-29 ENCOUNTER — Telehealth (HOSPITAL_COMMUNITY): Payer: Self-pay | Admitting: *Deleted

## 2018-01-29 VITALS — BP 104/68 | HR 71 | Wt 159.5 lb

## 2018-01-29 DIAGNOSIS — Z3483 Encounter for supervision of other normal pregnancy, third trimester: Secondary | ICD-10-CM

## 2018-01-29 DIAGNOSIS — Z98891 History of uterine scar from previous surgery: Secondary | ICD-10-CM

## 2018-01-29 DIAGNOSIS — Z3480 Encounter for supervision of other normal pregnancy, unspecified trimester: Secondary | ICD-10-CM

## 2018-01-29 NOTE — Telephone Encounter (Signed)
Preadmission screen  

## 2018-01-29 NOTE — Patient Instructions (Signed)
Cesarean Delivery  Cesarean birth, or cesarean delivery, is the surgical delivery of a baby through an incision in the abdomen and the uterus. This may be referred to as a C-section. This procedure may be scheduled ahead of time, or it may be done in an emergency situation.  Tell a health care provider about:   Any allergies you have.   All medicines you are taking, including vitamins, herbs, eye drops, creams, and over-the-counter medicines.   Any problems you or family members have had with anesthetic medicines.   Any blood disorders you have.   Any surgeries you have had.   Any medical conditions you have.   Whether you or any members of your family have a history of deep vein thrombosis (DVT) or pulmonary embolism (PE).  What are the risks?  Generally, this is a safe procedure. However, problems may occur, including:   Infection.   Bleeding.   Allergic reactions to medicines.   Damage to other structures or organs.   Blood clots.   Injury to your baby.  What happens before the procedure?  General instructions   Follow instructions from your health care provider about eating or drinking restrictions.   If you know that you are going to have a cesarean delivery, do not shave your pubic area. Shaving before the procedure may increase your risk of infection.   Plan to have someone take you home from the hospital.   Ask your health care provider what steps will be taken to prevent infection. These may include:  ? Removing hair at the surgery site.  ? Washing skin with a germ-killing soap.  ? Taking antibiotic medicine.   Depending on the reason for your cesarean delivery, you may have a physical exam or additional testing, such as an ultrasound.   You may have your blood or urine tested.  Questions for your health care provider   Ask your health care provider about:  ? Changing or stopping your regular medicines. This is especially important if you are taking diabetes medicines or blood  thinners.  ? Your pain management plan. This is especially important if you plan to breastfeed your baby.  ? How long you will be in the hospital after the procedure.  ? Any concerns you may have about receiving blood products, if you need them during the procedure.  ? Cord blood banking, if you plan to collect your baby's umbilical cord blood.   You may also want to ask your health care provider:  ? Whether you will be able to hold or breastfeed your baby while you are still in the operating room.  ? Whether your baby can stay with you immediately after the procedure and during your recovery.  ? Whether a family member or a person of your choice can go with you into the operating room and stay with you during the procedure, immediately after the procedure, and during your recovery.  What happens during the procedure?     An IV will be inserted into one of your veins.   Fluid and medicines, such as antibiotics, will be given before the surgery.   Fetal monitors will be placed on your abdomen to check your baby's heart rate.   You may be given a special warming gown to wear to keep your temperature stable.   A catheter may be inserted into your bladder through your urethra. This drains your urine during the procedure.   You may be given one or more of   the following:  ? A medicine to numb the area (local anesthetic).  ? A medicine to make you fall asleep (general anesthetic).  ? A medicine (regional anesthetic) that is injected into your back or through a small thin tube placed in your back (spinal anesthetic or epidural anesthetic). This numbs everything below the injection site and allows you to stay awake during your procedure. If this makes you feel nauseous, tell your health care provider. Medicines will be available to help reduce any nausea you may feel.   An incision will be made in your abdomen, and then in your uterus.   If you are awake during your procedure, you may feel tugging and pulling in  your abdomen, but you should not feel pain. If you feel pain, tell your health care provider immediately.   Your baby will be removed from your uterus. You may feel more pressure or pushing while this happens.   Immediately after birth, your baby will be dried and kept warm. You may be able to hold and breastfeed your baby.   The umbilical cord may be clamped and cut during this time. This usually occurs after waiting a period of 1-2 minutes after delivery.   Your placenta will be removed from your uterus.   Your incisions will be closed with stitches (sutures). Staples, skin glue, or adhesive strips may also be applied to the incision in your abdomen.   Bandages (dressings) may be placed over the incision in your abdomen.  The procedure may vary among health care providers and hospitals.  What happens after the procedure?   Your blood pressure, heart rate, breathing rate, and blood oxygen level will be monitored until you are discharged from the hospital.   You may continue to receive fluids and medicines through an IV.   You will have some pain. Medicines will be available to help control your pain.   To help prevent blood clots:  ? You may be given medicines.  ? You may have to wear compression stockings or devices.  ? You will be encouraged to walk around when you are able.   Hospital staff will encourage and support bonding with your baby. Your hospital may have you and your baby to stay in the same room (rooming in) during your hospital stay to encourage successful bonding and breastfeeding.   You may be encouraged to cough and breathe deeply often. This helps to prevent lung problems.   If you have a catheter draining your urine, it will be removed as soon as possible after your procedure.  Summary   Cesarean birth, or cesarean delivery, is the surgical delivery of a baby through an incision in the abdomen and the uterus.   Follow instructions from your health care provider about eating or  drinking restrictions before the procedure.   You will have some pain after the procedure. Medicines will be available to help control your pain.   Hospital staff will encourage and support bonding with your baby after the procedure. Your hospital may have you and your baby to stay in the same room (rooming in) during your hospital stay to encourage successful bonding and breastfeeding.  This information is not intended to replace advice given to you by your health care provider. Make sure you discuss any questions you have with your health care provider.  Document Released: 12/24/2004 Document Revised: 06/30/2017 Document Reviewed: 06/30/2017  Elsevier Interactive Patient Education  2019 [employer]vier Inc[employer]

## 2018-01-29 NOTE — Progress Notes (Signed)
   PRENATAL VISIT NOTE  Subjective:  Cindy Pearson is a 23 y.o. [id number] at [redacted]w[redacted]d being seen today for ongoing prenatal care.  She is currently monitored for the following issues for this high-risk pregnancy and has Late prenatal care affecting pregnancy, antepartum, third trimester; S/P cesarean section; Chlamydia; Supervision of other normal pregnancy, antepartum; and Trichomoniasis on their problem list.  Patient reports no complaints.  Contractions: Irregular. Vag. Bleeding: None.  Movement: Present. Denies leaking of fluid.   The following portions of the patient's history were reviewed and updated as appropriate: allergies, current medications, past family history, past medical history, past social history, past surgical history and problem list. Problem list updated.  Objective:   Vitals:   01/29/18 0826  BP: 104/68  Pulse: 71  Weight: 159 lb 8 oz (72.3 kg)    Fetal Status: Fetal Heart Rate (bpm): 138 Fundal Height: 35 cm Movement: Present     General:  Alert, oriented and cooperative. Patient is in no acute distress.  Skin: Skin is warm and dry. No rash noted.   Cardiovascular: Normal heart rate noted  Respiratory: Normal respiratory effort, no problems with respiration noted  Abdomen: Soft, gravid, appropriate for gestational age.  Pain/Pressure: Present     Pelvic: Cervical exam deferred        Extremities: Normal range of motion.  Edema: Trace  Mental Status: Normal mood and affect. Normal behavior. Normal judgment and thought content.   Assessment and Plan:  Pregnancy: G5P3013 at [redacted]w[redacted]d  1. Supervision of other normal pregnancy, antepartum Doing well  2. S/P cesarean section Scheduled repeat in 1 week  Term labor symptoms and general obstetric precautions including but not limited to vaginal bleeding, contractions, leaking of fluid and fetal movement were reviewed in detail with the patient. Please refer to After Visit Summary for other counseling recommendations.    Return if symptoms worsen or fail to improve, for postpartum.  Future Appointments  Date Time Provider Department Center  02/04/2018  9:15 AM [facility] PAT 5 [facility] None    Scheryl Darter, MD

## 2018-01-30 ENCOUNTER — Telehealth (HOSPITAL_COMMUNITY): Payer: Self-pay | Admitting: *Deleted

## 2018-01-30 NOTE — Telephone Encounter (Signed)
Preadmission screen  

## 2018-02-01 ENCOUNTER — Other Ambulatory Visit: Payer: Self-pay | Admitting: Obstetrics

## 2018-02-01 DIAGNOSIS — N760 Acute vaginitis: Principal | ICD-10-CM

## 2018-02-01 DIAGNOSIS — B9689 Other specified bacterial agents as the cause of diseases classified elsewhere: Secondary | ICD-10-CM

## 2018-02-01 DIAGNOSIS — A749 Chlamydial infection, unspecified: Secondary | ICD-10-CM

## 2018-02-01 DIAGNOSIS — O98819 Other maternal infectious and parasitic diseases complicating pregnancy, unspecified trimester: Secondary | ICD-10-CM

## 2018-02-01 MED ORDER — CEFIXIME 400 MG PO CAPS: 400.0000 mg | ORAL_CAPSULE | Freq: Once | ORAL | 0 refills | Status: AC

## 2018-02-01 MED ORDER — AZITHROMYCIN 500 MG PO TABS: 1000.0000 mg | ORAL_TABLET | Freq: Once | ORAL | 0 refills | Status: AC

## 2018-02-01 MED ORDER — TINIDAZOLE 500 MG PO TABS: 1000.0000 mg | ORAL_TABLET | Freq: Every day | ORAL | 2 refills | Status: DC

## 2018-02-02 ENCOUNTER — Telehealth (HOSPITAL_COMMUNITY): Payer: Self-pay | Admitting: *Deleted

## 2018-02-02 ENCOUNTER — Telehealth: Payer: Self-pay

## 2018-02-02 NOTE — Telephone Encounter (Signed)
Preadmission screen  

## 2018-02-02 NOTE — Telephone Encounter (Signed)
Attempted to contact about results, rx sent, no answer, left vm to call. Faxed form to [facility].

## 2018-02-03 ENCOUNTER — Encounter (HOSPITAL_COMMUNITY): Payer: Self-pay

## 2018-02-03 ENCOUNTER — Telehealth: Payer: Self-pay

## 2018-02-03 NOTE — Telephone Encounter (Signed)
Attempted to contact about results, no answer, left vm 

## 2018-02-04 ENCOUNTER — Encounter (HOSPITAL_COMMUNITY)
Admission: RE | Admit: 2018-02-04 | Discharge: 2018-02-04 | Disposition: A | Discharge status: Home / Self Care | Payer: Medicaid Other | Source: Ambulatory Visit | Attending: Obstetrics and Gynecology | Admitting: Obstetrics and Gynecology

## 2018-02-04 LAB — CBC
HCT: 33.4 % — ABNORMAL LOW (ref 36.0–46.0)
HEMOGLOBIN: 10.8 g/dL — AB (ref 12.0–15.0)
MCH: 26.9 pg (ref 26.0–34.0)
MCHC: 32.3 g/dL (ref 30.0–36.0)
MCV: 83.1 fL (ref 80.0–100.0)
Platelets: 146 10*3/uL — ABNORMAL LOW (ref 150–400)
RBC: 4.02 MIL/uL (ref 3.87–5.11)
RDW: 13.8 % (ref 11.5–15.5)
WBC: 6.8 10*3/uL (ref 4.0–10.5)
nRBC: 0 % (ref 0.0–0.2)

## 2018-02-04 LAB — TYPE AND SCREEN
ABO/RH(D): B POS
Antibody Screen: NEGATIVE

## 2018-02-04 NOTE — Patient Instructions (Signed)
Cindy Pearson  02/04/2018   Your procedure is scheduled on:  02/05/2018  Enter through the Main Entrance of [facility] at 0745 AM.  Pick up the phone at the desk and dial [phone]  Call this number if you have problems the morning of surgery:[phone]  Remember:   Do not eat food:(After Midnight) Desps de medianoche.  Do not drink clear liquids: (After Midnight) Desps de medianoche.  Take these medicines the morning of surgery with A SIP OF WATER: none   Do not wear jewelry, make-up or nail polish.  Do not wear lotions, powders, or perfumes. Do not wear deodorant.  Do not shave 48 hours prior to surgery.  Do not bring valuables to the hospital.  [facility] is not   responsible for any belongings or valuables brought to the hospital.  Contacts, dentures or bridgework may not be worn into surgery.  Leave suitcase in the car. After surgery it may be brought to your room.  For patients admitted to the hospital, checkout time is 11:00 AM the day of              discharge.    N/A   Please read over the following fact sheets that you were given:   Surgical Site Infection Prevention

## 2018-02-05 ENCOUNTER — Inpatient Hospital Stay (HOSPITAL_COMMUNITY): Payer: Medicaid Other | Admitting: Anesthesiology

## 2018-02-05 ENCOUNTER — Encounter (HOSPITAL_COMMUNITY): Payer: Self-pay | Admitting: *Deleted

## 2018-02-05 ENCOUNTER — Inpatient Hospital Stay (HOSPITAL_COMMUNITY)
Admission: RE | Admit: 2018-02-05 | Discharge: 2018-02-08 | DRG: 788 | Disposition: A | Discharge status: Home / Self Care | Payer: Medicaid Other | Attending: Obstetrics and Gynecology | Admitting: Obstetrics and Gynecology

## 2018-02-05 ENCOUNTER — Encounter (HOSPITAL_COMMUNITY)
Admission: RE | Disposition: A | Discharge status: Home / Self Care | Payer: Self-pay | Source: Home / Self Care | Attending: Obstetrics and Gynecology

## 2018-02-05 ENCOUNTER — Other Ambulatory Visit: Payer: Self-pay

## 2018-02-05 DIAGNOSIS — Z3A39 39 weeks gestation of pregnancy: Secondary | ICD-10-CM

## 2018-02-05 DIAGNOSIS — O99214 Obesity complicating childbirth: Secondary | ICD-10-CM | POA: Diagnosis present

## 2018-02-05 DIAGNOSIS — Z3480 Encounter for supervision of other normal pregnancy, unspecified trimester: Secondary | ICD-10-CM

## 2018-02-05 DIAGNOSIS — O34211 Maternal care for low transverse scar from previous cesarean delivery: Principal | ICD-10-CM | POA: Diagnosis present

## 2018-02-05 DIAGNOSIS — O9902 Anemia complicating childbirth: Secondary | ICD-10-CM | POA: Diagnosis present

## 2018-02-05 DIAGNOSIS — Z98891 History of uterine scar from previous surgery: Secondary | ICD-10-CM

## 2018-02-05 DIAGNOSIS — Z789 Other specified health status: Secondary | ICD-10-CM

## 2018-02-05 DIAGNOSIS — D649 Anemia, unspecified: Secondary | ICD-10-CM | POA: Diagnosis present

## 2018-02-05 DIAGNOSIS — Z880 Allergy status to penicillin: Secondary | ICD-10-CM | POA: Diagnosis not present

## 2018-02-05 DIAGNOSIS — Z87891 Personal history of nicotine dependence: Secondary | ICD-10-CM

## 2018-02-05 DIAGNOSIS — O34219 Maternal care for unspecified type scar from previous cesarean delivery: Secondary | ICD-10-CM

## 2018-02-05 LAB — CBC
HEMATOCRIT: 33.4 % — AB (ref 36.0–46.0)
Hemoglobin: 10.8 g/dL — ABNORMAL LOW (ref 12.0–15.0)
MCH: 26.8 pg (ref 26.0–34.0)
MCHC: 32.3 g/dL (ref 30.0–36.0)
MCV: 82.9 fL (ref 80.0–100.0)
Platelets: 152 10*3/uL (ref 150–400)
RBC: 4.03 MIL/uL (ref 3.87–5.11)
RDW: 13.5 % (ref 11.5–15.5)
WBC: 12.6 10*3/uL — ABNORMAL HIGH (ref 4.0–10.5)
nRBC: 0 % (ref 0.0–0.2)

## 2018-02-05 LAB — CREATININE, SERUM
Creatinine, Ser: 0.35 mg/dL — ABNORMAL LOW (ref 0.44–1.00)
GFR calc Af Amer: >60 mL/min (ref >60)
GFR calc non Af Amer: >60 mL/min (ref >60)

## 2018-02-05 LAB — RPR: RPR Ser Ql: NONREACTIVE

## 2018-02-05 SURGERY — Surgical Case
Anesthesia: Spinal | Site: Abdomen | Wound class: Clean Contaminated

## 2018-02-05 MED ORDER — PROMETHAZINE HCL 25 MG/ML IJ SOLN: 6.2500 mg | INTRAMUSCULAR | Status: DC | PRN

## 2018-02-05 MED ORDER — STERILE WATER FOR IRRIGATION IR SOLN
Status: DC | PRN
  Administered 2018-02-05: 1

## 2018-02-05 MED ORDER — FAMOTIDINE 20 MG PO TABS
20.0000 mg | ORAL_TABLET | Freq: Once | ORAL | Status: AC
  Administered 2018-02-05: 20 mg via ORAL
  Filled 2018-02-05: qty 1

## 2018-02-05 MED ORDER — DIPHENHYDRAMINE HCL 25 MG PO CAPS
25.0000 mg | ORAL_CAPSULE | Freq: Four times a day (QID) | ORAL | Status: DC | PRN
  Administered 2018-02-06: 25 mg via ORAL
  Filled 2018-02-05: qty 1

## 2018-02-05 MED ORDER — METOCLOPRAMIDE HCL 5 MG/ML IJ SOLN
INTRAMUSCULAR | Status: AC
  Filled 2018-02-05: qty 2

## 2018-02-05 MED ORDER — KETOROLAC TROMETHAMINE 30 MG/ML IJ SOLN
30.0000 mg | Freq: Four times a day (QID) | INTRAMUSCULAR | Status: DC | PRN
  Administered 2018-02-05: 30 mg via INTRAMUSCULAR

## 2018-02-05 MED ORDER — FENTANYL CITRATE (PF) 100 MCG/2ML IJ SOLN
INTRAMUSCULAR | Status: DC | PRN
  Administered 2018-02-05: 15 ug via INTRATHECAL

## 2018-02-05 MED ORDER — FENTANYL CITRATE (PF) 100 MCG/2ML IJ SOLN
25.0000 ug | INTRAMUSCULAR | Status: DC | PRN
  Administered 2018-02-05: 25 ug via INTRAVENOUS

## 2018-02-05 MED ORDER — PHENYLEPHRINE 8 MG IN D5W 100 ML (0.08MG/ML) PREMIX OPTIME
INJECTION | INTRAVENOUS | Status: DC | PRN
  Administered 2018-02-05: 70 ug/min via INTRAVENOUS

## 2018-02-05 MED ORDER — SOD CITRATE-CITRIC ACID 500-334 MG/5ML PO SOLN: 30.0000 mL | Freq: Once | ORAL | Status: DC

## 2018-02-05 MED ORDER — SENNOSIDES-DOCUSATE SODIUM 8.6-50 MG PO TABS
2.0000 | ORAL_TABLET | ORAL | Status: DC
  Administered 2018-02-06 – 2018-02-07 (×3): 2 via ORAL
  Filled 2018-02-05 (×3): qty 2

## 2018-02-05 MED ORDER — METOCLOPRAMIDE HCL 5 MG/ML IJ SOLN
INTRAMUSCULAR | Status: DC | PRN
  Administered 2018-02-05: 10 mg via INTRAVENOUS

## 2018-02-05 MED ORDER — SCOPOLAMINE 1 MG/3DAYS TD PT72
1.0000 | MEDICATED_PATCH | Freq: Once | TRANSDERMAL | Status: DC
  Administered 2018-02-05: 1.5 mg via TRANSDERMAL
  Filled 2018-02-05: qty 1

## 2018-02-05 MED ORDER — FENTANYL CITRATE (PF) 100 MCG/2ML IJ SOLN
INTRAMUSCULAR | Status: AC
  Filled 2018-02-05: qty 2

## 2018-02-05 MED ORDER — DIPHENHYDRAMINE HCL 50 MG/ML IJ SOLN
12.5000 mg | INTRAMUSCULAR | Status: DC | PRN
  Administered 2018-02-05: 12.5 mg via INTRAVENOUS
  Filled 2018-02-05: qty 1

## 2018-02-05 MED ORDER — NALBUPHINE HCL 10 MG/ML IJ SOLN: 5.0000 mg | INTRAMUSCULAR | Status: DC | PRN

## 2018-02-05 MED ORDER — BUPIVACAINE HCL (PF) 0.5 % IJ SOLN
INTRAMUSCULAR | Status: DC | PRN
  Administered 2018-02-05: 30 mL

## 2018-02-05 MED ORDER — OXYTOCIN 10 UNIT/ML IJ SOLN
INTRAMUSCULAR | Status: AC
  Filled 2018-02-05: qty 4

## 2018-02-05 MED ORDER — OXYTOCIN 10 UNIT/ML IJ SOLN
INTRAVENOUS | Status: DC | PRN
  Administered 2018-02-05: 40 [IU] via INTRAVENOUS

## 2018-02-05 MED ORDER — ONDANSETRON HCL 4 MG/2ML IJ SOLN: 4.0000 mg | Freq: Three times a day (TID) | INTRAMUSCULAR | Status: DC | PRN

## 2018-02-05 MED ORDER — SIMETHICONE 80 MG PO CHEW
80.0000 mg | CHEWABLE_TABLET | Freq: Three times a day (TID) | ORAL | Status: DC
  Administered 2018-02-05 – 2018-02-07 (×7): 80 mg via ORAL
  Filled 2018-02-05 (×7): qty 1

## 2018-02-05 MED ORDER — PHENYLEPHRINE 8 MG IN D5W 100 ML (0.08MG/ML) PREMIX OPTIME
INJECTION | INTRAVENOUS | Status: AC
  Filled 2018-02-05: qty 100

## 2018-02-05 MED ORDER — ACETAMINOPHEN 500 MG PO TABS
1000.0000 mg | ORAL_TABLET | Freq: Four times a day (QID) | ORAL | Status: DC
  Administered 2018-02-05 – 2018-02-06 (×3): 1000 mg via ORAL
  Filled 2018-02-05 (×3): qty 2

## 2018-02-05 MED ORDER — PRENATAL MULTIVITAMIN CH
1.0000 | ORAL_TABLET | Freq: Every day | ORAL | Status: DC
  Administered 2018-02-06 – 2018-02-07 (×2): 1 via ORAL
  Filled 2018-02-05 (×2): qty 1

## 2018-02-05 MED ORDER — SOD CITRATE-CITRIC ACID 500-334 MG/5ML PO SOLN
30.0000 mL | ORAL | Status: AC
  Administered 2018-02-05: 30 mL via ORAL
  Filled 2018-02-05: qty 15

## 2018-02-05 MED ORDER — KETOROLAC TROMETHAMINE 30 MG/ML IJ SOLN: 30.0000 mg | Freq: Four times a day (QID) | INTRAMUSCULAR | Status: DC | PRN

## 2018-02-05 MED ORDER — ONDANSETRON HCL 4 MG/2ML IJ SOLN
INTRAMUSCULAR | Status: AC
  Filled 2018-02-05: qty 2

## 2018-02-05 MED ORDER — NALBUPHINE HCL 10 MG/ML IJ SOLN
5.0000 mg | INTRAMUSCULAR | Status: DC | PRN
  Administered 2018-02-05: 5 mg via INTRAVENOUS
  Filled 2018-02-05: qty 1

## 2018-02-05 MED ORDER — PHENYLEPHRINE HCL 10 MG/ML IJ SOLN
INTRAMUSCULAR | Status: DC | PRN
  Administered 2018-02-05 (×2): 80 ug via INTRAVENOUS

## 2018-02-05 MED ORDER — ENOXAPARIN SODIUM 40 MG/0.4ML ~~LOC~~ SOLN
40.0000 mg | SUBCUTANEOUS | Status: DC
  Administered 2018-02-06: 40 mg via SUBCUTANEOUS
  Filled 2018-02-05 (×2): qty 0.4

## 2018-02-05 MED ORDER — OXYTOCIN 40 UNITS IN NORMAL SALINE INFUSION - SIMPLE MED: 2.5000 [IU]/h | INTRAVENOUS | Status: DC

## 2018-02-05 MED ORDER — TETANUS-DIPHTH-ACELL PERTUSSIS 5-2.5-18.5 LF-MCG/0.5 IM SUSP: 0.5000 mL | Freq: Once | INTRAMUSCULAR | Status: DC

## 2018-02-05 MED ORDER — LACTATED RINGERS IV SOLN
INTRAVENOUS | Status: DC
  Administered 2018-02-05 (×4): via INTRAVENOUS

## 2018-02-05 MED ORDER — SCOPOLAMINE 1 MG/3DAYS TD PT72: 1.0000 | MEDICATED_PATCH | Freq: Once | TRANSDERMAL | Status: DC

## 2018-02-05 MED ORDER — MORPHINE SULFATE (PF) 0.5 MG/ML IJ SOLN
INTRAMUSCULAR | Status: DC | PRN
  Administered 2018-02-05: .15 mg via INTRATHECAL

## 2018-02-05 MED ORDER — SODIUM CHLORIDE 0.9 % IV SOLN
500.0000 mg | INTRAVENOUS | Status: AC
  Administered 2018-02-05 (×2): 500 mg via INTRAVENOUS
  Filled 2018-02-05 (×2): qty 500

## 2018-02-05 MED ORDER — SIMETHICONE 80 MG PO CHEW
80.0000 mg | CHEWABLE_TABLET | ORAL | Status: DC
  Administered 2018-02-06 – 2018-02-07 (×3): 80 mg via ORAL
  Filled 2018-02-05 (×3): qty 1

## 2018-02-05 MED ORDER — ONDANSETRON HCL 4 MG/2ML IJ SOLN
INTRAMUSCULAR | Status: DC | PRN
  Administered 2018-02-05: 4 mg via INTRAVENOUS

## 2018-02-05 MED ORDER — ZOLPIDEM TARTRATE 5 MG PO TABS: 5.0000 mg | ORAL_TABLET | Freq: Every evening | ORAL | Status: DC | PRN

## 2018-02-05 MED ORDER — NALOXONE HCL 4 MG/10ML IJ SOLN
1.0000 ug/kg/h | INTRAVENOUS | Status: DC | PRN
  Filled 2018-02-05: qty 5

## 2018-02-05 MED ORDER — NALBUPHINE HCL 10 MG/ML IJ SOLN
5.0000 mg | Freq: Once | INTRAMUSCULAR | Status: AC | PRN
  Administered 2018-02-05: 5 mg via INTRAVENOUS

## 2018-02-05 MED ORDER — NALBUPHINE HCL 10 MG/ML IJ SOLN
INTRAMUSCULAR | Status: AC
  Filled 2018-02-05: qty 1

## 2018-02-05 MED ORDER — MENTHOL 3 MG MT LOZG: 1.0000 | LOZENGE | OROMUCOSAL | Status: DC | PRN

## 2018-02-05 MED ORDER — ACETAMINOPHEN 10 MG/ML IV SOLN
INTRAVENOUS | Status: AC
  Filled 2018-02-05: qty 100

## 2018-02-05 MED ORDER — WITCH HAZEL-GLYCERIN EX PADS: 1.0000 "application " | MEDICATED_PAD | CUTANEOUS | Status: DC | PRN

## 2018-02-05 MED ORDER — MORPHINE SULFATE (PF) 0.5 MG/ML IJ SOLN
INTRAMUSCULAR | Status: AC
  Filled 2018-02-05: qty 10

## 2018-02-05 MED ORDER — SODIUM CHLORIDE 0.9% FLUSH: 3.0000 mL | INTRAVENOUS | Status: DC | PRN

## 2018-02-05 MED ORDER — DIPHENHYDRAMINE HCL 25 MG PO CAPS
25.0000 mg | ORAL_CAPSULE | ORAL | Status: DC | PRN
  Filled 2018-02-05: qty 1

## 2018-02-05 MED ORDER — ACETAMINOPHEN 500 MG PO TABS
ORAL_TABLET | ORAL | Status: AC
  Filled 2018-02-05: qty 2

## 2018-02-05 MED ORDER — GENTAMICIN SULFATE 40 MG/ML IJ SOLN
INTRAVENOUS | Status: DC
  Filled 2018-02-05: qty 7.25

## 2018-02-05 MED ORDER — COCONUT OIL OIL: 1.0000 "application " | TOPICAL_OIL | Status: DC | PRN

## 2018-02-05 MED ORDER — MEPERIDINE HCL 25 MG/ML IJ SOLN: 6.2500 mg | INTRAMUSCULAR | Status: DC | PRN

## 2018-02-05 MED ORDER — IBUPROFEN 600 MG PO TABS
600.0000 mg | ORAL_TABLET | Freq: Four times a day (QID) | ORAL | Status: DC | PRN
  Administered 2018-02-06 – 2018-02-08 (×7): 600 mg via ORAL
  Filled 2018-02-05 (×7): qty 1

## 2018-02-05 MED ORDER — ACETAMINOPHEN 500 MG PO TABS
1000.0000 mg | ORAL_TABLET | Freq: Four times a day (QID) | ORAL | Status: DC
  Administered 2018-02-05: 1000 mg via ORAL

## 2018-02-05 MED ORDER — OXYCODONE-ACETAMINOPHEN 5-325 MG PO TABS
1.0000 | ORAL_TABLET | ORAL | Status: DC | PRN
  Administered 2018-02-06 (×2): 1 via ORAL
  Administered 2018-02-07: 2 via ORAL
  Administered 2018-02-07: 1 via ORAL
  Administered 2018-02-07 – 2018-02-08 (×3): 2 via ORAL
  Filled 2018-02-05: qty 2
  Filled 2018-02-05: qty 1
  Filled 2018-02-05 (×2): qty 2
  Filled 2018-02-05 (×2): qty 1
  Filled 2018-02-05: qty 2

## 2018-02-05 MED ORDER — BUPIVACAINE HCL (PF) 0.5 % IJ SOLN
INTRAMUSCULAR | Status: AC
  Filled 2018-02-05: qty 30

## 2018-02-05 MED ORDER — KETOROLAC TROMETHAMINE 30 MG/ML IJ SOLN
INTRAMUSCULAR | Status: AC
  Filled 2018-02-05: qty 1

## 2018-02-05 MED ORDER — SODIUM CHLORIDE 0.9 % IR SOLN
Status: DC | PRN
  Administered 2018-02-05: 1

## 2018-02-05 MED ORDER — DIBUCAINE 1 % RE OINT: 1.0000 "application " | TOPICAL_OINTMENT | RECTAL | Status: DC | PRN

## 2018-02-05 MED ORDER — KETOROLAC TROMETHAMINE 30 MG/ML IJ SOLN
30.0000 mg | Freq: Four times a day (QID) | INTRAMUSCULAR | Status: DC
  Administered 2018-02-05 – 2018-02-06 (×3): 30 mg via INTRAVENOUS
  Filled 2018-02-05 (×3): qty 1

## 2018-02-05 MED ORDER — NALOXONE HCL 0.4 MG/ML IJ SOLN: 0.4000 mg | INTRAMUSCULAR | Status: DC | PRN

## 2018-02-05 MED ORDER — NALBUPHINE HCL 10 MG/ML IJ SOLN: 5.0000 mg | Freq: Once | INTRAMUSCULAR | Status: AC | PRN

## 2018-02-05 MED ORDER — DEXTROSE 5 % IV SOLN: 1000.0000 mg | Freq: Once | INTRAVENOUS | Status: DC

## 2018-02-05 MED ORDER — LACTATED RINGERS IV SOLN
INTRAVENOUS | Status: DC
  Administered 2018-02-05: 21:00:00 via INTRAVENOUS

## 2018-02-05 MED ORDER — DEXAMETHASONE SODIUM PHOSPHATE 4 MG/ML IJ SOLN
INTRAMUSCULAR | Status: DC | PRN
  Administered 2018-02-05: 4 mg via INTRAVENOUS

## 2018-02-05 MED ORDER — BUPIVACAINE IN DEXTROSE 0.75-8.25 % IT SOLN
INTRATHECAL | Status: DC | PRN
  Administered 2018-02-05: 1.5 mL via INTRATHECAL

## 2018-02-05 MED ORDER — DEXTROSE 5 % IV SOLN
INTRAVENOUS | Status: DC | PRN
  Administered 2018-02-05: 113.25 mL via INTRAVENOUS

## 2018-02-05 MED ORDER — SIMETHICONE 80 MG PO CHEW: 80.0000 mg | CHEWABLE_TABLET | ORAL | Status: DC | PRN

## 2018-02-05 SURGICAL SUPPLY — 42 items
BENZOIN TINCTURE PRP APPL 2/3 (GAUZE/BANDAGES/DRESSINGS) ×3 IMPLANT
CHLORAPREP W/TINT 26ML (MISCELLANEOUS) ×3 IMPLANT
CLAMP CORD UMBIL (MISCELLANEOUS) IMPLANT
CLOSURE STERI STRIP 1/2 X4 (GAUZE/BANDAGES/DRESSINGS) ×3 IMPLANT
CLOSURE WOUND 1/2 X4 (GAUZE/BANDAGES/DRESSINGS)
CLOTH BEACON ORANGE TIMEOUT ST (SAFETY) ×3 IMPLANT
DRSG OPSITE POSTOP 4X10 (GAUZE/BANDAGES/DRESSINGS) ×3 IMPLANT
ELECT REM PT RETURN 9FT ADLT (ELECTROSURGICAL) ×3
ELECTRODE REM PT RTRN 9FT ADLT (ELECTROSURGICAL) ×1 IMPLANT
EXTRACTOR VACUUM BELL STYLE (SUCTIONS) IMPLANT
GAUZE SPONGE 4X4 12PLY STRL LF (GAUZE/BANDAGES/DRESSINGS) ×6 IMPLANT
GLOVE BIOGEL PI IND STRL 6.5 (GLOVE) ×1 IMPLANT
GLOVE BIOGEL PI IND STRL 7.0 (GLOVE) ×2 IMPLANT
GLOVE BIOGEL PI INDICATOR 6.5 (GLOVE) ×2
GLOVE BIOGEL PI INDICATOR 7.0 (GLOVE) ×4
GLOVE ORTHOPEDIC STR SZ6.5 (GLOVE) ×3 IMPLANT
GOWN STRL REUS W/TWL LRG LVL3 (GOWN DISPOSABLE) ×9 IMPLANT
HEMOSTAT ARISTA ABSORB 3G PWDR (HEMOSTASIS) ×3 IMPLANT
HEMOSTAT SURGICEL 4X8 (HEMOSTASIS) ×3 IMPLANT
KIT ABG SYR 3ML LUER SLIP (SYRINGE) IMPLANT
NEEDLE HYPO 22GX1.5 SAFETY (NEEDLE) ×3 IMPLANT
NEEDLE HYPO 25X1 1.5 SAFETY (NEEDLE) IMPLANT
NS IRRIG 1000ML POUR BTL (IV SOLUTION) ×3 IMPLANT
PACK C SECTION WH (CUSTOM PROCEDURE TRAY) ×3 IMPLANT
PAD ABD 7.5X8 STRL (GAUZE/BANDAGES/DRESSINGS) ×3 IMPLANT
PAD OB MATERNITY 4.3X12.25 (PERSONAL CARE ITEMS) ×3 IMPLANT
PENCIL SMOKE EVAC W/HOLSTER (ELECTROSURGICAL) ×3 IMPLANT
RETRACTOR WND ALEXIS 25 LRG (MISCELLANEOUS) ×1 IMPLANT
RTRCTR WOUND ALEXIS 25CM LRG (MISCELLANEOUS) ×3
SPONGE LAP 18X18 X RAY DECT (DISPOSABLE) ×3 IMPLANT
STRIP CLOSURE SKIN 1/2X4 (GAUZE/BANDAGES/DRESSINGS) IMPLANT
SUT MON AB 4-0 PS1 27 (SUTURE) ×3 IMPLANT
SUT PLAIN 2 0 (SUTURE) ×2
SUT PLAIN ABS 2-0 CT1 27XMFL (SUTURE) ×1 IMPLANT
SUT VIC AB 0 CT1 36 (SUTURE) ×6 IMPLANT
SUT VIC AB 0 CTX 36 (SUTURE) ×2
SUT VIC AB 0 CTX36XBRD ANBCTRL (SUTURE) ×1 IMPLANT
SUT VIC AB 2-0 CT1 (SUTURE) ×3 IMPLANT
SYR CONTROL 10ML LL (SYRINGE) ×3 IMPLANT
TOWEL OR 17X24 6PK STRL BLUE (TOWEL DISPOSABLE) ×3 IMPLANT
TRAY FOLEY W/BAG SLVR 14FR LF (SET/KITS/TRAYS/PACK) ×3 IMPLANT
WATER STERILE IRR 1000ML POUR (IV SOLUTION) ×3 IMPLANT

## 2018-02-05 NOTE — Op Note (Addendum)
Cindy EdingerKiara M Pearson PROCEDURE DATE: 02/05/2018  PREOPERATIVE DIAGNOSES: Intrauterine pregnancy at 7035w0d weeks gestation; prior uterine incision x2  POSTOPERATIVE DIAGNOSES: The same  PROCEDURE: Repeat Low Transverse Cesarean Section  SURGEON:  Baldemar LenisK. Meryl Davis, MD  ASSISTANT:  Gwenevere AbbotNimeka Phillip, MD, Ob Fellow  An experienced assistant was required given the standard of surgical care given the complexity of the case.  This assistant was needed for exposure, dissection, suctioning, retraction, instrument exchange, assisting with delivery with administration of fundal pressure, and for overall help during the procedure.   ANESTHESIOLOGY TEAM: Anesthesiologist: Cecile Hearingurk, Stephen Edward, MD CRNA: Earmon PhoenixWilkerson, Valerie P, CRNA  INDICATIONS: Cindy Pearson is a 23 y.o. [phone] at 4135w0d here for cesarean section secondary to the indications listed under preoperative diagnoses; please see preoperative note for further details.  The risks of cesarean section were discussed with the patient including but were not limited to: bleeding which may require transfusion or reoperation; infection which may require antibiotics; injury to bowel, bladder, ureters or other surrounding organs; injury to the fetus; need for additional procedures including hysterectomy in the event of a life-threatening hemorrhage; placental abnormalities wth subsequent pregnancies, incisional problems, thromboembolic phenomenon and other postoperative/anesthesia complications.  She consents to blood transfusion in the event of an emergency. The patient verbalized understanding of the plan, giving informed written consent for the procedure.    FINDINGS:  Viable female infant in compound cephalic presentation.  Apgars 8 and 9.  Clear amniotic fluid.  Intact placenta, three vessel cord.  Normal uterus with thin lower uterine segment, fallopian tubes and ovaries bilaterally. Omentum adhered to anterior abdominal wall on right, easily retracted for  visualization.  ANESTHESIA: Spinal  INTRAVENOUS FLUIDS: 2300 ml   ESTIMATED BLOOD LOSS: 298 ml URINE OUTPUT:  550 ml SPECIMENS: Placenta sent to L&D COMPLICATIONS: None immediate  PROCEDURE IN DETAIL:  The patient preoperatively received intravenous antibiotics and had sequential compression devices applied to her lower extremities.  She was then taken to the operating room where spinal anesthesia was administered and was found to be adequate. She was then placed in a dorsal supine position with a leftward tilt, and prepped and draped in a sterile manner.  A foley catheter was placed into her bladder with sterile technique and attached to constant gravity.  After a timeout was performed, a Pfannenstiel skin incision was made with scalpel over her preexisting scar and carried through to the underlying layer of fascia. The fascia was incised in the midline, and this incision was extended bilaterally using the Mayo scissors.  Kocher clamps were applied to the superior aspect of the fascial incision and the underlying rectus muscles were dissected off sharply.  A similar process was carried out on the inferior aspect of the fascial incision. The rectus muscles were separated in the midline bluntly and the peritoneum was entered bluntly. The peritoneal incision was carefully extended bluntly laterally and caudad with good visualization of the bladder. The uterus appeared normal. An Alexis O retractor was placed into the incision for visualization taking care to ensure no bowel trapped behind retractor.  The bladder blade was inserted. A small bladder flap was made and the bladder bluntly dissected off the thin lower uterine segment. Attention was turned to the lower uterine segment where a low transverse hysterotomy was made with a scalpel and extended bilaterally bluntly. The infant was delivered from cephalic position, nose and mouth were bulb suctioned, and the cord clamped and cut after 1 minute. Infant  vigorous from delivery. The infant  was then handed over to the waiting neonatology team. Uterine massage was then performed, and the placenta delivered intact with a three-vessel cord. The uterus was then cleared of clots and debris.  The hysterotomy was closed with 0 Vicryl in a running locked fashion taking care to ensure good closure despite thin lower segment. After one layer was placed, the bladder was noted to be directly inferior to the hysterotomy and thus no second layer closure done. The fallopian tubes and ovaries were visualized bilaterally and normal appearing.    The pelvis was cleared of all clot and debris. Arista was placed over the hysterotomy and bladder for additional hemostasis.  Hemostasis was again confirmed on all surfaces. The inferior fascia and rectus muscles had some oozing and Arista and Surgicel were placed to help with hemostasis.  The fascia was then closed using 0 Vicryl in a running fashion.  The subcutaneous layer was irrigated, then reapproximated with 2-0 plain gut, and 30 ml of 0.5% Marcaine was injected subcutaneously around the incision.  The skin was closed with a 4-0 Monocryl subcuticular stitch. The patient tolerated the procedure well. Sponge, lap, instrument and needle counts were correct x 3.  She was taken to the recovery room in stable condition.    Gwenevere Abbot, MD Ob Fellow, [facility] for [employer], [facility]    Attestation of Attending Supervision of [place] Fellow: Evaluation and management procedures were performed by the [facility] Fellow under my supervision and collaboration.  I have reviewed the OB Fellow's note and chart, and I agree with the management and plan.  Baldemar Lenis, M.D. Attending Center for [employer] (Faculty Practice)  02/05/2018 2:25 PM

## 2018-02-05 NOTE — Anesthesia Preprocedure Evaluation (Signed)
Anesthesia Evaluation  Patient identified by MRN, date of birth, ID band Patient awake    Reviewed: Allergy & Precautions, NPO status , Patient's Chart, lab work & pertinent test results  Airway Mallampati: II  TM Distance: >3 FB Neck ROM: Full    Dental  (+) Teeth Intact, Dental Advisory Given   Pulmonary asthma , former smoker,    Pulmonary exam normal breath sounds clear to auscultation       Cardiovascular negative cardio ROS Normal cardiovascular exam Rhythm:Regular Rate:Normal     Neuro/Psych negative neurological ROS     GI/Hepatic negative GI ROS, (+)     substance abuse  marijuana use,   Endo/Other  negative endocrine ROSObesity   Renal/GU negative Renal ROS     Musculoskeletal negative musculoskeletal ROS (+)   Abdominal   Peds  Hematology negative hematology ROS (+) Plt 146k   Anesthesia Other Findings Day of surgery medications reviewed with the patient.  Reproductive/Obstetrics (+) Pregnancy Previous C-section x2                             Anesthesia Physical Anesthesia Plan  ASA: II  Anesthesia Plan: Spinal   Post-op Pain Management:    Induction:   PONV Risk Score and Plan: 2 and Treatment may vary due to age or medical condition and Scopolamine patch - Pre-op  Airway Management Planned: Natural Airway  Additional Equipment:   Intra-op Plan:   Post-operative Plan:   Informed Consent: I have reviewed the patients History and Physical, chart, labs and discussed the procedure including the risks, benefits and alternatives for the proposed anesthesia with the patient or authorized representative who has indicated his/her understanding and acceptance.     Dental advisory given  Plan Discussed with: CRNA, Anesthesiologist and Surgeon  Anesthesia Plan Comments:         Anesthesia Quick Evaluation

## 2018-02-05 NOTE — Progress Notes (Signed)
Pt with unreadable temps and low temps (96.1).  Pt not symptomatic.  Bair hugger applied, room temp increased, warm blankets given to pt.  Pt temp increased to 97.4 oral.  Dr. Desmond Lope notified and at bedside.  No new orders given, stated pt ok to go to motherbaby. Will report to [facility] RN.    Johnathan Hausen, RN

## 2018-02-05 NOTE — Anesthesia Postprocedure Evaluation (Signed)
Anesthesia Post Note  Patient: Cindy EdingerKiara M Hoeppner  Procedure(s) Performed: CESAREAN SECTION (N/A Abdomen)     Patient location during evaluation: PACU Anesthesia Type: Spinal Level of consciousness: oriented, awake and alert and awake Pain management: pain level controlled Vital Signs Assessment: post-procedure vital signs reviewed and stable Respiratory status: spontaneous breathing, respiratory function stable, patient connected to nasal cannula oxygen and nonlabored ventilation Cardiovascular status: blood pressure returned to baseline and stable Postop Assessment: no headache, no backache, no apparent nausea or vomiting and spinal receding Anesthetic complications: no    Last Vitals:  Vitals:   02/05/18 1616 02/05/18 1718  BP: (!) 96/55 92/72  Pulse: 64 70  Resp: 18   Temp: 36.6 C (!) 36.4 C  SpO2: 98% 99%    Last Pain:  Vitals:   02/05/18 1718  TempSrc: Oral  PainSc: 2    Pain Goal:                   Cecile HearingStephen Edward Kristie Bracewell

## 2018-02-05 NOTE — Transfer of Care (Signed)
Immediate Anesthesia Transfer of Care Note  Patient: Cindy Pearson  Procedure(s) Performed: CESAREAN SECTION (N/A Abdomen)  Patient Location: PACU  Anesthesia Type:Spinal  Level of Consciousness: awake, alert , oriented and patient cooperative  Airway & Oxygen Therapy: Patient Spontanous Breathing  Post-op Assessment: Report given to RN and Post -op Vital signs reviewed and stable  Post vital signs: Reviewed and stable  Last Vitals:  Vitals Value Taken Time  BP    Temp    Pulse    Resp 16 02/05/2018 11:01 AM  SpO2    Vitals shown include unvalidated device data.  Last Pain:  Vitals:   02/05/18 0805  TempSrc: Oral  PainSc:          Complications: No apparent anesthesia complications

## 2018-02-05 NOTE — Lactation Note (Addendum)
This note was copied from a baby's chart. Lactation Consultation Note  Patient Name: Cindy Pearson DCVUD'T Date: 02/05/2018 Reason for consult: Initial assessment;Term;Infant < 6lbs  9 hours old FT < 6 lbs female who is being exclusively BF by her mother, she's a P4 and somehow experienced BF. She was able to BF her first child for 3 weeks, didn't BF her second one and BF her third one for 3 1/2 months. She's already familiar with hand expression and has observed colostrum. She doesn't have a pump at home, but voiced that she was hoping to get one at the hospital. Explained to mom that we can set her up with the DEBP for her hospital stay (especially since baby is < 6 lbs) but that we can't loan those. Mom didn't receive WIC during the pregnancy either, so not eligible for a [facility] loaner at this point. Still awaiting UDS to confirm her (+) THC status during the pregnancy.  After LC came back with DEBP/pump kit and started to reviewed the feeding plan with mom, she was already on her phone and completely disengaged during [facility] consultation. Offered assistance with latch but mom declined, female visitor present in the room, but mom didn't want her to leave. Baby was STS to mother's breast, asleep though, not cueing. Stressed the importance of feeding baby every 3 hours and also pumping every 3 hours and finger/spoon feed her any amount of colostrum she may get. Mom aware that baby will need to be supplemented (preferabley with mother's own milk) at some point if she's still sleepy and not latching on consistently.  Feeding plan:  1. Encouraged mom to feed baby STS every 3 hours or sooner if feeding cues are present 2. Mom will pump every 3 hours and at least once at night and will spoon/finger feed baby any amount of breastmilk she may get  BF brochure, BF resources and feeding diary were reviewed. Mom reported all questions and concerns were answered, she's aware of [place] services and will call  PRN.  Maternal Data Formula Feeding for Exclusion: No Has patient been taught Hand Expression?: Yes Does the patient have breastfeeding experience prior to this delivery?: Yes  Feeding Feeding Type: Breast Fed   Interventions Interventions: Breast feeding basics reviewed;DEBP  Lactation Tools Discussed/Used Tools: Pump Breast pump type: Double-Electric Breast Pump WIC Program: No Pump Review: Setup, frequency, and cleaning Initiated by:: MPeck Date initiated:: 02/05/18   Consult Status Consult Status: Follow-up Date: 02/06/18 Follow-up type: In-patient    Cindy Pearson 02/05/2018, 7:13 PM

## 2018-02-05 NOTE — Discharge Summary (Signed)
Postpartum Discharge Summary     Patient Name: Cindy EdingerKiara M Albus DOB: 07/11/1995 MRN: [id number]  Date of admission: 02/05/2018 Delivering Provider: Conan BowensAVIS, KELLY M   Date of discharge: 02/07/2018  Admitting diagnosis: previous cesarean section Intrauterine pregnancy: 8812w0d     Secondary diagnosis:  Principal Problem:   S/P cesarean section Active Problems:   Supervision of other normal pregnancy, antepartum   Cesarean delivery delivered  Additional problems: none     Discharge diagnosis: Term Pregnancy Delivered and Anemia                                                                                                Post partum procedures:none  Augmentation: NA  Complications: None  Hospital course:  Scheduled C/S   23 y.o. yo [id number] at 5512w0d was admitted to the hospital 02/05/2018 for scheduled cesarean section with the following indication:Prior cesarean x 2.  Membrane Rupture Time/Date: 10:03 AM ,02/05/2018   Patient delivered a Viable infant.02/05/2018  Details of operation can be found in separate operative note.  Pateint had an uncomplicated postpartum course.  She is ambulating, tolerating a regular diet, passing flatus, and urinating well. Patient is discharged home in stable condition on  02/07/18 pending the discharge of her infant.         Magnesium Sulfate recieved: No BMZ received: No  Physical exam  Vitals:   02/06/18 0928 02/06/18 1615 02/06/18 2208 02/07/18 0621  BP: 97/60 98/66 118/70 94/75  Pulse: 60 72 98 71  Resp: 16 16  18   Temp: 97.8 F (36.6 C) 98.7 F (37.1 C) 98.2 F (36.8 C) 98 F (36.7 C)  TempSrc: Axillary Oral Oral Oral  SpO2: 100% 100%    Weight:      Height:       General: alert, cooperative and no distress Lochia: appropriate Uterine Fundus: firm Incision: Healing well with no significant drainage, No significant erythema, Dressing is clean, dry, and intact DVT Evaluation: No evidence of DVT seen on physical exam. Labs: Lab Results   Component Value Date   WBC 12.1 (H) 02/06/2018   HGB 9.7 (L) 02/06/2018   HCT 29.7 (L) 02/06/2018   MCV 83.4 02/06/2018   PLT 129 (L) 02/06/2018   CMP Latest Ref Rng & Units 02/05/2018  Glucose 65 - 99 mg/dL -  BUN 6 - 20 mg/dL -  Creatinine 0.340.44 - 7.421.00 mg/dL 5.95(G0.35(L)  Sodium 387135 - 564145 mmol/L -  Potassium 3.5 - 5.1 mmol/L -  Chloride 101 - 111 mmol/L -  CO2 22 - 32 mmol/L -  Calcium 8.9 - 10.3 mg/dL -  Total Protein 6.5 - 8.1 g/dL -  Total Bilirubin 0.3 - 1.2 mg/dL -  Alkaline Phos 38 - 332126 U/L -  AST 15 - 41 U/L -  ALT 14 - 54 U/L -    Discharge instruction: per After Visit Summary and "Baby and Me Booklet".  After visit meds:  Allergies as of 02/07/2018      Reactions   Penicillins Hives   Patient states that she is allergic to all penicillins. Has patient had a  PCN reaction causing immediate rash, facial/tongue/throat swelling, SOB or lightheadedness with hypotension: YES Has patient had a PCN reaction causing severe rash involving mucus membranes or skin necrosis: NO Has patient had a PCN reaction that required hospitalization NO Has patient had a PCN reaction occurring within the last 10 years:NO If all of the above answers are "NO", then may proceed with Cephalosporin use.      Medication List    TAKE these medications   ibuprofen 600 MG tablet Commonly known as:  ADVIL,MOTRIN Take 1 tablet (600 mg total) by mouth every 6 (six) hours as needed for mild pain or moderate pain.   oxyCODONE-acetaminophen 5-325 MG tablet Commonly known as:  PERCOCET/ROXICET Take 1-2 tablets by mouth every 4 (four) hours as needed for moderate pain.       Diet: routine diet  Activity: Advance as tolerated. Pelvic rest for 6 weeks.   Outpatient follow up:2 weeks for incision check, then 4 wks for PP visit Follow up Appt: Future Appointments  Date Time Provider Department Center  02/19/2018  1:00 PM Constant, Gigi GinPeggy, MD [facility] None  03/05/2018  9:30 AM Conan Bowensavis, Kelly M, MD [facility]  None   Follow up Visit: Please schedule this patient for Postpartum visit in: 6 weeks with the following provider: Any provider For C/S patients schedule nurse incision check in weeks 2 weeks: yes Low risk pregnancy complicated by: limited PNC, chlamydia affecting pregnancy Delivery mode:  CS Anticipated Birth Control:  Depo PP Procedures needed: Incision check  Schedule Integrated BH visit: no  Newborn Data: Live born female  Birth Weight:   APGAR: 8, 9  Newborn Delivery   Birth date/time:  02/05/2018 10:04:00 Delivery type:  C-Section, Low Transverse Trial of labor:  No C-section categorization:  Repeat     Baby Feeding: Breast Disposition:home with mother   02/07/2018 Arabella MerlesKimberly D Macayla Ekdahl, CNM  9:26 AM

## 2018-02-05 NOTE — Anesthesia Procedure Notes (Signed)
Spinal  Patient location during procedure: OR Start time: 02/05/2018 9:30 AM End time: 02/05/2018 9:33 AM Staffing Anesthesiologist: Cecile Hearing, MD Performed: anesthesiologist  Preanesthetic Checklist Completed: patient identified, surgical consent, pre-op evaluation, timeout performed, IV checked, risks and benefits discussed and monitors and equipment checked Spinal Block Patient position: sitting Prep: site prepped and draped and DuraPrep Patient monitoring: continuous pulse ox and blood pressure Approach: midline Location: L3-4 Injection technique: single-shot Needle Needle type: Pencan  Needle gauge: 24 G Assessment Sensory level: T4 Additional Notes Functioning IV was confirmed and monitors were applied. Sterile prep and drape, including hand hygiene, mask and sterile gloves were used. The patient was positioned and the spine was prepped. The skin was anesthetized with lidocaine.  Free flow of clear CSF was obtained prior to injecting local anesthetic into the CSF.  The spinal needle aspirated freely following injection.  The needle was carefully withdrawn.  The patient tolerated the procedure well. Consent was obtained prior to procedure with all questions answered and concerns addressed. Risks including but not limited to bleeding, infection, nerve damage, paralysis, failed block, inadequate analgesia, allergic reaction, high spinal, itching and headache were discussed and the patient wished to proceed.   Arrie Aran, MD

## 2018-02-05 NOTE — H&P (Signed)
Obstetric Preoperative History and Physical  Cindy Pearson is a 23 y.o. [id number] with IUP at [redacted]w[redacted]d presenting for scheduled cesarean section.  No acute concerns.   Prenatal Course Source of Care: [facility] with onset of care at 17 weeks, limited Pregnancy complications or risks: Patient Active Problem List   Diagnosis Date Noted  . Trichomoniasis 10/01/2017  . Supervision of other normal pregnancy, antepartum 09/03/2017  . Chlamydia 06/13/2016  . S/P cesarean section 05/26/2016   She plans to breastfeed She desires Depo-Provera for postpartum contraception.   Prenatal labs and studies: ABO, Rh: --/--/B POS (01/29 0955) Antibody: NEG (01/29 0955) Rubella: 2.00 (08/28 1425) RPR: Non Reactive (01/29 0955)  HBsAg: Negative (08/28 1425)  HIV: Non Reactive (08/28 1425)  GBS: negative HA1c: 5.0 Genetic screening normal Anatomy US normal  Prenatal Transfer Tool  Fetal Ultrasounds or other Referrals:  Referred to Materal Fetal Medicine  Maternal Substance Abuse:  No Significant Maternal Medications:  None Significant Maternal Lab Results: Lab values include: Other:   Past Medical History:  Diagnosis Date  . Asthma    Albuterol INH prn  . Blood transfusion without reported diagnosis   . Chlamydia 2012   hasn't picked up rx, not treated yet  . 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 Preterm labor 2011    Past Surgical History:  Procedure Laterality Date  . CESAREAN SECTION    . CESAREAN SECTION N/A 05/26/2016   Procedure: CESAREAN SECTION;  Surgeon: Lazaro Arms, MD;  Location: [facility] BIRTHING SUITES;  Service: Obstetrics;  Laterality: N/A;  . TONSILLECTOMY AND ADENOIDECTOMY      OB History  Gravida Para Term Preterm AB Living  5 3 3  0 1 3  SAB TAB Ectopic Multiple Live Births  1 0 0 0 3    # Outcome Date GA Lbr Len/2nd Weight Sex Delivery Anes PTL Lv  5 Current           4 Term 05/26/16 [redacted]w[redacted]d  2400 g M CS-LTranv Spinal  LIV  3 Term 03/12/11 [redacted]w[redacted]d  3215 g M VBAC EPI   LIV  2 Term 07/11/09 [redacted]w[redacted]d  3118 g F CS-LTranv EPI  LIV     Birth Comments: Failure to progress   1 SAB             Social History   Socioeconomic History  . Marital status: Single    Spouse name: Not on file  . Number of children: 3  . Years of education: Not on file  . Highest education level: Not on file  Occupational History  . Occupation: [employer]  Social Needs  . Financial resource strain: Not hard at all  . Food insecurity:    Worry: Never true    Inability: Never true  . Transportation needs:    Medical: No    Non-medical: Not on file  Tobacco Use  . Smoking status: Former Smoker    Last attempt to quit: 08/20/2017    Years since quitting: 0.4  . Smokeless tobacco: Never Used  Substance and Sexual Activity  . Alcohol use: Not Currently    Comment: occasional  . Drug use: Yes    Types: Marijuana    Comment: last smoked in September 2019  . Sexual activity: Not Currently    Partners: Female    Birth control/protection: None    Comment: 1 partner  Lifestyle  . Physical activity:    Days per week: Not on file  Minutes per session: Not on file  . Stress: Only a little  Relationships  . Social connections:    Talks on phone: Not on file    Gets together: Not on file    Attends religious service: Not on file    Active member of club or organization: Not on file    Attends meetings of clubs or organizations: Not on file    Relationship status: Not on file  Other Topics Concern  . Not on file  Social History Narrative  . Not on file    Family History  Problem Relation Age of Onset  . Diabetes Maternal Grandmother   . Heart disease Maternal Grandfather   . Cancer Maternal Grandfather   . Heart disease Paternal Grandfather   . Cancer Father     Medications Prior to Admission  Medication Sig Dispense Refill Last Dose  . acetaminophen (TYLENOL) 500 MG tablet Take 500-1,000 mg by mouth every 6 (six) hours as needed for headache.   Not Taking    . tinidazole (TINDAMAX) 500 MG tablet Take 2 tablets (1,000 mg total) by mouth daily with breakfast. 10 tablet 2     Allergies  Allergen Reactions  . Penicillins Hives    Patient states that she is allergic to all penicillins. Has patient had a PCN reaction causing immediate rash, facial/tongue/throat swelling, SOB or lightheadedness with hypotension: YES Has patient had a PCN reaction causing severe rash involving mucus membranes or skin necrosis: NO Has patient had a PCN reaction that required hospitalization NO Has patient had a PCN reaction occurring within the last 10 years:NO If all of the above answers are "NO", then may proceed with Cephalosporin use.    Review of Systems: Negative except for what is mentioned in HPI.  Physical Exam: BP 112/66   Pulse 87   Temp 98.2 F (36.8 C) (Oral)   Resp 18   Ht 5\' 1"  (1.549 m)   Wt 72.8 kg   LMP 05/03/2017 (Approximate)   SpO2 96%   BMI 30.31 kg/m  FHR by Doppler: 135 bpm CONSTITUTIONAL: Well-developed, well-nourished female in no acute distress.  HENT:  Normocephalic, atraumatic, External right and left ear normal. Oropharynx is clear and moist EYES: Conjunctivae and EOM are normal. Pupils are equal, round, and reactive to light. No scleral icterus.  NECK: Normal range of motion, supple, no masses SKIN: Skin is warm and dry. No rash noted. Not diaphoretic. No erythema. No pallor. NEUROLGIC: Alert and oriented to person, place, and time. Normal reflexes, muscle tone coordination. No cranial nerve deficit noted. PSYCHIATRIC: Normal mood and affect. Normal behavior. Normal judgment and thought content. CARDIOVASCULAR: Normal heart rate noted, regular rhythm RESPIRATORY: Effort and breath sounds normal, no problems with respiration noted ABDOMEN: Soft, nontender, nondistended, gravid. Well-healed Pfannenstiel incision. PELVIC: Deferred MUSCULOSKELETAL: Normal range of motion. No edema and no tenderness. 2+ distal  pulses.   Pertinent Labs/Studies:   Results for orders placed or performed during the hospital encounter of 02/04/18 (from the past 72 hour(s))  CBC     Status: Abnormal   Collection Time: 02/04/18  9:55 AM  Result Value Ref Range   WBC 6.8 4.0 - 10.5 K/uL   RBC 4.02 3.87 - 5.11 MIL/uL   Hemoglobin 10.8 (L) 12.0 - 15.0 g/dL   HCT 80.3 (L) 21.2 - 24.8 %   MCV 83.1 80.0 - 100.0 fL   MCH 26.9 26.0 - 34.0 pg   MCHC 32.3 30.0 - 36.0 g/dL   RDW  13.8 11.5 - 15.5 %   Platelets 146 (L) 150 - 400 K/uL   nRBC 0.0 0.0 - 0.2 %    Comment: Performed at [facility], [address] [place], [place] [address]  RPR     Status: None   Collection Time: 02/04/18  9:55 AM  Result Value Ref Range   RPR Ser Ql Non Reactive Non Reactive    Comment: (NOTE) Performed At: [facility] [address] [place], [place] [id number] Jolene SchimkeNagendra Sanjai MD XB:[id number]:[phone]   Type and screen [facility]     Status: None   Collection Time: 02/04/18  9:55 AM  Result Value Ref Range   ABO/RH(D) B POS    Antibody Screen NEG    Sample Expiration      02/07/2018 Performed at [facility], [address] [place], [place] [address]     Assessment and Plan :[name] is a 23 y.o. [id number] at 3059w0d being admitted for scheduled repeat cesarean section. Feeling well today, diagnosed with chlamydia two weeks ago (had initial positive test with NOB then neg TOC), was unaware of new diagnosis and has not been treated. Will treat with azithromycin, peds aware.   The risks of cesarean section were discussed with the patient; including but not limited to: infection which may require antibiotics; bleeding which may require transfusion or re-operation; injury to bowel, bladder, ureters or other surrounding organs; need for additional procedures including hysterectomy in the event of a life-threatening hemorrhage; placental abnormalities wth subsequent pregnancies, risk of needing c-sections in  future pregnancies, incisional problems, thromboembolic phenomenon and other postoperative/anesthesia complications. Answered all questions. The patient verbalized understanding of the plan, giving informed consent for the procedure. She is agreeable to blood transfusion in the event of emergency.  Patient has been NPO since last night, she will remain NPO for procedure Anesthesia and OR aware Preoperative prophylactic antibiotics and SCDs ordered on call to the OR  To OR when ready   K. Therese SarahMeryl , M.D. Attending Obstetrician & Gynecologist, [facility] for [facility], [facility]

## 2018-02-06 LAB — CBC
HCT: 29.7 % — ABNORMAL LOW (ref 36.0–46.0)
Hemoglobin: 9.7 g/dL — ABNORMAL LOW (ref 12.0–15.0)
MCH: 27.2 pg (ref 26.0–34.0)
MCHC: 32.7 g/dL (ref 30.0–36.0)
MCV: 83.4 fL (ref 80.0–100.0)
NRBC: 0 % (ref 0.0–0.2)
Platelets: 129 10*3/uL — ABNORMAL LOW (ref 150–400)
RBC: 3.56 MIL/uL — AB (ref 3.87–5.11)
RDW: 13.7 % (ref 11.5–15.5)
WBC: 12.1 10*3/uL — ABNORMAL HIGH (ref 4.0–10.5)

## 2018-02-06 NOTE — Progress Notes (Signed)
Post Partum/Op Day 1  Subjective: up ad lib, voiding and tolerating PO. She is not yet passing gas but is otherwise eating, drinking, peeing, and ambulating well.   Objective: Blood pressure (!) 94/59, pulse (!) 56, temperature 98.1 F (36.7 C), temperature source Oral, resp. rate 18, height 5\' 1"  (1.549 m), weight 72.8 kg, last menstrual period 05/03/2017, SpO2 99 %, unknown if currently breastfeeding.  Physical Exam:  General: alert, cooperative and no distress Lochia: appropriate Uterine Fundus: firm Incision: healing well, no dehiscence, honeycomb appears 50% saturated DVT Evaluation: No evidence of DVT seen on physical exam. No cords or calf tenderness. No significant calf/ankle edema.  Recent Labs    02/05/18 1430 02/06/18 0513  HGB 10.8* 9.7*  HCT 33.4* 29.7*    Assessment/Plan: Breastfeeding and Contraception Depo Patient would like her honeycomb dressing changed. Plan to discharge Feb 1 or 2nd.  LOS: 1 day   Dollene Cleveland 02/06/2018, 8:10 AM

## 2018-02-06 NOTE — Progress Notes (Signed)
CSW acknowledges consult. CSW attempted to meet with MOB, however WIC was present with MOB.  CSW will attempt to visit with MOB at a later time.   Celso Sickle, LCSWA Clinical Social Worker [facility] Cell#: [phone]

## 2018-02-06 NOTE — Lactation Note (Addendum)
This note was copied from a baby's chart. Lactation Consultation Note  Patient Name: Girl Indyah Giliberto [id number] Date: 02/06/2018  baby girl Duwayne Heck now 40 hours old.  Has had no stools yet today and only 1 void. Six percent weight loss .  Mom reports she feels she is breastfeeding well. Mom reports she has been more sleepy today and breastfed more  Yesterday.  Assist with breastfeeding on right breast in cradle hold.  Urged mom to post pump and hand express and feed back all expressed mothers milk past her feeding.  Urged mom to do some breast compression while breastfeeding also to help her get more milk.  Mom reports all of her babies were small when they were born. Urged mom to offer the breast every 2-3 hours and on cue and pump past that two-to three hour feed.  Urged mom to follow up with lactation as needed.  Maternal Data    Feeding Feeding Type: Breast Milk  LATCH Score Latch: Grasps breast easily, tongue down, lips flanged, rhythmical sucking.  Audible Swallowing: Spontaneous and intermittent  Type of Nipple: Everted at rest and after stimulation  Comfort (Breast/Nipple): Soft / non-tender  Hold (Positioning): No assistance needed to correctly position infant at breast.  LATCH Score: 10  Interventions    Lactation Tools Discussed/Used     Consult Status      Lenell Lama Michaelle Copas 02/06/2018, 7:01 PM

## 2018-02-06 NOTE — Addendum Note (Signed)
Addendum  created 02/06/18 0847 by Elgie Congo, CRNA   Clinical Note Signed

## 2018-02-06 NOTE — Anesthesia Postprocedure Evaluation (Signed)
Anesthesia Post Note  Patient: Cindy Pearson  Procedure(s) Performed: CESAREAN SECTION (N/A Abdomen)     Patient location during evaluation: Mother Baby Anesthesia Type: Spinal Level of consciousness: awake and alert Pain management: pain level controlled Vital Signs Assessment: post-procedure vital signs reviewed and stable Respiratory status: spontaneous breathing, nonlabored ventilation and respiratory function stable Cardiovascular status: stable Postop Assessment: no headache, no backache, no apparent nausea or vomiting, patient able to bend at knees, spinal receding, adequate PO intake and able to ambulate Anesthetic complications: no    Last Vitals:  Vitals:   02/06/18 0120 02/06/18 0522  BP: (!) 99/50 (!) 94/59  Pulse: 66 (!) 56  Resp: 18 18  Temp: 36.7 C 36.7 C  SpO2:      Last Pain:  Vitals:   02/06/18 0522  TempSrc: Oral  PainSc: Asleep   Pain Goal:                   Laban Emperor

## 2018-02-06 NOTE — Plan of Care (Signed)
Progressing appropriately. Encouraged to call for assistance as needed, and for LATCH assessment.  

## 2018-02-07 MED ORDER — IBUPROFEN 600 MG PO TABS: 600.0000 mg | ORAL_TABLET | Freq: Four times a day (QID) | ORAL | 0 refills | Status: DC | PRN

## 2018-02-07 MED ORDER — OXYCODONE-ACETAMINOPHEN 5-325 MG PO TABS: 1.0000 | ORAL_TABLET | ORAL | 0 refills | Status: DC | PRN

## 2018-02-07 NOTE — Progress Notes (Signed)
CLINICAL SOCIAL WORK MATERNAL/CHILD NOTE  Patient Details  Name: Cindy Pearson MRN: [id number] Date of Birth: 02/05/2019  Date:  02/07/2018  Clinical Social Worker Initiating Note:  Bristol Osentoski, LCSWA Date/Time: Initiated:  02/07/18/1027     Child's Name:  Cindy Pearson   Biological Parents:  Mother   Need for Interpreter:  None   Reason for Referral:  Current Substance Use/Substance Use During Pregnancy    Address:  [address] [place], [place] [address]   Phone number:  [phone]    Additional phone number:   Household Members/Support Persons (HM/SP):   Household Member/Support Person 1, Household Member/Support Person 2   HM/SP Name Relationship DOB or Age  HM/SP -1   cousin    HM/SP -2 Cindy Pearson son 05/26/16  HM/SP -3        HM/SP -4        HM/SP -5        HM/SP -6        HM/SP -7        HM/SP -8          Natural Supports (not living in the home):  Parent, Extended Family   Professional Supports: None   Employment:     Type of Work:     Education:      Homebound arranged:    Financial Resources:  Medicaid   Other Resources:  WIC, Food Stamps    Cultural/Religious Considerations Which May Impact Care:    Strengths:  Ability to meet basic needs , Home prepared for child , Pediatrician chosen   Psychotropic Medications:         Pediatrician:    [place] area  Pediatrician List:   [facility]  [place]    [place]    [place]    [place]    [place]      Pediatrician Fax Number:    Risk Factors/Current Problems:  Substance Use    Cognitive State:  Able to Concentrate , Alert , Linear Thinking , Goal Oriented    Mood/Affect:  Calm , Interested , Relaxed    CSW Assessment: CSW met with MOB at bedside to discuss consult for substance use. CSW introduced self and explained reason for consult. MOB was quiet but answered all questions asked by CSW. MOB  reported that she currently resides with her cousin and son while she is waiting on her home to get ready. MOB reported that her daughter (Cindy Pearson 07/11/99) is staying with her mother (Cindy Pearson) until her home is ready. MOB denied CPS involvement with her daughter staying with her mother, noting they were all staying with her mother before she moved in with her cousin. MOB reported that her daughter will move in with her when her home is ready. MOB reported that she receives both WIC and Food Stamps and has all items needed to care for infant at home. CSW asked MOB if FOB was involved, MOB reported no. MOB reported that she doesn't know his government name. CSW inquired about MOB's support system, MOB reported that her mom, cousin and a few other family members are her supports.   CSW inquired about MOB's mental health history, MOB reported that she was diagnosed with anxiety and depression 9 years ago. MOB reported that she has not taken medication for either mental health diagnoses. MOB reported that she did some therapy last year and it was helpful. CSW asked MOB if she   was interested in therapy resources, MOB replied yes. CSW provided MOB with therapy resources and encouraged her to follow up. MOB denied any current depressive symptoms but reported that she has some anxiety symptoms. CSW and MOB discussed coping skills. MOB reported that she remains focused on her kids for motivation to do what she needs to do. MOB denied any post partum depression history. MOB presented calm and quiet. MOB did not demonstrate any acute mental health signs/symptoms. CSW assessed for safety, MOB denied SI, HI and domestic violence.   CSW provided education regarding the baby blues period vs. perinatal mood disorders, discussed treatment and gave resources for mental health follow up if concerns arise.  CSW recommends self-evaluation during the postpartum time period using the New Mom Checklist from Postpartum  Progress and encouraged MOB to contact a medical professional if symptoms are noted at any time.    CSW provided review of Sudden Infant Death Syndrome (SIDS) precautions.    CSW informed MOB about hospital drug policy and inquired about MOB's substance use during pregnancy. MOB reported that she smoked marijuana during pregnancy to help with her appetite. MOB reported that she got sick a lot during her pregnancy and that her last use was a few weeks ago. CSW informed MOB that UDS was still pending and that UDS and CDS would be continue to be monitored. CSW explained that a CPS report would be made if warranted. MOB reported that she went through this last year after having her son and that her CPS case was opened and closed. MOB denied any open CPS cases.   CSW will continue to monitor UDS and CDS and make CPS report if warranted.   CSW identifies no further need for intervention and no barriers to discharge at this time.  CSW Plan/Description:  No Further Intervention Required/No Barriers to Discharge, Sudden Infant Death Syndrome (SIDS) Education, Perinatal Mood and Anxiety Disorder (PMADs) Education, Hospital Drug Screen Policy Information, CSW Will Continue to Monitor Umbilical Cord Tissue Drug Screen Results and Make Report if Warranted    Estefanny Moler L Laysa Kimmey, LCSW 02/07/2018, 10:33 AM  

## 2018-02-07 NOTE — Lactation Note (Signed)
This note was copied from a baby's chart. Lactation Consultation Note:  Mother breastfeeding on the Rt breast.  Observed good depth with rhythmic suckling.  Infant released the breast. Mother denies having any nipple discomfort. Mothers breast are full.    Mother has post pumped 40 ml. Mother offered 20 ml ebm with a bottle.  Encouraged mother to continue to post pump.  Mother is using DEBP and has a harmony hand pump for home use.  Mother declines early discharge. Encouraged to continue to cue base feed at least 8-12 times or more in 24 hours.  Discussed cluster feeding.     Patient Name: Cindy Pearson ELYHT'M Date: 02/07/2018 Reason for consult: Follow-up assessment   Maternal Data    Feeding Feeding Type: Breast Milk  LATCH Score                   Interventions Interventions: Skin to skin;Breast massage;Hand express;Breast compression;DEBP  Lactation Tools Discussed/Used     Consult Status Consult Status: Follow-up Date: 02/07/18 Follow-up type: In-patient    Stevan Born [facility] 02/07/2018, 11:35 AM

## 2018-02-07 NOTE — Discharge Instructions (Signed)

## 2018-02-08 ENCOUNTER — Ambulatory Visit: Payer: Self-pay

## 2018-02-08 NOTE — Lactation Note (Signed)
This note was copied from a baby's chart. Lactation Consultation Note:  Mother reports that she is not feeling well this am.  She ate something that made her nauseated.  She has pumped her breast frequently this am.   Mother reports that she plans to use the harmony hand pump  As well as she plans to get her pump from [facility] next week.   Mother has a full bottle of ebm sitting at the bedside for the next feeding.   Suggested to continue to cue base feed infant and allow infant to drain breast well.  Encouraged to feed infant 8-12 times or more in 24 hours.   Mother has a 1 yr old at home and reports having good support of family.   Discussed treatment and prevention of engorgement.   Mother is aware of available LC's at [facility] for follow up as needed as well as community support.  Mother very receptive to all teaching.   Patient Name: Girl Lashasta Cruickshank JGGEZ'M Date: 02/08/2018     Maternal Data    Feeding    LATCH Score                   Interventions    Lactation Tools Discussed/Used     Consult Status      Stevan Born [facility] 02/08/2018, 1:45 PM

## 2018-02-08 NOTE — Discharge Summary (Signed)
Postpartum Discharge Summary     Patient Name: Cindy Pearson DOB: 1995-04-30 MRN: [id number]  Date of admission: 02/05/2018 Delivering Provider: Conan Bowens   Date of discharge: 02/08/2018  Admitting diagnosis: previous cesarean section Intrauterine pregnancy: [redacted]w[redacted]d     Secondary diagnosis:  Principal Problem:   S/P cesarean section Active Problems:   Supervision of other normal pregnancy, antepartum   Cesarean delivery delivered  Additional problems: none     Discharge diagnosis: Term Pregnancy Delivered and Anemia                                                                                                Post partum procedures:none  Augmentation: NA  Complications: None  Hospital course:  Scheduled C/S   23 y.o. yo [id number] at [redacted]w[redacted]d was admitted to the hospital 02/05/2018 for scheduled cesarean section with the following indication:Prior cesarean x 2.  Membrane Rupture Time/Date: 10:03 AM ,02/05/2018   Patient delivered a Viable infant.02/05/2018  Details of operation can be found in separate operative note.  Pateint had an uncomplicated postpartum course.  She is ambulating, tolerating a regular diet, passing flatus, and urinating well. Patient is discharged home in stable condition on  02/08/18 pending the discharge of her infant.         Magnesium Sulfate recieved: No BMZ received: No  Physical exam  Vitals:   02/06/18 2208 02/07/18 0621 02/07/18 1426 02/08/18 0556  BP:  94/75 111/70 104/68  Pulse: 98 71 79 77  Resp:  18 17 18   Temp: 98.2 F (36.8 C) 98 F (36.7 C) 98.6 F (37 C) 98.4 F (36.9 C)  TempSrc: Oral Oral Oral Oral  SpO2:   100%   Weight:      Height:       General: alert, cooperative and no distress Lochia: appropriate Uterine Fundus: firm Incision: Healing well with no significant drainage, No significant erythema, Dressing is clean, dry, and intact DVT Evaluation: No evidence of DVT seen on physical exam. Labs: Lab Results  Component  Value Date   WBC 12.1 (H) 02/06/2018   HGB 9.7 (L) 02/06/2018   HCT 29.7 (L) 02/06/2018   MCV 83.4 02/06/2018   PLT 129 (L) 02/06/2018   CMP Latest Ref Rng & Units 02/05/2018  Glucose 65 - 99 mg/dL -  BUN 6 - 20 mg/dL -  Creatinine 8.88 - 9.16 mg/dL 9.45(W)  Sodium 388 - 828 mmol/L -  Potassium 3.5 - 5.1 mmol/L -  Chloride 101 - 111 mmol/L -  CO2 22 - 32 mmol/L -  Calcium 8.9 - 10.3 mg/dL -  Total Protein 6.5 - 8.1 g/dL -  Total Bilirubin 0.3 - 1.2 mg/dL -  Alkaline Phos 38 - 003 U/L -  AST 15 - 41 U/L -  ALT 14 - 54 U/L -    Discharge instruction: per After Visit Summary and "Baby and Me Booklet".  After visit meds:  Allergies as of 02/08/2018      Reactions   Penicillins Hives   Patient states that she is allergic to all penicillins. Has patient had a  PCN reaction causing immediate rash, facial/tongue/throat swelling, SOB or lightheadedness with hypotension: YES Has patient had a PCN reaction causing severe rash involving mucus membranes or skin necrosis: NO Has patient had a PCN reaction that required hospitalization NO Has patient had a PCN reaction occurring within the last 10 years:NO If all of the above answers are "NO", then may proceed with Cephalosporin use.      Medication List    TAKE these medications   ibuprofen 600 MG tablet Commonly known as:  ADVIL,MOTRIN Take 1 tablet (600 mg total) by mouth every 6 (six) hours as needed for mild pain or moderate pain.   oxyCODONE-acetaminophen 5-325 MG tablet Commonly known as:  PERCOCET/ROXICET Take 1-2 tablets by mouth every 4 (four) hours as needed for moderate pain.       Diet: routine diet  Activity: Advance as tolerated. Pelvic rest for 6 weeks.   Outpatient follow up:2 weeks for incision check, then 4 wks for PP visit Follow up Appt: Future Appointments  Date Time Provider Department Center  02/19/2018  1:00 PM Constant, Gigi GinPeggy, MD [facility] None  03/05/2018  9:30 AM Conan Bowensavis, Kelly M, MD [facility] None    Follow up Visit: Please schedule this patient for Postpartum visit in: 6 weeks with the following provider: Any provider For C/S patients schedule nurse incision check in weeks 2 weeks: yes Low risk pregnancy complicated by: limited PNC, chlamydia affecting pregnancy Delivery mode:  CS Anticipated Birth Control:  Depo PP Procedures needed: Incision check  Schedule Integrated BH visit: no  Newborn Data: Live born female  Birth Weight:   APGAR: 8, 9  Newborn Delivery   Birth date/time:  02/[birth year]/2018 10:04:00 Delivery type:  C-Section, Low Transverse Trial of labor:  No C-section categorization:  Repeat     Baby Feeding: Breast Disposition:home with mother   02/08/2018 Gwenevere AbbotNimeka Dysen Edmondson, MD  8:03 AM

## 2018-02-13 ENCOUNTER — Encounter (HOSPITAL_COMMUNITY): Payer: Self-pay | Admitting: *Deleted

## 2018-02-13 ENCOUNTER — Observation Stay (HOSPITAL_COMMUNITY)
Admission: AD | Admit: 2018-02-13 | Discharge: 2018-02-14 | Disposition: A | Discharge status: Home / Self Care | Payer: Medicaid Other | Attending: Obstetrics & Gynecology | Admitting: Obstetrics & Gynecology

## 2018-02-13 ENCOUNTER — Other Ambulatory Visit: Payer: Self-pay

## 2018-02-13 DIAGNOSIS — Z833 Family history of diabetes mellitus: Secondary | ICD-10-CM | POA: Diagnosis not present

## 2018-02-13 DIAGNOSIS — Z79899 Other long term (current) drug therapy: Secondary | ICD-10-CM | POA: Diagnosis not present

## 2018-02-13 DIAGNOSIS — K5900 Constipation, unspecified: Secondary | ICD-10-CM | POA: Insufficient documentation

## 2018-02-13 DIAGNOSIS — Z791 Long term (current) use of non-steroidal anti-inflammatories (NSAID): Secondary | ICD-10-CM | POA: Insufficient documentation

## 2018-02-13 DIAGNOSIS — Z880 Allergy status to penicillin: Secondary | ICD-10-CM | POA: Diagnosis not present

## 2018-02-13 DIAGNOSIS — J45909 Unspecified asthma, uncomplicated: Secondary | ICD-10-CM | POA: Insufficient documentation

## 2018-02-13 DIAGNOSIS — O1415 Severe pre-eclampsia, complicating the puerperium: Secondary | ICD-10-CM | POA: Diagnosis not present

## 2018-02-13 DIAGNOSIS — Z8249 Family history of ischemic heart disease and other diseases of the circulatory system: Secondary | ICD-10-CM | POA: Insufficient documentation

## 2018-02-13 DIAGNOSIS — Z98891 History of uterine scar from previous surgery: Secondary | ICD-10-CM

## 2018-02-13 DIAGNOSIS — F17200 Nicotine dependence, unspecified, uncomplicated: Secondary | ICD-10-CM | POA: Insufficient documentation

## 2018-02-13 LAB — RAPID URINE DRUG SCREEN, HOSP PERFORMED
Amphetamines: NOT DETECTED
BENZODIAZEPINES: NOT DETECTED
Barbiturates: NOT DETECTED
Cocaine: NOT DETECTED
Opiates: NOT DETECTED
Tetrahydrocannabinol: NOT DETECTED

## 2018-02-13 LAB — COMPREHENSIVE METABOLIC PANEL
ALT: 17 U/L (ref 0–44)
AST: 15 U/L (ref 15–41)
Albumin: 3.2 g/dL — ABNORMAL LOW (ref 3.5–5.0)
Alkaline Phosphatase: 73 U/L (ref 38–126)
Anion gap: 6 (ref 5–15)
BUN: 10 mg/dL (ref 6–20)
CO2: 26 mmol/L (ref 22–32)
Calcium: 8.7 mg/dL — ABNORMAL LOW (ref 8.9–10.3)
Chloride: 106 mmol/L (ref 98–111)
Creatinine, Ser: 0.56 mg/dL (ref 0.44–1.00)
GFR calc Af Amer: >60 mL/min (ref >60)
GFR calc non Af Amer: >60 mL/min (ref >60)
Glucose, Bld: 78 mg/dL (ref 70–99)
Potassium: 3.8 mmol/L (ref 3.5–5.1)
Sodium: 138 mmol/L (ref 135–145)
Total Bilirubin: 0.8 mg/dL (ref 0.3–1.2)
Total Protein: 6.7 g/dL (ref 6.5–8.1)

## 2018-02-13 LAB — URINALYSIS, MICROSCOPIC (REFLEX)

## 2018-02-13 LAB — CBC
HCT: 34.9 % — ABNORMAL LOW (ref 36.0–46.0)
Hemoglobin: 11.1 g/dL — ABNORMAL LOW (ref 12.0–15.0)
MCH: 26.5 pg (ref 26.0–34.0)
MCHC: 31.8 g/dL (ref 30.0–36.0)
MCV: 83.3 fL (ref 80.0–100.0)
Platelets: 202 10*3/uL (ref 150–400)
RBC: 4.19 MIL/uL (ref 3.87–5.11)
RDW: 14 % (ref 11.5–15.5)
WBC: 5.3 10*3/uL (ref 4.0–10.5)
nRBC: 0 % (ref 0.0–0.2)

## 2018-02-13 LAB — URINALYSIS, ROUTINE W REFLEX MICROSCOPIC
Bilirubin Urine: NEGATIVE
Glucose, UA: NEGATIVE mg/dL
Ketones, ur: NEGATIVE mg/dL
Nitrite: NEGATIVE
Protein, ur: NEGATIVE mg/dL
Specific Gravity, Urine: 1.015 (ref 1.005–1.030)
pH: 7 (ref 5.0–8.0)

## 2018-02-13 LAB — WET PREP, GENITAL
CLUE CELLS WET PREP: NONE SEEN
Sperm: NONE SEEN
Trich, Wet Prep: NONE SEEN
Yeast Wet Prep HPF POC: NONE SEEN

## 2018-02-13 MED ORDER — BISACODYL 5 MG PO TBEC
5.0000 mg | DELAYED_RELEASE_TABLET | Freq: Every day | ORAL | Status: DC | PRN
  Filled 2018-02-13: qty 1

## 2018-02-13 MED ORDER — HYDROMORPHONE HCL 1 MG/ML IJ SOLN
0.2000 mg | INTRAMUSCULAR | Status: DC | PRN
  Administered 2018-02-13: 0.6 mg via INTRAVENOUS
  Filled 2018-02-13: qty 1

## 2018-02-13 MED ORDER — ALUM & MAG HYDROXIDE-SIMETH 200-200-20 MG/5ML PO SUSP
30.0000 mL | Freq: Once | ORAL | Status: AC
  Administered 2018-02-13: 30 mL via ORAL
  Filled 2018-02-13: qty 30

## 2018-02-13 MED ORDER — OXYCODONE-ACETAMINOPHEN 5-325 MG PO TABS
2.0000 | ORAL_TABLET | Freq: Once | ORAL | Status: AC
  Administered 2018-02-13: 2 via ORAL
  Filled 2018-02-13: qty 2

## 2018-02-13 MED ORDER — NIFEDIPINE ER OSMOTIC RELEASE 30 MG PO TB24
30.0000 mg | ORAL_TABLET | Freq: Once | ORAL | Status: AC
  Administered 2018-02-13: 30 mg via ORAL
  Filled 2018-02-13: qty 1

## 2018-02-13 MED ORDER — ONDANSETRON HCL 4 MG PO TABS: 4.0000 mg | ORAL_TABLET | Freq: Four times a day (QID) | ORAL | Status: DC | PRN

## 2018-02-13 MED ORDER — MAGNESIUM SULFATE BOLUS VIA INFUSION
4.0000 g | Freq: Once | INTRAVENOUS | Status: AC
  Administered 2018-02-13: 4 g via INTRAVENOUS
  Filled 2018-02-13: qty 500

## 2018-02-13 MED ORDER — OXYCODONE-ACETAMINOPHEN 5-325 MG PO TABS
1.0000 | ORAL_TABLET | ORAL | Status: DC | PRN
  Administered 2018-02-13: 2 via ORAL
  Filled 2018-02-13: qty 2

## 2018-02-13 MED ORDER — LIDOCAINE VISCOUS HCL 2 % MT SOLN
15.0000 mL | Freq: Once | OROMUCOSAL | Status: AC
  Administered 2018-02-13: 15 mL via ORAL
  Filled 2018-02-13: qty 15

## 2018-02-13 MED ORDER — MAGNESIUM SULFATE 40 G IN LACTATED RINGERS - SIMPLE
2.0000 g/h | INTRAVENOUS | Status: AC
  Administered 2018-02-13: 2 g/h via INTRAVENOUS
  Filled 2018-02-13: qty 500

## 2018-02-13 MED ORDER — LABETALOL HCL 5 MG/ML IV SOLN: 20.0000 mg | INTRAVENOUS | Status: DC | PRN

## 2018-02-13 MED ORDER — MAGNESIUM CITRATE PO SOLN: 1.0000 | Freq: Once | ORAL | Status: DC | PRN

## 2018-02-13 MED ORDER — HYDRALAZINE HCL 20 MG/ML IJ SOLN: 10.0000 mg | INTRAMUSCULAR | Status: DC | PRN

## 2018-02-13 MED ORDER — LABETALOL HCL 5 MG/ML IV SOLN: 40.0000 mg | INTRAVENOUS | Status: DC | PRN

## 2018-02-13 MED ORDER — LACTATED RINGERS IV SOLN
INTRAVENOUS | Status: DC
  Administered 2018-02-13 (×2): via INTRAVENOUS

## 2018-02-13 MED ORDER — IBUPROFEN 600 MG PO TABS
600.0000 mg | ORAL_TABLET | Freq: Four times a day (QID) | ORAL | Status: DC | PRN
  Administered 2018-02-13: 600 mg via ORAL
  Filled 2018-02-13: qty 1

## 2018-02-13 MED ORDER — MAGNESIUM HYDROXIDE 400 MG/5ML PO SUSP: 30.0000 mL | Freq: Every day | ORAL | Status: DC | PRN

## 2018-02-13 MED ORDER — ZOLPIDEM TARTRATE 5 MG PO TABS: 5.0000 mg | ORAL_TABLET | Freq: Every evening | ORAL | Status: DC | PRN

## 2018-02-13 MED ORDER — ONDANSETRON HCL 4 MG/2ML IJ SOLN: 4.0000 mg | Freq: Four times a day (QID) | INTRAMUSCULAR | Status: DC | PRN

## 2018-02-13 MED ORDER — DOCUSATE SODIUM 100 MG PO CAPS
100.0000 mg | ORAL_CAPSULE | Freq: Two times a day (BID) | ORAL | Status: DC
  Administered 2018-02-13 – 2018-02-14 (×2): 100 mg via ORAL
  Filled 2018-02-13 (×2): qty 1

## 2018-02-13 MED ORDER — ALUM & MAG HYDROXIDE-SIMETH 200-200-20 MG/5ML PO SUSP: 30.0000 mL | ORAL | Status: DC | PRN

## 2018-02-13 MED ORDER — HYDRALAZINE HCL 20 MG/ML IJ SOLN: 5.0000 mg | INTRAMUSCULAR | Status: DC | PRN

## 2018-02-13 MED ORDER — ENALAPRIL MALEATE 10 MG PO TABS
10.0000 mg | ORAL_TABLET | Freq: Every day | ORAL | Status: DC
  Administered 2018-02-13 – 2018-02-14 (×2): 10 mg via ORAL
  Filled 2018-02-13 (×3): qty 1

## 2018-02-13 NOTE — MAU Note (Signed)
Pt had her 3rd c/s on 02/05/18 and started getting new onset low back and abdominal pain that started 2/4.  Rates pain 7-8/10, but has not taken any medications for pain today.  Reports bleeding had stopped when she got home, but started having some small clots and bleeding yesterday.  Reports she sometimes has chest pain when she lays down, but gets better when she sits up.  No chest pain now.

## 2018-02-13 NOTE — H&P (Signed)
History    CSN: [id number]   Arrival date and time: 02/13/18 1233    First Provider Initiated Contact with Patient 02/13/18 1407          Chief Complaint  Patient presents with  . Abdominal Pain  . Hypertension    Cindy Pearson is a 23 y.o. [phone] female s/p 3rd CS 7 days ago presenting with new onset pelvic and low back pain, and increased dark brown bleeding. She had some chest pain previously, but none at this time. She also has a mild headache and elevated blood pressures (encouraged to come in by home RN). She has not been taking her pain meds due to constipation, and is concerned she has endometritis (she got it after her last delivery).             OB History     Gravida  5   Para  4   Term  4   Preterm  0   AB  1   Living  4      SAB  1   TAB  0   Ectopic  0   Multiple  0   Live Births  4                   Past Medical History:  Diagnosis Date  . Asthma      Albuterol INH prn  . Blood transfusion without reported diagnosis    . Chlamydia 2012    hasn't picked up rx, not treated yet  . Herpes      last outbreak 3 months ago  . Preterm labor 2011           Past Surgical History:  Procedure Laterality Date  . CESAREAN SECTION      . CESAREAN SECTION N/A 05/26/2016    Procedure: CESAREAN SECTION;  Surgeon: Lazaro Arms, MD;  Location: [facility] BIRTHING SUITES;  Service: Obstetrics;  Laterality: N/A;  . CESAREAN SECTION N/A 02/05/2018    Procedure: CESAREAN SECTION;  Surgeon: Conan Bowens, MD;  Location: [facility] BIRTHING SUITES;  Service: Obstetrics;  Laterality: N/A;  . TONSILLECTOMY AND ADENOIDECTOMY               Family History  Problem Relation Age of Onset  . Diabetes Maternal Grandmother    . Heart disease Maternal Grandfather    . Cancer Maternal Grandfather    . Heart disease Paternal Grandfather    . Cancer Father        Social History         Tobacco Use  . Smoking status: Current Every Day Smoker  . Smokeless tobacco: Never  Used  . Tobacco comment: started again 02/11/2018  Substance Use Topics  . Alcohol use: Not Currently      Comment: occasional  . Drug use: Yes      Types: Marijuana      Comment: last smoked in September 2019      Allergies:       Allergies  Allergen Reactions  . Penicillins Hives      Patient states that she is allergic to all penicillins. Has patient had a PCN reaction causing immediate rash, facial/tongue/throat swelling, SOB or lightheadedness with hypotension: YES Has patient had a PCN reaction causing severe rash involving mucus membranes or skin necrosis: NO Has patient had a PCN reaction that required hospitalization NO Has patient had a PCN reaction occurring within the last 10 years:NO If all of the  above answers are "NO", then may proceed with Cephalosporin use.             Medications Prior to Admission  Medication Sig Dispense Refill Last Dose  . ibuprofen (ADVIL,MOTRIN) 600 MG tablet Take 1 tablet (600 mg total) by mouth every 6 (six) hours as needed for mild pain or moderate pain. 30 tablet 0 Past Week at Unknown time  . oxyCODONE-acetaminophen (PERCOCET/ROXICET) 5-325 MG tablet Take 1-2 tablets by mouth every 4 (four) hours as needed for moderate pain. 30 tablet 0 Past Week at Unknown time      Review of Systems  Constitutional: Positive for fatigue. Negative for fever.  HENT: Negative.   Eyes: Negative for photophobia and visual disturbance.  Respiratory: Negative for cough and shortness of breath.   Cardiovascular: Positive for chest pain (none currently, pain earlier this week).  Gastrointestinal: Positive for constipation. Negative for abdominal distention, abdominal pain, diarrhea, nausea and vomiting.  Endocrine: Negative.   Genitourinary: Positive for pelvic pain and vaginal bleeding. Negative for difficulty urinating, flank pain and vaginal discharge.  Musculoskeletal: Negative.   Skin: Negative.   Allergic/Immunologic: Negative.   Neurological:  Positive for headaches (5/10). Negative for dizziness, weakness and light-headedness.  Hematological: Negative.   Psychiatric/Behavioral: Negative.     Physical Exam  Patient Vitals for the past 24 hrs:   BP Temp Temp src Pulse Resp SpO2 Weight  02/13/18 1630 (!) 164/98 - - 69 - - -  02/13/18 1615 (!) 160/93 - - 62 - - -  02/13/18 1606 (!) 159/93 - - (!) 59 - - -  02/13/18 1545 (!) 147/92 - - 61 - - -  02/13/18 1530 (!) 157/97 - - 62 - - -  02/13/18 1515 (!) 153/88 - - (!) 59 - - -  02/13/18 1500 139/79 - - 60 - - -  02/13/18 1445 (!) 147/80 - - (!) 59 - - -  02/13/18 1430 (!) 150/88 - - 63 - - -  02/13/18 1415 (!) 160/97 - - 63 - - -  02/13/18 1400 (!) 151/103 - - 66 - - -  02/13/18 1346 (!) 150/88 - - 64 - - -  02/13/18 1331 (!) 150/93 - - 65 - - -  02/13/18 1314 (!) 152/96 - - (!) 56 - - -  02/13/18 1304 (!) 144/82 98.2 F (36.8 C) Oral 68 18 - -  02/13/18 1300 - - - - - - 156 lb 4 oz (70.9 kg)    Physical Exam  Nursing note and vitals reviewed. Constitutional: She appears well-developed and well-nourished.  Appears fatigued   HENT:  Head: Normocephalic.  Eyes: Pupils are equal, round, and reactive to light.  Neck: Normal range of motion.  Cardiovascular: Normal rate, regular rhythm and normal heart sounds.  No murmur heard. Respiratory: Effort normal and breath sounds normal. No respiratory distress. She has no wheezes. She exhibits no tenderness.  GI: Soft. Bowel sounds are normal. She exhibits no distension.  Genitourinary:    Uterus normal.  There is no tenderness or lesion on the right labia. There is no tenderness or lesion on the left labia. Uterus is not enlarged and not tender. Cervix exhibits no motion tenderness. Left adnexum displays tenderness.    Vaginal tenderness present.  There is tenderness in the vagina.  Musculoskeletal: Normal range of motion.        General: No edema.  Neurological: She is alert.  Skin: Skin is warm and dry. No rash noted. She  is  not diaphoretic.  Psychiatric: She has a normal mood and affect. Her behavior is normal. Judgment and thought content normal.      MAU Course  Procedures   MDM CBC, CMP, UA Wet prep, GC/CT Percoset - HA/abd pain decreased to 2/10 Serial Bps (MD aware of elevated readings) 30mg  Procardia XL with repeat BP after 30min (remained elevated, with one additional severe range, MD notified) GI cocktail for return of epigastric pain that presented after pelvic exam   Labs reviewed: PEC labs negative       Results for orders placed or performed during the hospital encounter of 02/13/18 (from the past 24 hour(s))  Urine rapid drug screen (hosp performed)not at [facility]     Status: None    Collection Time: 02/13/18  1:17 PM  Result Value Ref Range    Opiates NONE DETECTED NONE DETECTED    Cocaine NONE DETECTED NONE DETECTED    Benzodiazepines NONE DETECTED NONE DETECTED    Amphetamines NONE DETECTED NONE DETECTED    Tetrahydrocannabinol NONE DETECTED NONE DETECTED    Barbiturates NONE DETECTED NONE DETECTED  Urinalysis, Routine w reflex microscopic     Status: Abnormal    Collection Time: 02/13/18  1:20 PM  Result Value Ref Range    Color, Urine YELLOW YELLOW    APPearance CLEAR CLEAR    Specific Gravity, Urine 1.015 1.005 - 1.030    pH 7.0 5.0 - 8.0    Glucose, UA NEGATIVE NEGATIVE mg/dL    Hgb urine dipstick MODERATE (A) NEGATIVE    Bilirubin Urine NEGATIVE NEGATIVE    Ketones, ur NEGATIVE NEGATIVE mg/dL    Protein, ur NEGATIVE NEGATIVE mg/dL    Nitrite NEGATIVE NEGATIVE    Leukocytes, UA SMALL (A) NEGATIVE  Urinalysis, Microscopic (reflex)     Status: Abnormal    Collection Time: 02/13/18  1:20 PM  Result Value Ref Range    RBC / HPF 0-5 0 - 5 RBC/hpf    WBC, UA 6-10 0 - 5 WBC/hpf    Bacteria, UA FEW (A) NONE SEEN    Squamous Epithelial / LPF 0-5 0 - 5    Mucus PRESENT    Wet prep, genital     Status: Abnormal    Collection Time: 02/13/18  2:02 PM  Result Value Ref Range     Yeast Wet Prep HPF POC NONE SEEN NONE SEEN    Trich, Wet Prep NONE SEEN NONE SEEN    Clue Cells Wet Prep HPF POC NONE SEEN NONE SEEN    WBC, Wet Prep HPF POC MANY (A) NONE SEEN    Sperm NONE SEEN    CBC     Status: Abnormal    Collection Time: 02/13/18  2:13 PM  Result Value Ref Range    WBC 5.3 4.0 - 10.5 K/uL    RBC 4.19 3.87 - 5.11 MIL/uL    Hemoglobin 11.1 (L) 12.0 - 15.0 g/dL    HCT 16.134.9 (L) 09.636.0 - 46.0 %    MCV 83.3 80.0 - 100.0 fL    MCH 26.5 26.0 - 34.0 pg    MCHC 31.8 30.0 - 36.0 g/dL    RDW 04.514.0 40.911.5 - 81.115.5 %    Platelets 202 150 - 400 K/uL    nRBC 0.0 0.0 - 0.2 %  Comprehensive metabolic panel     Status: Abnormal    Collection Time: 02/13/18  2:13 PM  Result Value Ref Range    Sodium 138 135 - 145 mmol/L  Potassium 3.8 3.5 - 5.1 mmol/L    Chloride 106 98 - 111 mmol/L    CO2 26 22 - 32 mmol/L    Glucose, Bld 78 70 - 99 mg/dL    BUN 10 6 - 20 mg/dL    Creatinine, Ser 1.61 0.44 - 1.00 mg/dL    Calcium 8.7 (L) 8.9 - 10.3 mg/dL    Total Protein 6.7 6.5 - 8.1 g/dL    Albumin 3.2 (L) 3.5 - 5.0 g/dL    AST 15 15 - 41 U/L    ALT 17 0 - 44 U/L    Alkaline Phosphatase 73 38 - 126 U/L    Total Bilirubin 0.8 0.3 - 1.2 mg/dL    GFR calc non Af Amer >60 >60 mL/min    GFR calc Af Amer >60 >60 mL/min    Anion gap 6 5 - 15    Assessment and Plan  Postpartum preeclampsia - admit for magnesium therapy   Bernerd Limbo, SNM 02/13/2018, 2:48 PM    I confirm that I have verified the information documented in the nurse midwife student's note and that I have also personally reperformed the history, physical exam and all medical decision making activities of this service and have verified that all service and findings are accurately documented in this student's note.    Will admit to OB High Risk due to diagnoses of severe PEC (severe BP), Dr Macon Large agrees with admission and magnesium sulfate for eclampsia prophylaxis.   Sharyon Cable, CNM 02/13/2018 5:06 PM       Attestation of Attending Supervision of Advanced Practice Provider (PA/CNM/NP): Evaluation and management procedures were performed by the Advanced Practice Provider under my supervision and collaboration.  I have reviewed the Advanced Practice Provider's note and chart, and I agree with the management and plan.  Patient will be admitted to [facility] Unit for postpartum severe PEC and started on magnesium sulfate for eclampsia prophylaxis. Will start Enalapril 10 mg daily for BP control; Hydralazine prn severe range BP.  Continue close observation and routine floorcare.     Jaynie Collins, MD, FACOG Obstetrician & Gynecologist, [facility] for [employer], [facility]

## 2018-02-13 NOTE — MAU Provider Note (Addendum)
History     CSN: [id number]  Arrival date and time: 02/13/18 1233   First Provider Initiated Contact with Patient 02/13/18 1407      Chief Complaint  Patient presents with  . Abdominal Pain  . Hypertension   Cindy Pearson is a 23 y.o. [phone] female s/p 3rd CS 7 days ago presenting with new onset pelvic and low back pain, and increased dark brown bleeding. She had some chest pain previously, but none at this time. She also has a mild headache and elevated blood pressures (encouraged to come in by home RN). She has not been taking her pain meds due to constipation, and is concerned she has endometritis (she got it after her last delivery).   OB History    Gravida  5   Para  4   Term  4   Preterm  0   AB  1   Living  4     SAB  1   TAB  0   Ectopic  0   Multiple  0   Live Births  4           Past Medical History:  Diagnosis Date  . Asthma    Albuterol INH prn  . Blood transfusion without reported diagnosis   . Chlamydia 2012   hasn't picked up rx, not treated yet  . 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 Preterm labor 2011    Past Surgical History:  Procedure Laterality Date  . CESAREAN SECTION    . CESAREAN SECTION N/A 05/26/2016   Procedure: CESAREAN SECTION;  Surgeon: Lazaro Arms, MD;  Location: [facility] BIRTHING SUITES;  Service: Obstetrics;  Laterality: N/A;  . CESAREAN SECTION N/A 02/05/2018   Procedure: CESAREAN SECTION;  Surgeon: Conan Bowens, MD;  Location: [facility] BIRTHING SUITES;  Service: Obstetrics;  Laterality: N/A;  . TONSILLECTOMY AND ADENOIDECTOMY      Family History  Problem Relation Age of Onset  . Diabetes Maternal Grandmother   . Heart disease Maternal Grandfather   . Cancer Maternal Grandfather   . Heart disease Paternal Grandfather   . Cancer Father     Social History   Tobacco Use  . Smoking status: Current Every Day Smoker  . Smokeless tobacco: Never Used  . Tobacco comment: started again 02/11/2018  Substance Use Topics   . Alcohol use: Not Currently    Comment: occasional  . Drug use: Yes    Types: Marijuana    Comment: last smoked in September 2019    Allergies:  Allergies  Allergen Reactions  . Penicillins Hives    Patient states that she is allergic to all penicillins. Has patient had a PCN reaction causing immediate rash, facial/tongue/throat swelling, SOB or lightheadedness with hypotension: YES Has patient had a PCN reaction causing severe rash involving mucus membranes or skin necrosis: NO Has patient had a PCN reaction that required hospitalization NO Has patient had a PCN reaction occurring within the last 10 years:NO If all of the above answers are "NO", then may proceed with Cephalosporin use.    Medications Prior to Admission  Medication Sig Dispense Refill Last Dose  . ibuprofen (ADVIL,MOTRIN) 600 MG tablet Take 1 tablet (600 mg total) by mouth every 6 (six) hours as needed for mild pain or moderate pain. 30 tablet 0 Past Week at Unknown time  . oxyCODONE-acetaminophen (PERCOCET/ROXICET) 5-325 MG tablet Take 1-2 tablets by mouth every 4 (four) hours as needed for moderate pain.  30 tablet 0 Past Week at Unknown time    Review of Systems  Constitutional: Positive for fatigue. Negative for fever.  HENT: Negative.   Eyes: Negative for photophobia and visual disturbance.  Respiratory: Negative for cough and shortness of breath.   Cardiovascular: Positive for chest pain (none currently, pain earlier this week).  Gastrointestinal: Positive for constipation. Negative for abdominal distention, abdominal pain, diarrhea, nausea and vomiting.  Endocrine: Negative.   Genitourinary: Positive for pelvic pain and vaginal bleeding. Negative for difficulty urinating, flank pain and vaginal discharge.  Musculoskeletal: Negative.   Skin: Negative.   Allergic/Immunologic: Negative.   Neurological: Positive for headaches (5/10). Negative for dizziness, weakness and light-headedness.  Hematological:  Negative.   Psychiatric/Behavioral: Negative.    Physical Exam  Patient Vitals for the past 24 hrs:  BP Temp Temp src Pulse Resp SpO2 Weight  02/13/18 1630 (!) 164/98 - - 69 - - -  02/13/18 1615 (!) 160/93 - - 62 - - -  02/13/18 1606 (!) 159/93 - - (!) 59 - - -  02/13/18 1545 (!) 147/92 - - 61 - - -  02/13/18 1530 (!) 157/97 - - 62 - - -  02/13/18 1515 (!) 153/88 - - (!) 59 - - -  02/13/18 1500 139/79 - - 60 - - -  02/13/18 1445 (!) 147/80 - - (!) 59 - - -  02/13/18 1430 (!) 150/88 - - 63 - - -  02/13/18 1415 (!) 160/97 - - 63 - - -  02/13/18 1400 (!) 151/103 - - 66 - - -  02/13/18 1346 (!) 150/88 - - 64 - - -  02/13/18 1331 (!) 150/93 - - 65 - - -  02/13/18 1314 (!) 152/96 - - (!) 56 - - -  02/13/18 1304 (!) 144/82 98.2 F (36.8 C) Oral 68 18 - -  02/13/18 1300 - - - - - - 156 lb 4 oz (70.9 kg)   Physical Exam  Nursing note and vitals reviewed. Constitutional: She appears well-developed and well-nourished.  Appears fatigued   HENT:  Head: Normocephalic.  Eyes: Pupils are equal, round, and reactive to light.  Neck: Normal range of motion.  Cardiovascular: Normal rate, regular rhythm and normal heart sounds.  No murmur heard. Respiratory: Effort normal and breath sounds normal. No respiratory distress. She has no wheezes. She exhibits no tenderness.  GI: Soft. Bowel sounds are normal. She exhibits no distension.  Genitourinary:    Uterus normal.  There is no tenderness or lesion on the right labia. There is no tenderness or lesion on the left labia. Uterus is not enlarged and not tender. Cervix exhibits no motion tenderness. Left adnexum displays tenderness.    Vaginal tenderness present.  There is tenderness in the vagina.  Musculoskeletal: Normal range of motion.        General: No edema.  Neurological: She is alert.  Skin: Skin is warm and dry. No rash noted. She is not diaphoretic.  Psychiatric: She has a normal mood and affect. Her behavior is normal. Judgment and  thought content normal.    MAU Course  Procedures  MDM CBC, CMP, UA Wet prep, GC/CT Percoset - HA/abd pain decreased to 2/10 Serial Bps (MD aware of elevated readings) 30mg  Procardia XL with repeat BP after 30min (remained elevated, with one additional severe range, MD notified) GI cocktail for return of epigastric pain that presented after pelvic exam  Labs reviewed: PEC labs negative  Results for orders placed or performed during  the hospital encounter of 02/13/18 (from the past 24 hour(s))  Urine rapid drug screen (hosp performed)not at [facility]     Status: None   Collection Time: 02/13/18  1:17 PM  Result Value Ref Range   Opiates NONE DETECTED NONE DETECTED   Cocaine NONE DETECTED NONE DETECTED   Benzodiazepines NONE DETECTED NONE DETECTED   Amphetamines NONE DETECTED NONE DETECTED   Tetrahydrocannabinol NONE DETECTED NONE DETECTED   Barbiturates NONE DETECTED NONE DETECTED  Urinalysis, Routine w reflex microscopic     Status: Abnormal   Collection Time: 02/13/18  1:20 PM  Result Value Ref Range   Color, Urine YELLOW YELLOW   APPearance CLEAR CLEAR   Specific Gravity, Urine 1.015 1.005 - 1.030   pH 7.0 5.0 - 8.0   Glucose, UA NEGATIVE NEGATIVE mg/dL   Hgb urine dipstick MODERATE (A) NEGATIVE   Bilirubin Urine NEGATIVE NEGATIVE   Ketones, ur NEGATIVE NEGATIVE mg/dL   Protein, ur NEGATIVE NEGATIVE mg/dL   Nitrite NEGATIVE NEGATIVE   Leukocytes, UA SMALL (A) NEGATIVE  Urinalysis, Microscopic (reflex)     Status: Abnormal   Collection Time: 02/13/18  1:20 PM  Result Value Ref Range   RBC / HPF 0-5 0 - 5 RBC/hpf   WBC, UA 6-10 0 - 5 WBC/hpf   Bacteria, UA FEW (A) NONE SEEN   Squamous Epithelial / LPF 0-5 0 - 5   Mucus PRESENT   Wet prep, genital     Status: Abnormal   Collection Time: 02/13/18  2:02 PM  Result Value Ref Range   Yeast Wet Prep HPF POC NONE SEEN NONE SEEN   Trich, Wet Prep NONE SEEN NONE SEEN   Clue Cells Wet Prep HPF POC NONE SEEN NONE SEEN   WBC, Wet  Prep HPF POC MANY (A) NONE SEEN   Sperm NONE SEEN   CBC     Status: Abnormal   Collection Time: 02/13/18  2:13 PM  Result Value Ref Range   WBC 5.3 4.0 - 10.5 K/uL   RBC 4.19 3.87 - 5.11 MIL/uL   Hemoglobin 11.1 (L) 12.0 - 15.0 g/dL   HCT 16.1 (L) 09.6 - 04.5 %   MCV 83.3 80.0 - 100.0 fL   MCH 26.5 26.0 - 34.0 pg   MCHC 31.8 30.0 - 36.0 g/dL   RDW 40.9 81.1 - 91.4 %   Platelets 202 150 - 400 K/uL   nRBC 0.0 0.0 - 0.2 %  Comprehensive metabolic panel     Status: Abnormal   Collection Time: 02/13/18  2:13 PM  Result Value Ref Range   Sodium 138 135 - 145 mmol/L   Potassium 3.8 3.5 - 5.1 mmol/L   Chloride 106 98 - 111 mmol/L   CO2 26 22 - 32 mmol/L   Glucose, Bld 78 70 - 99 mg/dL   BUN 10 6 - 20 mg/dL   Creatinine, Ser 7.82 0.44 - 1.00 mg/dL   Calcium 8.7 (L) 8.9 - 10.3 mg/dL   Total Protein 6.7 6.5 - 8.1 g/dL   Albumin 3.2 (L) 3.5 - 5.0 g/dL   AST 15 15 - 41 U/L   ALT 17 0 - 44 U/L   Alkaline Phosphatase 73 38 - 126 U/L   Total Bilirubin 0.8 0.3 - 1.2 mg/dL   GFR calc non Af Amer >60 >60 mL/min   GFR calc Af Amer >60 >60 mL/min   Anion gap 6 5 - 15   Assessment and Plan  Postpartum preeclampsia - admit for  magnesium therapy  Bernerd LimboJamilla R Walker, SNM 02/13/2018, 2:48 PM   I confirm that I have verified the information documented in the nurse midwife student's note and that I have also personally reperformed the history, physical exam and all medical decision making activities of this service and have verified that all service and findings are accurately documented in this student's note.   Will admit to OB High Risk due to diagnoses of PEC (severe BP), Dr Macon LargeAnyanwu agrees with admission and magnesium   Sharyon CableRogers, Makinzie Considine C, CNM 02/13/2018 5:06 PM

## 2018-02-14 ENCOUNTER — Encounter (HOSPITAL_COMMUNITY): Payer: Self-pay | Admitting: *Deleted

## 2018-02-14 LAB — CBC WITH DIFFERENTIAL/PLATELET
BASOS ABS: 0 10*3/uL (ref 0.0–0.1)
Basophils Relative: 1 %
Eosinophils Absolute: 0.2 10*3/uL (ref 0.0–0.5)
Eosinophils Relative: 4 %
HCT: 35.6 % — ABNORMAL LOW (ref 36.0–46.0)
Hemoglobin: 11.1 g/dL — ABNORMAL LOW (ref 12.0–15.0)
Lymphocytes Relative: 46 %
Lymphs Abs: 2.2 10*3/uL (ref 0.7–4.0)
MCH: 26.2 pg (ref 26.0–34.0)
MCHC: 31.2 g/dL (ref 30.0–36.0)
MCV: 84 fL (ref 80.0–100.0)
Monocytes Absolute: 0.4 10*3/uL (ref 0.1–1.0)
Monocytes Relative: 9 %
Neutro Abs: 1.8 10*3/uL (ref 1.7–7.7)
Neutrophils Relative %: 40 %
Platelets: 222 10*3/uL (ref 150–400)
RBC: 4.24 MIL/uL (ref 3.87–5.11)
RDW: 14.1 % (ref 11.5–15.5)
WBC: 4.6 10*3/uL (ref 4.0–10.5)
nRBC: 0 % (ref 0.0–0.2)

## 2018-02-14 LAB — COMPREHENSIVE METABOLIC PANEL
ALT: 16 U/L (ref 0–44)
AST: 17 U/L (ref 15–41)
Albumin: 3.1 g/dL — ABNORMAL LOW (ref 3.5–5.0)
Alkaline Phosphatase: 65 U/L (ref 38–126)
Anion gap: 8 (ref 5–15)
BUN: 7 mg/dL (ref 6–20)
CO2: 27 mmol/L (ref 22–32)
Calcium: 7 mg/dL — ABNORMAL LOW (ref 8.9–10.3)
Chloride: 103 mmol/L (ref 98–111)
Creatinine, Ser: 0.49 mg/dL (ref 0.44–1.00)
GFR calc Af Amer: >60 mL/min (ref >60)
Glucose, Bld: 86 mg/dL (ref 70–99)
Potassium: 3.1 mmol/L — ABNORMAL LOW (ref 3.5–5.1)
Sodium: 138 mmol/L (ref 135–145)
Total Bilirubin: 0.5 mg/dL (ref 0.3–1.2)
Total Protein: 6.4 g/dL — ABNORMAL LOW (ref 6.5–8.1)

## 2018-02-14 LAB — MAGNESIUM: MAGNESIUM: 4.5 mg/dL — AB (ref 1.7–2.4)

## 2018-02-14 LAB — GC/CHLAMYDIA PROBE AMP (~~LOC~~) NOT AT ARMC
Chlamydia: NEGATIVE
Neisseria Gonorrhea: NEGATIVE

## 2018-02-14 MED ORDER — POTASSIUM CHLORIDE CRYS ER 20 MEQ PO TBCR: 40.0000 meq | EXTENDED_RELEASE_TABLET | Freq: Two times a day (BID) | ORAL | Status: DC

## 2018-02-14 MED ORDER — POTASSIUM CHLORIDE CRYS ER 20 MEQ PO TBCR
40.0000 meq | EXTENDED_RELEASE_TABLET | Freq: Two times a day (BID) | ORAL | Status: DC
  Administered 2018-02-14 (×2): 40 meq via ORAL
  Filled 2018-02-14 (×3): qty 2

## 2018-02-14 MED ORDER — BUTALBITAL-APAP-CAFFEINE 50-325-40 MG PO TABS: 2.0000 | ORAL_TABLET | Freq: Four times a day (QID) | ORAL | Status: DC | PRN

## 2018-02-14 MED ORDER — OXYCODONE HCL 5 MG PO TABS: 10.0000 mg | ORAL_TABLET | Freq: Four times a day (QID) | ORAL | Status: DC | PRN

## 2018-02-14 MED ORDER — BUTALBITAL-APAP-CAFFEINE 50-325-40 MG PO TABS
1.0000 | ORAL_TABLET | ORAL | Status: DC | PRN
  Administered 2018-02-14 (×2): 1 via ORAL
  Filled 2018-02-14 (×2): qty 1

## 2018-02-14 MED ORDER — ENALAPRIL MALEATE 10 MG PO TABS: 10.0000 mg | ORAL_TABLET | Freq: Every day | ORAL | 3 refills | Status: DC

## 2018-02-14 MED ORDER — OXYCODONE HCL 5 MG PO TABS: 5.0000 mg | ORAL_TABLET | ORAL | Status: DC | PRN

## 2018-02-14 NOTE — Discharge Summary (Signed)
Physician Discharge Summary  Patient ID: Cindy Pearson MRN: [id number] DOB/AGE: 1995-11-19 22 y.o.  Admit date: 02/13/2018 Discharge date: 02/14/2018  Discharge Diagnoses:  Principal Problem:   Postpartum severe preeclampsia Active Problems:   S/P cesarean section   Reason for Admission: post partum pre-eclampisa Postpartum Procedures: MgSO4  Hospital Course: Please see HPI dated 02/13/2018 for details. This is a 23 y.o. [id number] now POD#9 from a scheduled repeat c-section. Re-admitted POD#8 for severe postpartum pre-eclampsia. She received 24 hrs MgSO4 and was started on vasotec 10 mg daily. Her blood pressure was well controlled on the vasotec and she was discharged home HD#2 in good condition.    Instructions for follow up given, she will follow up 4 days for BP check, incision check.   Physical exam  Vitals:   02/14/18 1547 02/14/18 1800 02/14/18 1914 02/14/18 2021  BP: 112/79 108/73 115/68 109/76  Pulse: 83 76 82 75  Resp: 16 16    Temp: 98.1 F (36.7 C) 98.5 F (36.9 C) 98.9 F (37.2 C)   TempSrc: Oral Oral Oral   SpO2: 100% 100%    Weight:      Height:       Physical Exam:  General: alert, oriented, cooperative Chest: normal respiratory effort Heart: RRR  Abdomen: soft, appropriately tender to palpation, incision clean/dry/intact Uterine Fundus: firm, below the umbilicus Lochia: moderate, rubra DVT Evaluation: no evidence of DVT Extremities: no edema, no calf tenderness   Postpartum contraception: Not Discussed  Disposition: home  Discharged Condition: good  Discharge Instructions    Ambulatory referral to Lactation   Complete by:  As directed    Reason for consult:  The Mother-Infant Dyad Needs Assistance in the Continuation of Breastfeeding   Call MD for:  difficulty breathing, headache or visual disturbances   Complete by:  As directed    Call MD for:  extreme fatigue   Complete by:  As directed    Call MD for:  hives   Complete by:  As directed    Call MD for:  persistant dizziness or light-headedness   Complete by:  As directed    Call MD for:  persistant nausea and vomiting   Complete by:  As directed    Call MD for:  redness, tenderness, or signs of infection (pain, swelling, redness, odor or green/yellow discharge around incision site)   Complete by:  As directed    Call MD for:  severe uncontrolled pain   Complete by:  As directed    Call MD for:  temperature >100.4   Complete by:  As directed    Diet - low sodium heart healthy   Complete by:  As directed      Allergies as of 02/14/2018      Reactions   Penicillins Hives   Patient states that she is allergic to all penicillins. Has patient had a PCN reaction causing immediate rash, facial/tongue/throat swelling, SOB or lightheadedness with hypotension: YES Has patient had a PCN reaction causing severe rash involving mucus membranes or skin necrosis: NO Has patient had a PCN reaction that required hospitalization NO Has patient had a PCN reaction occurring within the last 10 years:NO If all of the above answers are "NO", then may proceed with Cephalosporin use.      Medication List    TAKE these medications   enalapril 10 MG tablet Commonly known as:  VASOTEC Take 1 tablet (10 mg total) by mouth daily. Start taking on:  February 15, 2018  ibuprofen 600 MG tablet Commonly known as:  ADVIL,MOTRIN Take 1 tablet (600 mg total) by mouth every 6 (six) hours as needed for mild pain or moderate pain.   oxyCODONE-acetaminophen 5-325 MG tablet Commonly known as:  PERCOCET/ROXICET Take 1-2 tablets by mouth every 4 (four) hours as needed for moderate pain.      Follow-up Information    [facility]. Schedule an appointment as soon as possible for a visit in 3 day(s).   Specialty:  Obstetrics and Gynecology Contact information: [address] [place] [address] [phone]           Signed: Conan Bowens 02/14/2018, 9:49 PM

## 2018-02-14 NOTE — Progress Notes (Signed)
Faculty Practice OB/GYN Attending Note  Subjective:  No sentinel events overnight.  Patient denies any headaches, visual symptoms, RUQ/epigastric pain or other concerning symptoms.  Admitted on 02/13/2018 for Hypertension in pregnancy, preeclampsia, severe, postpartum condition.    Objective:  Blood pressure (!) 106/59, pulse 68, temperature 97.6 F (36.4 C), temperature source Oral, resp. rate 12, weight 70.9 kg, SpO2 97 %, unknown if currently breastfeeding. Patient Vitals for the past 24 hrs:  BP Temp Temp src Pulse Resp SpO2 Weight  02/14/18 0600 - - - - 12 - -  02/14/18 0500 - - - - 14 - -  02/14/18 0400 (!) 106/59 97.6 F (36.4 C) Oral 68 16 - -  02/14/18 0301 - - - - 12 - -  02/14/18 0205 - - - - 12 - -  02/14/18 0058 - - - - 12 - -  02/14/18 0001 (!) 105/56 97.8 F (36.6 C) Oral 70 14 97 % -  02/13/18 2259 108/62 - - 75 16 - -  02/13/18 2200 110/72 - - 77 18 100 % -  02/13/18 2115 - - - - 16 - -  02/13/18 2036 103/63 98.3 F (36.8 C) - 74 15 99 % -  02/13/18 1941 103/67 98 F (36.7 C) Oral 75 15 99 % -  02/13/18 1823 111/73 98.3 F (36.8 C) - 68 17 99 % -  02/13/18 1735 115/76 - - 72 - 99 % -  02/13/18 1730 121/80 - - 66 - 99 % -  02/13/18 1723 (!) 127/97 - - 65 - 99 % -  02/13/18 1718 (!) 132/94 - - 60 - 99 % -  02/13/18 1700 (!) 148/93 98.2 F (36.8 C) Oral 62 16 99 % -  02/13/18 1630 (!) 164/98 - - 69 - - -  02/13/18 1615 (!) 160/93 - - 62 - - -  02/13/18 1606 (!) 159/93 - - (!) 59 - - -  02/13/18 1545 (!) 147/92 - - 61 - - -  02/13/18 1530 (!) 157/97 - - 62 - - -  02/13/18 1515 (!) 153/88 - - (!) 59 - - -  02/13/18 1500 139/79 - - 60 - - -  02/13/18 1445 (!) 147/80 - - (!) 59 - - -  02/13/18 1430 (!) 150/88 - - 63 - - -  02/13/18 1415 (!) 160/97 - - 63 - - -  02/13/18 1400 (!) 151/103 - - 66 - - -  02/13/18 1346 (!) 150/88 - - 64 - - -  02/13/18 1331 (!) 150/93 - - 65 - - -  02/13/18 1314 (!) 152/96 - - (!) 56 - - -  02/13/18 1304 (!) 144/82 98.2 F (36.8  C) Oral 68 18 - -  02/13/18 1300 - - - - - - 70.9 kg    Gen: NAD HENT: Normocephalic, atraumatic Lungs: Normal respiratory effort Heart: Regular rate noted Abdomen: NT, Incision C/D/I Cervix: Deferred Ext: 2+ DTRs, no edema, no cyanosis, negative Homan's sign  Labs: CBC Latest Ref Rng & Units 02/13/2018 02/06/2018 02/05/2018  WBC 4.0 - 10.5 K/uL 5.3 12.1(H) 12.6(H)  Hemoglobin 12.0 - 15.0 g/dL 11.1(L) 9.7(L) 10.8(L)  Hematocrit 36.0 - 46.0 % 34.9(L) 29.7(L) 33.4(L)  Platelets 150 - 400 K/uL 202 129(L) 152   CMP Latest Ref Rng & Units 02/13/2018 02/05/2018 06/13/2016  Glucose 70 - 99 mg/dL 78 - 79  BUN 6 - 20 mg/dL 10 - 8  Creatinine 1.22 - 1.00 mg/dL 4.49 7.53(Y) 0.51  Sodium  135 - 145 mmol/L 138 - 136  Potassium 3.5 - 5.1 mmol/L 3.8 - 3.5  Chloride 98 - 111 mmol/L 106 - 102  CO2 22 - 32 mmol/L 26 - 24  Calcium 8.9 - 10.3 mg/dL 5.6(L8.7(L) - 8.9  Total Protein 6.5 - 8.1 g/dL 6.7 - 8.4(H)  Total Bilirubin 0.3 - 1.2 mg/dL 0.8 - 1.0  Alkaline Phos 38 - 126 U/L 73 - 77  AST 15 - 41 U/L 15 - 14(L)  ALT 0 - 44 U/L 17 - 10(L)    Assessment & Plan:  23 y.o. [id number] admitted for postpartum preeclampsia (BP) - Continue Magnesium sulfate for 24 hours (up around 1700) - Continue Enalapril, BP is stable now - Continue close observation. - Consider discharge to home later today if stable, or tomorrow morning     Jaynie CollinsUGONNA  Cayman Brogden, MD, FACOG Obstetrician & Gynecologist, [facility], [facility]

## 2018-02-18 ENCOUNTER — Telehealth: Payer: Self-pay | Admitting: *Deleted

## 2018-02-18 NOTE — Telephone Encounter (Signed)
Pt called to office stating she did not get a prescription for Percocet and is still having pain.   Attempt to return call. No answer, LM on VM

## 2018-02-19 ENCOUNTER — Ambulatory Visit (INDEPENDENT_AMBULATORY_CARE_PROVIDER_SITE_OTHER): Payer: Medicaid Other | Admitting: Obstetrics

## 2018-02-19 ENCOUNTER — Encounter: Payer: Self-pay | Admitting: Obstetrics

## 2018-02-19 DIAGNOSIS — K5901 Slow transit constipation: Secondary | ICD-10-CM

## 2018-02-19 DIAGNOSIS — Z3009 Encounter for other general counseling and advice on contraception: Secondary | ICD-10-CM

## 2018-02-19 DIAGNOSIS — Z3042 Encounter for surveillance of injectable contraceptive: Secondary | ICD-10-CM

## 2018-02-19 DIAGNOSIS — G8918 Other acute postprocedural pain: Secondary | ICD-10-CM

## 2018-02-19 DIAGNOSIS — Z30013 Encounter for initial prescription of injectable contraceptive: Secondary | ICD-10-CM

## 2018-02-19 MED ORDER — POLYETHYLENE GLYCOL 3350 17 G PO PACK: 17.0000 g | PACK | Freq: Every day | ORAL | 5 refills | Status: DC

## 2018-02-19 MED ORDER — MEDROXYPROGESTERONE ACETATE 150 MG/ML IM SUSP: 150.0000 mg | INTRAMUSCULAR | 3 refills | Status: DC

## 2018-02-19 MED ORDER — MEDROXYPROGESTERONE ACETATE 150 MG/ML IM SUSP
150.0000 mg | Freq: Once | INTRAMUSCULAR | Status: AC
  Administered 2018-02-19: 150 mg via INTRAMUSCULAR

## 2018-02-19 MED ORDER — OXYCODONE-ACETAMINOPHEN 5-325 MG PO TABS: 1.0000 | ORAL_TABLET | Freq: Four times a day (QID) | ORAL | 0 refills | Status: DC | PRN

## 2018-02-19 MED ORDER — DOCUSATE SODIUM 100 MG PO CAPS: 100.0000 mg | ORAL_CAPSULE | Freq: Two times a day (BID) | ORAL | 5 refills | Status: DC

## 2018-02-19 NOTE — Progress Notes (Signed)
Subjective:     Cindy Pearson is a 23 y.o. female who presents for a postpartum visit. She is 2 weeks postpartum following a low cervical transverse Cesarean section. I have fully reviewed the prenatal and intrapartum course. The delivery was at 39 gestational weeks. Outcome: repeat cesarean section, low transverse incision. Anesthesia: spinal. Postpartum course has been normal. Baby's course has been normal. Baby is feeding by breast. Bleeding thin lochia. Bowel function is complicated by mild constipation. Bladder function is normal. Patient is not sexually active. Contraception method is abstinence. Postpartum depression screening: negative.  Tobacco, alcohol and substance abuse history reviewed.  Adult immunizations reviewed including TDAP, rubella and varicella.  The following portions of the patient's history were reviewed and updated as appropriate: allergies, current medications, past family history, past medical history, past social history, past surgical history and problem list.  Review of Systems A comprehensive review of systems was negative.   Objective:    BP 125/77 (BP Location: Right Arm, Patient Position: Sitting, Cuff Size: Normal)   Pulse 97   Resp 16   Ht 5\' 1"  (1.549 m)   Wt 148 lb 3.2 oz (67.2 kg)   Breastfeeding Yes   BMI 28.00 kg/m   General:  alert and no distress   Breasts:  inspection negative, no nipple discharge or bleeding, no masses or nodularity palpable  Lungs: clear to auscultation bilaterally  Heart:  regular rate and rhythm, S1, S2 normal, no murmur, click, rub or gallop  Abdomen: soft, non-tender; bowel sounds normal; no masses,  no organomegaly and incision is clean, dry and intact    50% of 15 min visit spent on counseling and coordination of care.   Assessment:   1. Postpartum care following cesarean delivery  2. Postoperative pain Rx: - oxyCODONE-acetaminophen (PERCOCET/ROXICET) 5-325 MG tablet; Take 1-2 tablets by mouth every 6 (six) hours  as needed for moderate pain.  Dispense: 20 tablet; Refill: 0  3. Constipation by delayed colonic transit Rx: - docusate sodium (COLACE) 100 MG capsule; Take 1 capsule (100 mg total) by mouth 2 (two) times daily.  Dispense: 60 capsule; Refill: 5 - polyethylene glycol (MIRALAX) packet; Take 17 g by mouth at bedtime.  Dispense: 30 each; Refill: 5  4. Encounter for other general counseling and advice on contraception Rx: - wants medroxyPROGESTERone (DEPO-PROVERA) injections  5. Encounter for prescription for depo-Provera Rx: - medroxyPROGESTERone (DEPO-PROVERA) 150 MG/ML injection; Inject 1 mL (150 mg total) into the muscle every 3 (three) months.  Dispense: 1 mL; Refill: 3  Plan:    1. Contraception: Depo-Provera injections 2. Continue PNV's 3. Follow up in: 2 weeks or as needed.     Brock Bad MD 02-19-2018

## 2018-02-19 NOTE — Progress Notes (Signed)
Patient reports no IC in two weeks. Patient tolerated injection - RUOQ well with no complaints. Patient advised to schedule next Depo Provera injection between 05/01 - 05/22/2018. [address] [place], [place]

## 2018-02-27 ENCOUNTER — Telehealth: Payer: Self-pay

## 2018-02-27 NOTE — Telephone Encounter (Signed)
Pt called asking if she still needs to take her BP medication she was prescribed in the hospital after delivery. Advised pt to continue prescribed medication until PP appt next week. Pt reports some light spotting. I advised pt this is normal as she delivered recently. Gave pt bleeding precautions, pt verbalized understanding.

## 2018-03-05 ENCOUNTER — Ambulatory Visit: Payer: Medicaid Other | Admitting: Obstetrics and Gynecology

## 2018-03-05 ENCOUNTER — Ambulatory Visit: Payer: Self-pay | Admitting: Obstetrics

## 2018-05-11 ENCOUNTER — Telehealth: Payer: Self-pay | Admitting: *Deleted

## 2018-05-11 NOTE — Telephone Encounter (Signed)
Pt called to office to discuss bleeding on Depo. Return call to pt. Pt states she is bleeding while on Depo.  Pt states she will start cramping and spotting for a few days followed by several days of heavy bleeding.  Pt has recently started on Depo after delivery.  Pt states recent cycle started on 4/24 and she is still bleeding.  Pt states her cramping is significant as well, feels like "labor pains". Pt is due for next Depo injection this week.   Pt made aware message to be sent to provider for recommendations.  Please advise

## 2018-05-12 ENCOUNTER — Other Ambulatory Visit: Payer: Self-pay | Admitting: Obstetrics

## 2018-05-12 DIAGNOSIS — N939 Abnormal uterine and vaginal bleeding, unspecified: Secondary | ICD-10-CM

## 2018-05-12 MED ORDER — NORETHINDRONE ACETATE 5 MG PO TABS: 5.0000 mg | ORAL_TABLET | Freq: Every day | ORAL | 0 refills | Status: DC

## 2018-05-14 ENCOUNTER — Ambulatory Visit: Payer: Medicaid Other

## 2018-05-15 ENCOUNTER — Telehealth: Payer: Self-pay | Admitting: *Deleted

## 2018-05-15 NOTE — Telephone Encounter (Signed)
Pt made aware of Rx sent in by provider. Advised to call if no change in symptoms after use.

## 2018-05-20 ENCOUNTER — Ambulatory Visit: Payer: Medicaid Other

## 2018-05-23 IMAGING — US US MFM UA CORD DOPPLER
1 series · 14 of 28 positions shown · non-contrast
Comparison: none

[Series 1: us mfm ua cord doppler · 69 acquisitions, 14 frames shown]
[im 3/69]
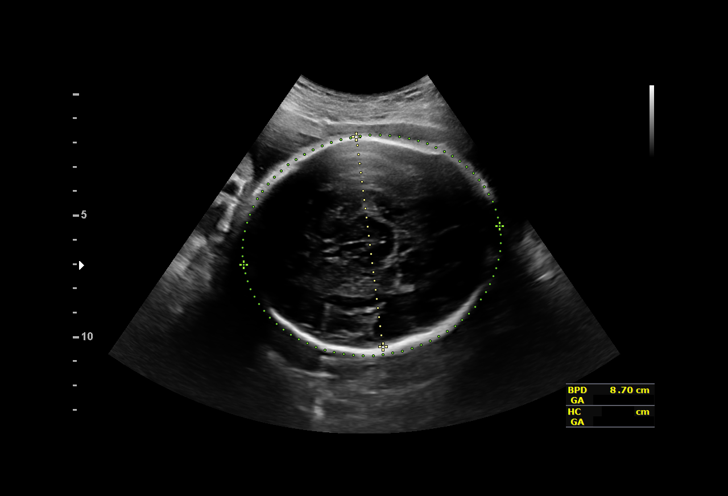
[im 8/69]
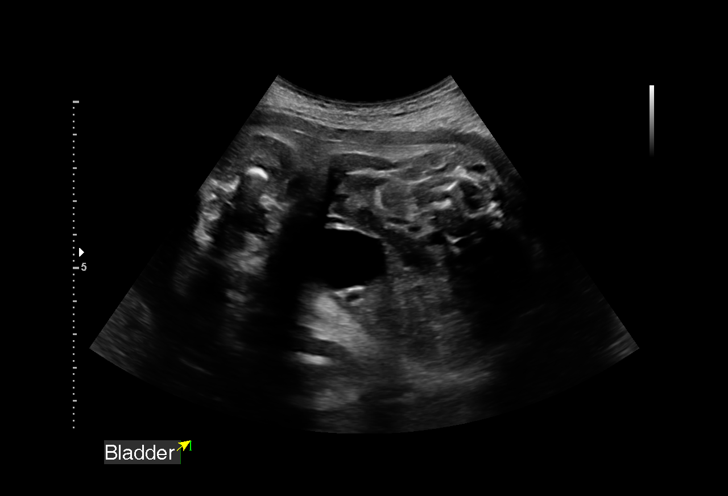
[im 13/69]
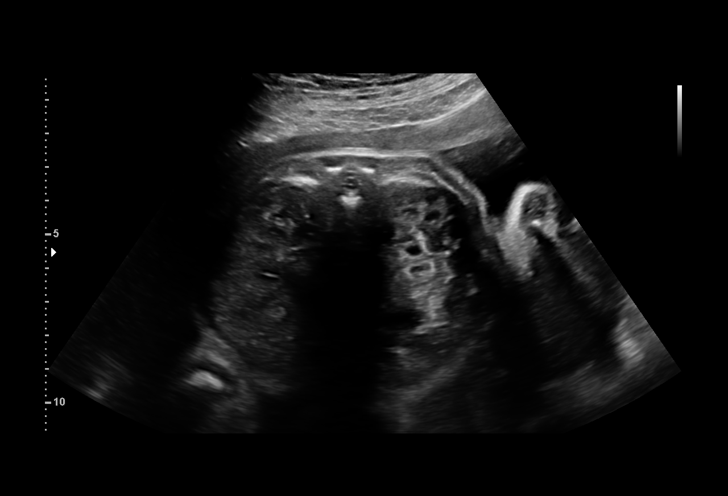
[im 18/69]
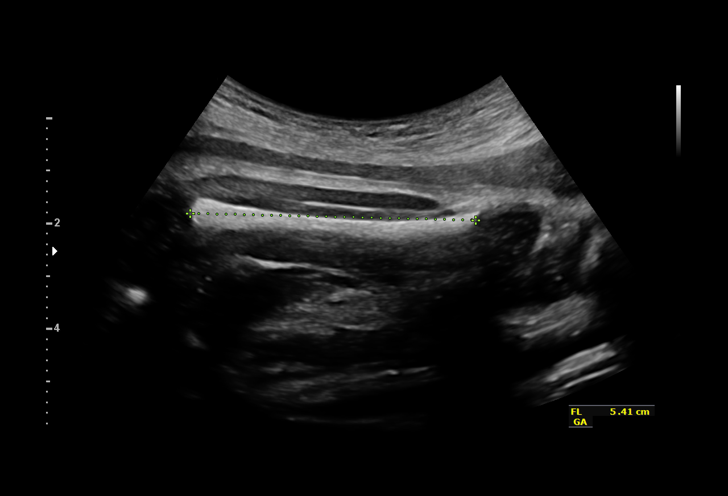
[im 23/69]
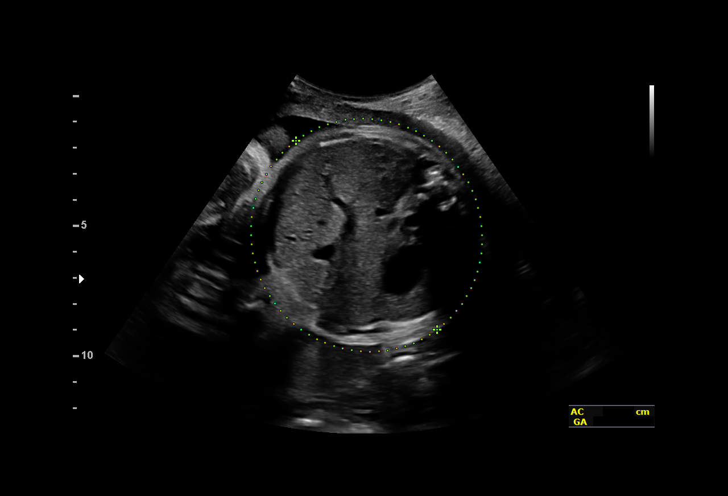
[im 28/69]
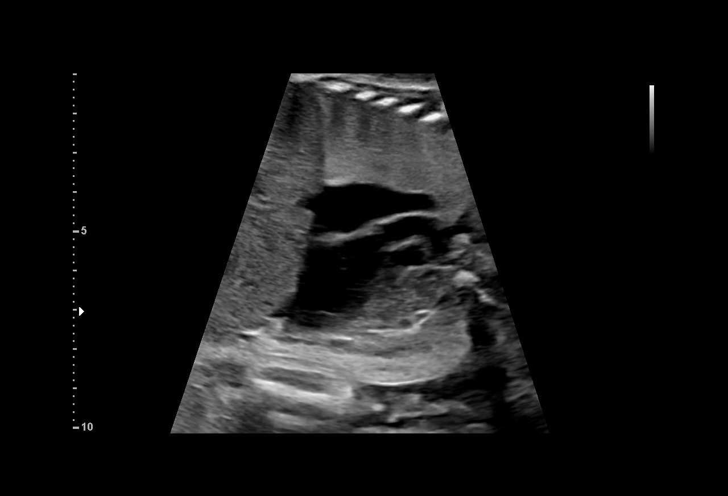
[im 33/69]
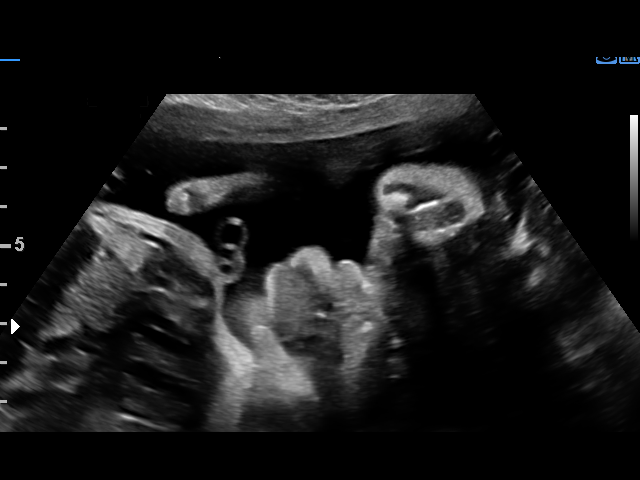
[im 38/69]
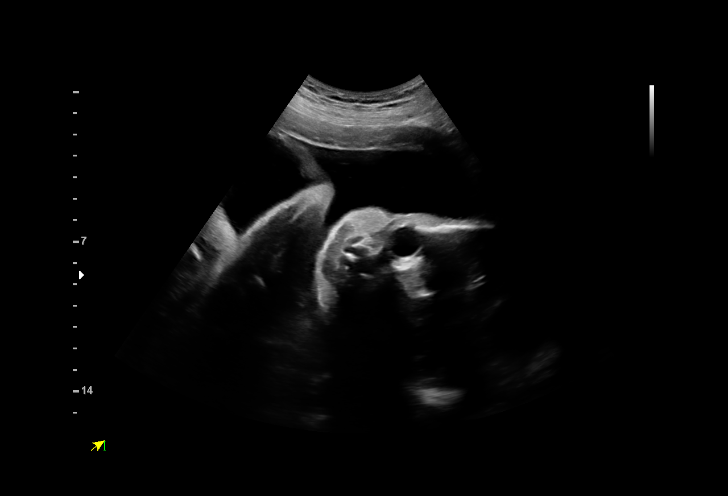
[im 43/69]
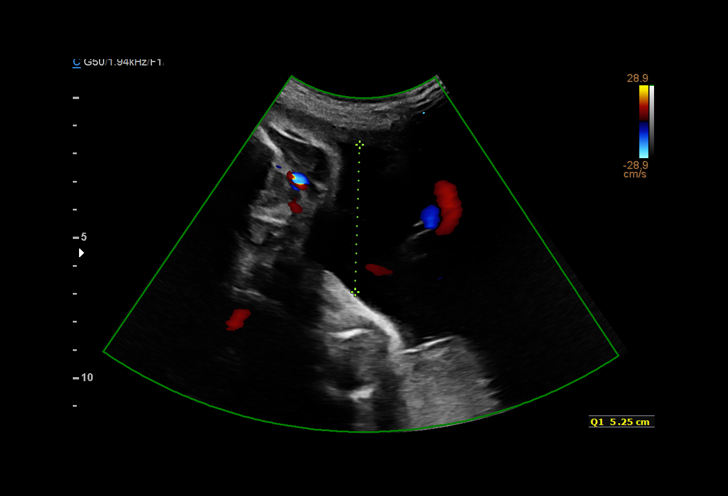
[im 48/69]
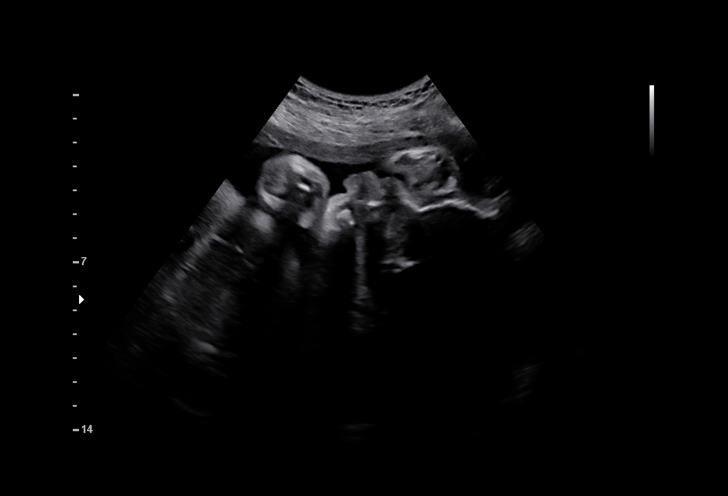
[im 53/69]
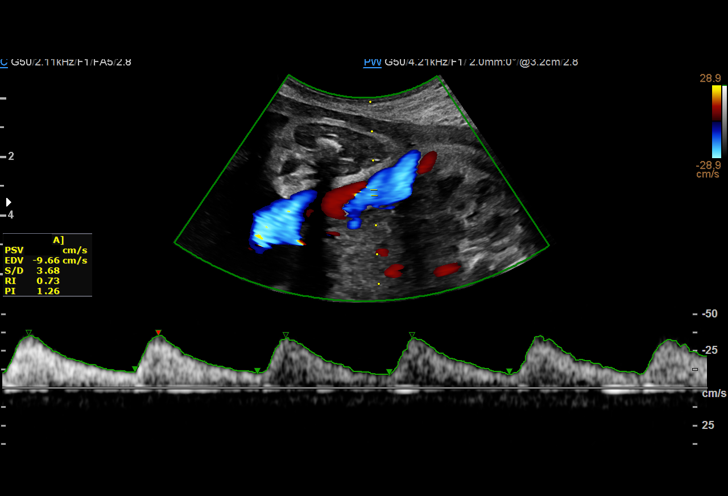
[im 58/69]
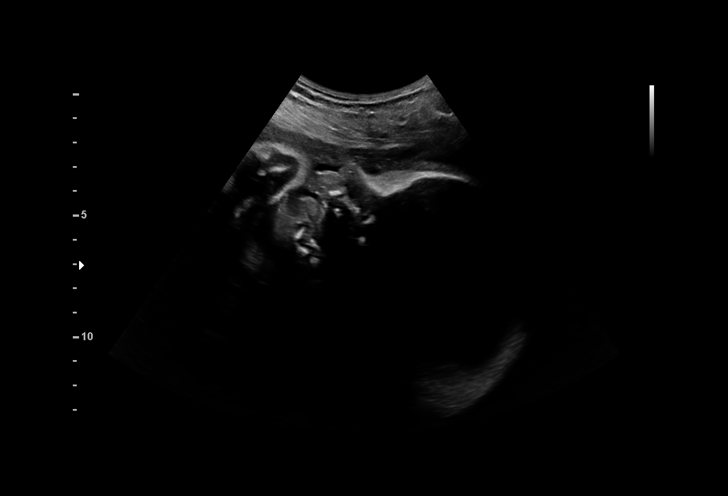
[im 63/69]
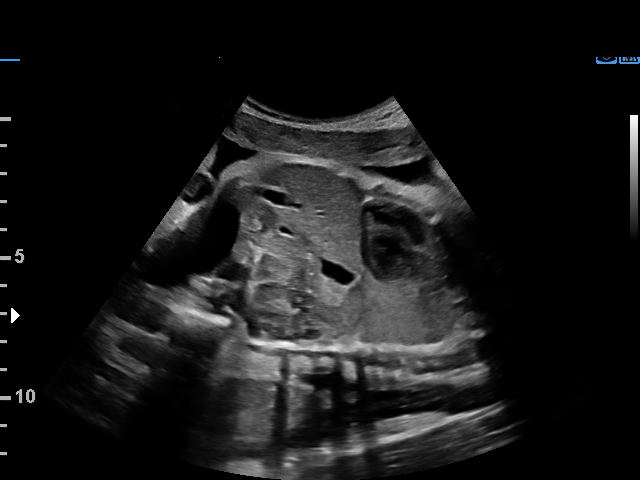
[im 69/69]
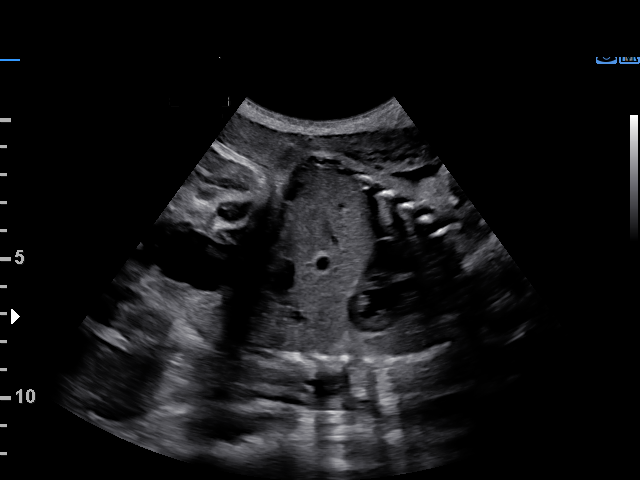

[14 of 28 positions shown; findings below may reference images not displayed]

1  ALIA TIGER           [id number]      [id number]     [id number]
Indications

33 weeks gestation of pregnancy
Encounter for other antenatal screening
follow-up
Late to prenatal care, third trimester
OB History

Gravidity:    4         Term:   2         SAB:   1
Living:       2
Fetal Evaluation

Num Of Fetuses:     1
Fetal Heart         142
Rate(bpm):
Cardiac Activity:   Observed
Presentation:       Cephalic
Placenta:           Posterior, above cervical os
P. Cord Insertion:  Previously Visualized

Amniotic Fluid
AFI FV:      Subjectively within normal limits

AFI Sum(cm)     %Tile       Largest Pocket(cm)
16.72           61

RUQ(cm)       RLQ(cm)       LUQ(cm)        LLQ(cm)
5.25
Biophysical Evaluation

Amniotic F.V:   Pocket => 2 cm two         F. Tone:        Observed
planes
F. Movement:    Observed                   Score:          [DATE]
F. Breathing:   Observed
Biometry

BPD:      87.1  mm     G. Age:  35w 1d         89  %    CI:        77.94   %    70 - 86
FL/HC:      17.3   %    19.9 -
HC:      312.2  mm     G. Age:  35w 0d         53  %    HC/AC:      1.13        0.96 -
AC:      275.3  mm     G. Age:  31w 4d          9  %    FL/BPD:     62.1   %    71 - 87
FL:       54.1  mm     G. Age:  28w 4d        < 3  %    FL/AC:      19.7   %    20 - 24

Est. FW:    9135  gm    3 lb 13 oz      21  %
Gestational Age

LMP:           33w 3d        Date:  09/10/15                 EDD:   06/16/16
U/S Today:     32w 4d                                        EDD:   06/22/16
Best:          33w 3d     Det. By:  LMP  (09/10/15)          EDD:   06/16/16
Anatomy

Cranium:               Appears normal         Aortic Arch:            Previously seen
Cavum:                 Appears normal         Ductal Arch:            Previously seen
Ventricles:            Appears normal         Diaphragm:              Appears normal
Choroid Plexus:        Previously seen        Stomach:                Appears normal, left
sided
Cerebellum:            Previously seen        Abdomen:                Appears normal
Posterior Fossa:       Previously seen        Abdominal Wall:         Previously seen
Nuchal Fold:           Previously seen        Cord Vessels:           Previously seen
Face:                  Orbits previously      Kidneys:                Appear normal
seen
Lips:                  Appears normal         Bladder:                Appears normal
Thoracic:              Appears normal         Spine:                  Previously seen
Heart:                 Appears normal         Upper Extremities:      Previously seen
(4CH, axis, and situs
RVOT:                  Appears normal         Lower Extremities:      Previously seen
LVOT:                  Appears normal

Other:  Fetus appears to be a male. Heels previously seen. Technically
difficult due to fetal position.
Doppler - Fetal Vessels

Umbilical Artery
S/D     %tile     RI              PI                     ADFV    RDFV
4.83    > 97.5  0.76             1.35                        No      No

Impression

SIUP at 44w4d
active singleton fetus
EFW 21st%, AC 9th%'le
interval growth is lagging and HC/AC 1.13 represents
asymmetric fetal growth pattern
UA Doppler studies demonstrate S/D >2SD above the mean
(abnormal)
no absent or reverse in diastolic flow
constellation is consistent with asymmetric IUGR complicated
by abnormal UA Doppler S/D
BPP
normal AFI
no previa demonstrated
Recommendations

Asymmetric IUGR complicated by abnormal UA Doppler
studies:
1. BMZ today and tomorrow (arranged in our unit);
2. weekly UA Doppler/BPP/AFI
3. provided Doppler and BPP are reassuring in the interim,
delivery at 36-37 weeks for complicated IUGR.

## 2018-05-27 ENCOUNTER — Telehealth: Payer: Self-pay | Admitting: Obstetrics

## 2018-06-02 ENCOUNTER — Other Ambulatory Visit: Payer: Self-pay

## 2018-06-02 ENCOUNTER — Ambulatory Visit (INDEPENDENT_AMBULATORY_CARE_PROVIDER_SITE_OTHER): Payer: Medicaid Other | Admitting: Obstetrics

## 2018-06-02 ENCOUNTER — Encounter: Payer: Self-pay | Admitting: Obstetrics

## 2018-06-02 VITALS — Ht 61.0 in | Wt 145.0 lb

## 2018-06-02 DIAGNOSIS — Z3042 Encounter for surveillance of injectable contraceptive: Secondary | ICD-10-CM | POA: Diagnosis not present

## 2018-06-02 DIAGNOSIS — N939 Abnormal uterine and vaginal bleeding, unspecified: Secondary | ICD-10-CM | POA: Diagnosis not present

## 2018-06-02 MED ORDER — NORETHINDRONE-ETH ESTRADIOL 1-35 MG-MCG PO TABS: 1.0000 | ORAL_TABLET | Freq: Every day | ORAL | 1 refills | Status: DC

## 2018-06-02 NOTE — Progress Notes (Signed)
    TELEHEALTH GYNECOLOGY VIRTUAL VIDEO VISIT ENCOUNTER NOTE  Provider location: [facility] [place]   I connected with Cindy Pearson on 06/02/18 at 10:45 AM EDT by WebEx OB MyChart Video Encounter at home and verified that I am speaking with the correct person using two identifiers.   I discussed the limitations, risks, security and privacy concerns of performing an evaluation and management service by telephone and the availability of in person appointments. I also discussed with the patient that there may be a patient responsible charge related to this service. The patient expressed understanding and agreed to proceed.   History:  Cindy Pearson is a 23 y.o. [phone] female being evaluated today for heavy vaginal bleeding . She denies any abnormal vaginal discharge, bleeding, pelvic pain or other concerns.       Past Medical History:  Diagnosis Date  . Asthma    Albuterol INH prn  . Blood transfusion without reported diagnosis   . Chlamydia 2012   hasn't picked up rx, not treated yet  . 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 Preterm labor 2011   Past Surgical History:  Procedure Laterality Date  . CESAREAN SECTION    . CESAREAN SECTION N/A 05/26/2016   Procedure: CESAREAN SECTION;  Surgeon: Lazaro Arms, MD;  Location: [facility] BIRTHING SUITES;  Service: Obstetrics;  Laterality: N/A;  . CESAREAN SECTION N/A 02/05/2018   Procedure: CESAREAN SECTION;  Surgeon: Conan Bowens, MD;  Location: [facility] BIRTHING SUITES;  Service: Obstetrics;  Laterality: N/A;  . TONSILLECTOMY AND ADENOIDECTOMY     The following portions of the patient's history were reviewed and updated as appropriate: allergies, current medications, past family history, past medical history, past social history, past surgical history and problem list.   Health Maintenance:  Normal pap and negative HRHPV on 09-03-2017.    Review of Systems:  Pertinent items noted in HPI and remainder of comprehensive ROS  otherwise negative.  Physical Exam:   General:  Alert, oriented and cooperative. Patient appears to be in no acute distress.  Mental Status: Normal mood and affect. Normal behavior. Normal judgment and thought content.   Respiratory: Normal respiratory effort, no problems with respiration noted  Rest of physical exam deferred due to type of encounter  Labs and Imaging No results found for this or any previous visit (from the past 336 hour(s)). No results found.     Assessment and Plan:     1. Abnormal uterine bleeding (AUB) Rx: - norethindrone-ethinyl estradiol 1/35 (ORTHO-NOVUM 1/35, 28,) tablet; Take 1 tablet by mouth daily.  Dispense: 1 Package; Refill: 1  2. Encounter for surveillance of injectable contraceptive - on Depo Provera since February 2020 - had a C/S 02-05-2018.  Bottle feeding       I discussed the assessment and treatment plan with the patient. The patient was provided an opportunity to ask questions and all were answered. The patient agreed with the plan and demonstrated an understanding of the instructions.   The patient was advised to call back or seek an in-person evaluation/go to the ED if the symptoms worsen or if the condition fails to improve as anticipated.  I provided 10 minutes of face-to-face time during this encounter.   Coral Ceo, MD [facility], [facility] 06-02-2018

## 2018-06-02 NOTE — Progress Notes (Signed)
GYN/Webex. C/o AUB since 05/01/2018, pain 6-7/10. Changing overnight pads every 1.5 hours.

## 2018-07-20 ENCOUNTER — Ambulatory Visit (HOSPITAL_COMMUNITY)
Admission: EM | Admit: 2018-07-20 | Discharge: 2018-07-20 | Disposition: A | Discharge status: Home / Self Care | Payer: Medicaid Other | Attending: Urgent Care | Admitting: Urgent Care

## 2018-07-20 ENCOUNTER — Encounter (HOSPITAL_COMMUNITY): Payer: Self-pay

## 2018-07-20 ENCOUNTER — Other Ambulatory Visit: Payer: Self-pay

## 2018-07-20 DIAGNOSIS — Z202 Contact with and (suspected) exposure to infections with a predominantly sexual mode of transmission: Secondary | ICD-10-CM | POA: Insufficient documentation

## 2018-07-20 LAB — POCT PREGNANCY, URINE: Preg Test, Ur: NEGATIVE

## 2018-07-20 MED ORDER — CEFTRIAXONE SODIUM 250 MG IJ SOLR
INTRAMUSCULAR | Status: AC
  Filled 2018-07-20: qty 250

## 2018-07-20 MED ORDER — AZITHROMYCIN 250 MG PO TABS
ORAL_TABLET | ORAL | Status: AC
  Filled 2018-07-20: qty 4

## 2018-07-20 MED ORDER — LIDOCAINE HCL 2 % IJ SOLN
INTRAMUSCULAR | Status: AC
  Filled 2018-07-20: qty 20

## 2018-07-20 MED ORDER — AZITHROMYCIN 250 MG PO TABS
1000.0000 mg | ORAL_TABLET | Freq: Once | ORAL | Status: AC
  Administered 2018-07-20: 1000 mg via ORAL

## 2018-07-20 MED ORDER — CEFTRIAXONE SODIUM 250 MG IJ SOLR
250.0000 mg | Freq: Once | INTRAMUSCULAR | Status: AC
  Administered 2018-07-20: 250 mg via INTRAMUSCULAR

## 2018-07-20 NOTE — ED Provider Notes (Signed)
MRN: [id number] DOB: 17-Aug-1995  Subjective:   Cindy Pearson is a 23 y.o. female presenting for STI check. Her sex partner noticed penile discharge this morning. LMP was 05/03/2018. Stopped taking Depo injection in April due to side effects.  She was given OCP but stopped taking this after a month.  No current facility-administered medications for this encounter.   Current Outpatient Medications:  .  docusate sodium (COLACE) 100 MG capsule, Take 1 capsule (100 mg total) by mouth 2 (two) times daily., Disp: 60 capsule, Rfl: 5 .  enalapril (VASOTEC) 10 MG tablet, Take 1 tablet (10 mg total) by mouth daily., Disp: 30 tablet, Rfl: 3 .  ibuprofen (ADVIL,MOTRIN) 600 MG tablet, Take 1 tablet (600 mg total) by mouth every 6 (six) hours as needed for mild pain or moderate pain., Disp: 30 tablet, Rfl: 0 .  medroxyPROGESTERone (DEPO-PROVERA) 150 MG/ML injection, Inject 1 mL (150 mg total) into the muscle every 3 (three) months., Disp: 1 mL, Rfl: 3 .  norethindrone (AYGESTIN) 5 MG tablet, Take 1 tablet (5 mg total) by mouth daily., Disp: 30 tablet, Rfl: 0 .  norethindrone-ethinyl estradiol 1/35 (Cook 1/35, 28,) tablet, Take 1 tablet by mouth daily., Disp: 1 Package, Rfl: 1 .  oxyCODONE-acetaminophen (PERCOCET/ROXICET) 5-325 MG tablet, Take 1-2 tablets by mouth every 6 (six) hours as needed for moderate pain., Disp: 20 tablet, Rfl: 0 .  polyethylene glycol (MIRALAX) packet, Take 17 g by mouth at bedtime., Disp: 30 each, Rfl: 5   Allergies  Allergen Reactions  . Penicillins Hives    Patient states that she is allergic to all penicillins. Has patient had a PCN reaction causing immediate rash, facial/tongue/throat swelling, SOB or lightheadedness with hypotension: YES Has patient had a PCN reaction causing severe rash involving mucus membranes or skin necrosis: NO Has patient had a PCN reaction that required hospitalization NO Has patient had a PCN reaction occurring within the last 10 years:NO If all  of the above answers are "NO", then may proceed with Cephalosporin use.    Past Medical History:  Diagnosis Date  . Asthma    Albuterol INH prn  . Blood transfusion without reported diagnosis   . Chlamydia 2012   hasn't picked up rx, not treated yet  . 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 Preterm labor 2011     Past Surgical History:  Procedure Laterality Date  . CESAREAN SECTION    . CESAREAN SECTION N/A 05/26/2016   Procedure: CESAREAN SECTION;  Surgeon: Florian Buff, MD;  Location: [place];  Service: Obstetrics;  Laterality: N/A;  . CESAREAN SECTION N/A 02/05/2018   Procedure: CESAREAN SECTION;  Surgeon: Sloan Leiter, MD;  Location: [place];  Service: Obstetrics;  Laterality: N/A;  . TONSILLECTOMY AND ADENOIDECTOMY      Review of Systems  Constitutional: Negative for chills and fever.  Respiratory: Negative for shortness of breath.   Cardiovascular: Negative for chest pain.  Gastrointestinal: Negative for abdominal pain, nausea and vomiting.  Genitourinary: Negative for dysuria, flank pain, frequency, hematuria and urgency.       Denies pelvic pain, genital rash, vaginal discharge.  Musculoskeletal: Negative for myalgias.  Skin: Negative for rash.  Neurological: Negative for dizziness and headaches.   Objective:   Vitals: BP 112/75 (BP Location: Left Arm)   Pulse 82   Temp 98.4 F (36.9 C) (Oral)   Resp 18   LMP 05/03/2018   SpO2 100%   Physical Exam Constitutional:  General: She is not in acute distress.    Appearance: Normal appearance. She is well-developed. She is not ill-appearing.  HENT:     Head: Normocephalic and atraumatic.     Nose: Nose normal.     Mouth/Throat:     Mouth: Mucous membranes are moist.     Pharynx: Oropharynx is clear.  Eyes:     General: No scleral icterus.    Extraocular Movements: Extraocular movements intact.     Pupils: Pupils are equal, round, and reactive to light.  Cardiovascular:      Rate and Rhythm: Normal rate.  Pulmonary:     Effort: Pulmonary effort is normal.  Skin:    General: Skin is warm and dry.  Neurological:     General: No focal deficit present.     Mental Status: She is alert and oriented to person, place, and time.  Psychiatric:        Mood and Affect: Mood normal.        Behavior: Behavior normal.    Results for orders placed or performed during the hospital encounter of 07/20/18 (from the past 24 hour(s))  Pregnancy, urine POC     Status: None   Collection Time: 07/20/18  3:36 PM  Result Value Ref Range   Preg Test, Ur NEGATIVE NEGATIVE    Assessment and Plan :   1. Exposure to STD     We will treat empirically per CDC guidelines for gonorrhea and chlamydia with IM ceftriaxone and azithromycin in clinic.  Counseled on safe sex practices. Counseled patient on potential for adverse effects with medications prescribed/recommended today, ER and return-to-clinic precautions discussed, patient verbalized understanding.    Wallis BambergMani, Hartley Urton, [place]PA-C 07/20/18 1545

## 2018-07-20 NOTE — ED Triage Notes (Signed)
Pt presents for STD Testing after possible exposure from partner; pt states partner has been complaining of yellow discharge.

## 2018-07-21 ENCOUNTER — Other Ambulatory Visit (HOSPITAL_COMMUNITY): Payer: Self-pay | Admitting: Urgent Care

## 2018-07-21 LAB — CERVICOVAGINAL ANCILLARY ONLY
Bacterial vaginitis: POSITIVE — AB
Candida vaginitis: NEGATIVE
Chlamydia: POSITIVE — AB
Neisseria Gonorrhea: NEGATIVE
Trichomonas: POSITIVE — AB

## 2018-07-21 MED ORDER — METRONIDAZOLE 500 MG PO TABS: 500.0000 mg | ORAL_TABLET | Freq: Two times a day (BID) | ORAL | 0 refills | Status: DC

## 2019-05-26 ENCOUNTER — Ambulatory Visit (HOSPITAL_COMMUNITY)
Admission: EM | Admit: 2019-05-26 | Discharge: 2019-05-26 | Disposition: A | Discharge status: Home / Self Care | Payer: Medicaid Other | Attending: Family Medicine | Admitting: Family Medicine

## 2019-05-26 ENCOUNTER — Encounter (HOSPITAL_COMMUNITY): Payer: Self-pay

## 2019-05-26 ENCOUNTER — Other Ambulatory Visit: Payer: Self-pay

## 2019-05-26 DIAGNOSIS — Z3201 Encounter for pregnancy test, result positive: Secondary | ICD-10-CM | POA: Diagnosis not present

## 2019-05-26 LAB — POC URINE PREG, ED: Preg Test, Ur: POSITIVE — AB

## 2019-05-26 MED ORDER — PRENATAL COMPLETE 14-0.4 MG PO TABS: 1.0000 | ORAL_TABLET | Freq: Every day | ORAL | 1 refills | Status: DC

## 2019-05-26 MED ORDER — ENALAPRIL MALEATE 10 MG PO TABS: 10.0000 mg | ORAL_TABLET | Freq: Every day | ORAL | 1 refills | Status: DC

## 2019-05-26 NOTE — Discharge Instructions (Addendum)
Please DO NOT take the enalipril with positive pregnancy test  Please schedule your initial pre natal visit  Please take the pre natal vitamins.

## 2019-05-26 NOTE — ED Provider Notes (Signed)
[facility]    CSN: [id number] Arrival date & time: 05/26/19  1830      History   Chief Complaint Chief Complaint  Patient presents with  . pregnancy    HPI RILLA BUCKMAN is a 24 y.o. female.   She is here for pregnancy test.  Denies any chance of having sex transmitted infection.  No home positive pregnancy test.  She has run out of her hypertensive medication.  HPI  Past Medical History:  Diagnosis Date  . Asthma    Albuterol INH prn  . Blood transfusion without reported diagnosis   . Chlamydia 2012   hasn't picked up rx, not treated yet  . 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 Preterm labor 2011    Patient Active Problem List   Diagnosis Date Noted  . Postpartum severe preeclampsia 02/13/2018  . Supervision of other normal pregnancy, antepartum 09/03/2017  . Chlamydia 06/13/2016  . S/P cesarean section 05/26/2016    Past Surgical History:  Procedure Laterality Date  . CESAREAN SECTION    . CESAREAN SECTION N/A 05/26/2016   Procedure: CESAREAN SECTION;  Surgeon: Lazaro Arms, MD;  Location: [facility] BIRTHING SUITES;  Service: Obstetrics;  Laterality: N/A;  . CESAREAN SECTION N/A 02/05/2018   Procedure: CESAREAN SECTION;  Surgeon: Conan Bowens, MD;  Location: [facility] BIRTHING SUITES;  Service: Obstetrics;  Laterality: N/A;  . TONSILLECTOMY AND ADENOIDECTOMY      OB History    Gravida  5   Para  4   Term  4   Preterm  0   AB  1   Living  4     SAB  1   TAB  0   Ectopic  0   Multiple  0   Live Births  4            Home Medications    Prior to Admission medications   Medication Sig Start Date End Date Taking? Authorizing Provider  docusate sodium (COLACE) 100 MG capsule Take 1 capsule (100 mg total) by mouth 2 (two) times daily. 02/19/18   Brock Bad, MD  enalapril (VASOTEC) 10 MG tablet Take 1 tablet (10 mg total) by mouth daily. 05/26/19   Myra Rude, MD  ibuprofen (ADVIL,MOTRIN) 600 MG tablet Take 1 tablet (600 mg  total) by mouth every 6 (six) hours as needed for mild pain or moderate pain. 02/07/18   Arabella Merles, CNM  medroxyPROGESTERone (DEPO-PROVERA) 150 MG/ML injection Inject 1 mL (150 mg total) into the muscle every 3 (three) months. 02/19/18   Brock Bad, MD  metroNIDAZOLE (FLAGYL) 500 MG tablet Take 1 tablet (500 mg total) by mouth 2 (two) times daily with a meal. DO NOT CONSUME ALCOHOL WHILE TAKING THIS MEDICATION. 07/21/18   Wallis Bamberg, PA-C  norethindrone (AYGESTIN) 5 MG tablet Take 1 tablet (5 mg total) by mouth daily. 05/12/18   Brock Bad, MD  norethindrone-ethinyl estradiol 1/35 (ORTHO-NOVUM 1/35, 28,) tablet Take 1 tablet by mouth daily. 06/02/18   Brock Bad, MD  oxyCODONE-acetaminophen (PERCOCET/ROXICET) 5-325 MG tablet Take 1-2 tablets by mouth every 6 (six) hours as needed for moderate pain. 02/19/18   Brock Bad, MD  polyethylene glycol [facility]) packet Take 17 g by mouth at bedtime. 02/19/18   Brock Bad, MD  Prenatal Vit-Fe Fumarate-FA (PRENATAL COMPLETE) 14-0.4 MG TABS Take 1 tablet by mouth daily. 05/26/19   Myra Rude, MD  Family History Family History  Problem Relation Age of Onset  . Diabetes Maternal Grandmother   . Heart disease Maternal Grandfather   . Cancer Maternal Grandfather   . Heart disease Paternal Grandfather   . Cancer Father     Social History Social History   Tobacco Use  . Smoking status: Current Every Day Smoker  . Smokeless tobacco: Never Used  . Tobacco comment: started again 02/11/2018  Substance Use Topics  . Alcohol use: Not Currently    Comment: occasional  . Drug use: Yes    Types: Marijuana    Comment: last smoked in September 2019     Allergies   Penicillins   Review of Systems Review of Systems  See HPI  Physical Exam Triage Vital Signs ED Triage Vitals  Enc Vitals Group     BP 05/26/19 1929 (!) 101/53     Pulse Rate 05/26/19 1929 85     Resp 05/26/19 1929 16     Temp 05/26/19 1929  99.5 F (37.5 C)     Temp Source 05/26/19 1929 Oral     SpO2 05/26/19 1929 100 %     Weight --      Height --      Head Circumference --      Peak Flow --      Pain Score 05/26/19 1928 0     Pain Loc --      Pain Edu? --      Excl. in [place]? --    No data found.  Updated Vital Signs BP (!) 101/53 (BP Location: Left Arm)   Pulse 85   Temp 99.5 F (37.5 C) (Oral)   Resp 16   LMP 03/29/2019 (Approximate)   SpO2 100%   Visual Acuity Right Eye Distance:   Left Eye Distance:   Bilateral Distance:    Right Eye Near:   Left Eye Near:    Bilateral Near:     Physical Exam Gen: NAD, alert, cooperative with exam, well-appearing ENT: normal lips, normal nasal mucosa,  Eye: normal EOM, normal conjunctiva and lids CV:  no edema,  Resp: no accessory muscle use, non-labored,  Skin: no rashes, no areas of induration  Neuro: normal tone, normal sensation to touch Psych:  normal insight, alert and oriented   UC Treatments / Results  Labs (all labs ordered are listed, but only abnormal results are displayed) Labs Reviewed  POC URINE PREG, ED - Abnormal; Notable for the following components:      Result Value   Preg Test, Ur POSITIVE (*)    All other components within normal limits    EKG   Radiology No results found.  Procedures Procedures (including critical care time)  Medications Ordered in UC Medications - No data to display  Initial Impression / Assessment and Plan / UC Course  I have reviewed the triage vital signs and the nursing notes.  Pertinent labs & imaging results that were available during my care of the patient were reviewed by me and considered in my medical decision making (see chart for details).     Ms. Burggraf is a 24 year old female that is presenting with a history of preeclampsia.  She takes enalapril as needed on a more regular basis if her pressure is elevated.  Her urine pregnancy was positive.  She was advised to stop the enalapril and not  fill it.  Prenatal vitamins were provided.  She was counseled to make her first prenatal visit.  Given indications  to follow-up return.  Final Clinical Impressions(s) / UC Diagnoses   Final diagnoses:  Positive pregnancy test     Discharge Instructions     Please DO NOT take the enalipril with positive pregnancy test  Please schedule your initial pre natal visit  Please take the pre natal vitamins.     ED Prescriptions    Medication Sig Dispense Auth. Provider   enalapril (VASOTEC) 10 MG tablet Take 1 tablet (10 mg total) by mouth daily. 30 tablet Myra Rude, MD   Prenatal Vit-Fe Fumarate-FA (PRENATAL COMPLETE) 14-0.4 MG TABS Take 1 tablet by mouth daily. 60 tablet Myra Rude, MD     PDMP not reviewed this encounter.   Myra Rude, MD 05/26/19 2017

## 2019-05-26 NOTE — ED Triage Notes (Signed)
Pt requests preg test. States LMP end of March  Request refill of enalapril

## 2019-06-11 ENCOUNTER — Ambulatory Visit: Payer: Medicaid Other

## 2019-06-11 DIAGNOSIS — Z3480 Encounter for supervision of other normal pregnancy, unspecified trimester: Secondary | ICD-10-CM

## 2019-06-11 NOTE — Progress Notes (Signed)
Called for nurse intake interview, no answer, unable to leave vm.

## 2019-06-15 ENCOUNTER — Encounter: Payer: Medicaid Other | Admitting: Student

## 2019-06-19 ENCOUNTER — Inpatient Hospital Stay (HOSPITAL_COMMUNITY)
Admission: AD | Admit: 2019-06-19 | Discharge: 2019-06-19 | Disposition: A | Discharge status: Home / Self Care | Payer: Medicaid Other | Attending: Family Medicine | Admitting: Family Medicine

## 2019-06-19 ENCOUNTER — Encounter (HOSPITAL_COMMUNITY): Payer: Self-pay | Admitting: Family Medicine

## 2019-06-19 ENCOUNTER — Inpatient Hospital Stay (HOSPITAL_COMMUNITY): Payer: Medicaid Other

## 2019-06-19 ENCOUNTER — Other Ambulatory Visit: Payer: Self-pay

## 2019-06-19 DIAGNOSIS — Z3491 Encounter for supervision of normal pregnancy, unspecified, first trimester: Secondary | ICD-10-CM

## 2019-06-19 DIAGNOSIS — O26891 Other specified pregnancy related conditions, first trimester: Secondary | ICD-10-CM | POA: Insufficient documentation

## 2019-06-19 DIAGNOSIS — R109 Unspecified abdominal pain: Secondary | ICD-10-CM | POA: Diagnosis not present

## 2019-06-19 DIAGNOSIS — R110 Nausea: Secondary | ICD-10-CM | POA: Diagnosis not present

## 2019-06-19 DIAGNOSIS — Z3A01 Less than 8 weeks gestation of pregnancy: Secondary | ICD-10-CM | POA: Diagnosis not present

## 2019-06-19 DIAGNOSIS — Z79899 Other long term (current) drug therapy: Secondary | ICD-10-CM | POA: Insufficient documentation

## 2019-06-19 DIAGNOSIS — O99511 Diseases of the respiratory system complicating pregnancy, first trimester: Secondary | ICD-10-CM | POA: Insufficient documentation

## 2019-06-19 DIAGNOSIS — J45909 Unspecified asthma, uncomplicated: Secondary | ICD-10-CM | POA: Diagnosis not present

## 2019-06-19 DIAGNOSIS — Z87891 Personal history of nicotine dependence: Secondary | ICD-10-CM | POA: Insufficient documentation

## 2019-06-19 DIAGNOSIS — O26899 Other specified pregnancy related conditions, unspecified trimester: Secondary | ICD-10-CM

## 2019-06-19 LAB — WET PREP, GENITAL
Sperm: NONE SEEN
Trich, Wet Prep: NONE SEEN
Yeast Wet Prep HPF POC: NONE SEEN

## 2019-06-19 LAB — URINALYSIS, ROUTINE W REFLEX MICROSCOPIC
Bilirubin Urine: NEGATIVE
Glucose, UA: NEGATIVE mg/dL
Hgb urine dipstick: NEGATIVE
Ketones, ur: NEGATIVE mg/dL
Leukocytes,Ua: NEGATIVE
Nitrite: NEGATIVE
Protein, ur: NEGATIVE mg/dL
Specific Gravity, Urine: 1.013 (ref 1.005–1.030)
pH: 7 (ref 5.0–8.0)

## 2019-06-19 LAB — CBC
HCT: 38.3 % (ref 36.0–46.0)
Hemoglobin: 12.7 g/dL (ref 12.0–15.0)
MCH: 28.7 pg (ref 26.0–34.0)
MCHC: 33.2 g/dL (ref 30.0–36.0)
MCV: 86.5 fL (ref 80.0–100.0)
Platelets: 165 10*3/uL (ref 150–400)
RBC: 4.43 MIL/uL (ref 3.87–5.11)
RDW: 12.9 % (ref 11.5–15.5)
WBC: 6.4 10*3/uL (ref 4.0–10.5)
nRBC: 0 % (ref 0.0–0.2)

## 2019-06-19 LAB — HCG, QUANTITATIVE, PREGNANCY: hCG, Beta Chain, Quant, S: 52721 m[IU]/mL — ABNORMAL HIGH (ref <5)

## 2019-06-19 MED ORDER — PROMETHAZINE HCL 25 MG PO TABS: 25.0000 mg | ORAL_TABLET | Freq: Once | ORAL | Status: DC

## 2019-06-19 MED ORDER — FAMOTIDINE 20 MG PO TABS: 20.0000 mg | ORAL_TABLET | Freq: Once | ORAL | 0 refills | Status: DC

## 2019-06-19 MED ORDER — FAMOTIDINE 20 MG PO TABS: 20.0000 mg | ORAL_TABLET | Freq: Once | ORAL | Status: DC

## 2019-06-19 MED ORDER — PROMETHAZINE HCL 25 MG PO TABS: 25.0000 mg | ORAL_TABLET | Freq: Once | ORAL | 0 refills | Status: DC

## 2019-06-19 NOTE — MAU Provider Note (Signed)
History     CSN: [id number]  Arrival date and time: 06/19/19 1511   First Provider Initiated Contact with Patient 06/19/19 1600      Chief Complaint  Patient presents with  . Abdominal Pain  . Nausea   HPI Cindy Pearson is a 24 y.o. [id number] at [redacted]w[redacted]d who presents with nausea and abdominal pain. She states the abdominal pain is a 3/10 and has been ongoing for weeks. She reports intermittent nausea since she found out she was pregnant and no vomiting. She has not taken any medicine for the nausea. She denies any vaginal bleeding or discharge. She has not been seen anywhere for the pregnancy yet.   OB History    Gravida  6   Para  4   Term  4   Preterm  0   AB  1   Living  4     SAB  1   TAB  0   Ectopic  0   Multiple  0   Live Births  4           Past Medical History:  Diagnosis Date  . Asthma    Albuterol INH prn  . Blood transfusion without reported diagnosis   . Chlamydia 2012   hasn't picked up rx, not treated yet  . 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 Preterm labor 2011    Past Surgical History:  Procedure Laterality Date  . CESAREAN SECTION    . CESAREAN SECTION N/A 05/26/2016   Procedure: CESAREAN SECTION;  Surgeon: Lazaro Arms, MD;  Location: [facility] BIRTHING SUITES;  Service: Obstetrics;  Laterality: N/A;  . CESAREAN SECTION N/A 02/05/2018   Procedure: CESAREAN SECTION;  Surgeon: Conan Bowens, MD;  Location: [facility] BIRTHING SUITES;  Service: Obstetrics;  Laterality: N/A;  . TONSILLECTOMY AND ADENOIDECTOMY      Family History  Problem Relation Age of Onset  . Diabetes Maternal Grandmother   . Heart disease Maternal Grandfather   . Cancer Maternal Grandfather   . Heart disease Paternal Grandfather   . Cancer Father     Social History   Tobacco Use  . Smoking status: Former Smoker    Types: Cigars    Quit date: 05/19/2019    Years since quitting: 0.0  . Smokeless tobacco: Never Used  . Tobacco comment: started again 02/11/2018   Vaping Use  . Vaping Use: Former  Substance Use Topics  . Alcohol use: Not Currently    Comment: occasional  . Drug use: Not Currently    Types: Marijuana    Comment: last smoked in May 2021    Allergies:  Allergies  Allergen Reactions  . Penicillins Hives    Patient states that she is allergic to all penicillins. Has patient had a PCN reaction causing immediate rash, facial/tongue/throat swelling, SOB or lightheadedness with hypotension: YES Has patient had a PCN reaction causing severe rash involving mucus membranes or skin necrosis: NO Has patient had a PCN reaction that required hospitalization NO Has patient had a PCN reaction occurring within the last 10 years:NO If all of the above answers are "NO", then may proceed with Cephalosporin use.    Medications Prior to Admission  Medication Sig Dispense Refill Last Dose  . docusate sodium (COLACE) 100 MG capsule Take 1 capsule (100 mg total) by mouth 2 (two) times daily. 60 capsule 5   . enalapril (VASOTEC) 10 MG tablet Take 1 tablet (10 mg total) by mouth daily.  30 tablet 1   . ibuprofen (ADVIL,MOTRIN) 600 MG tablet Take 1 tablet (600 mg total) by mouth every 6 (six) hours as needed for mild pain or moderate pain. 30 tablet 0   . medroxyPROGESTERone (DEPO-PROVERA) 150 MG/ML injection Inject 1 mL (150 mg total) into the muscle every 3 (three) months. 1 mL 3   . metroNIDAZOLE (FLAGYL) 500 MG tablet Take 1 tablet (500 mg total) by mouth 2 (two) times daily with a meal. DO NOT CONSUME ALCOHOL WHILE TAKING THIS MEDICATION. 14 tablet 0   . norethindrone (AYGESTIN) 5 MG tablet Take 1 tablet (5 mg total) by mouth daily. 30 tablet 0   . norethindrone-ethinyl estradiol 1/35 (ORTHO-NOVUM 1/35, 28,) tablet Take 1 tablet by mouth daily. 1 Package 1   . oxyCODONE-acetaminophen (PERCOCET/ROXICET) 5-325 MG tablet Take 1-2 tablets by mouth every 6 (six) hours as needed for moderate pain. 20 tablet 0   . polyethylene glycol (MIRALAX) packet Take 17  g by mouth at bedtime. 30 each 5   . Prenatal Vit-Fe Fumarate-FA (PRENATAL COMPLETE) 14-0.4 MG TABS Take 1 tablet by mouth daily. 60 tablet 1     Review of Systems  Constitutional: Negative.  Negative for fatigue and fever.  HENT: Negative.   Respiratory: Negative.  Negative for shortness of breath.   Cardiovascular: Negative.  Negative for chest pain.  Gastrointestinal: Positive for abdominal pain and nausea. Negative for constipation, diarrhea and vomiting.  Genitourinary: Negative.  Negative for dysuria, vaginal bleeding and vaginal discharge.  Neurological: Negative.  Negative for dizziness and headaches.   Physical Exam   Blood pressure 112/83, pulse 88, temperature 99.3 F (37.4 C), resp. rate 18, last menstrual period 03/29/2019, not currently breastfeeding.  Physical Exam  Nursing note and vitals reviewed. Constitutional: She is oriented to person, place, and time. She appears well-developed. No distress.  HENT:  Head: Normocephalic.  Eyes: Pupils are equal, round, and reactive to light.  Cardiovascular: Normal rate, regular rhythm and normal heart sounds.  Respiratory: Effort normal and breath sounds normal. No respiratory distress.  GI: Soft. Bowel sounds are normal. She exhibits no distension. There is no abdominal tenderness.  Neurological: She is alert and oriented to person, place, and time.  Skin: Skin is warm and dry.  Psychiatric: Her behavior is normal. Judgment and thought content normal.    MAU Course  Procedures Results for orders placed or performed during the hospital encounter of 06/19/19 (from the past 24 hour(s))  CBC     Status: None   Collection Time: 06/19/19  3:42 PM  Result Value Ref Range   WBC 6.4 4.0 - 10.5 K/uL   RBC 4.43 3.87 - 5.11 MIL/uL   Hemoglobin 12.7 12.0 - 15.0 g/dL   HCT 95.1 36 - 46 %   MCV 86.5 80.0 - 100.0 fL   MCH 28.7 26.0 - 34.0 pg   MCHC 33.2 30.0 - 36.0 g/dL   RDW 88.4 16.6 - 06.3 %   Platelets 165 150 - 400 K/uL    nRBC 0.0 0.0 - 0.2 %  Urinalysis, Routine w reflex microscopic     Status: None   Collection Time: 06/19/19  3:43 PM  Result Value Ref Range   Color, Urine YELLOW YELLOW   APPearance CLEAR CLEAR   Specific Gravity, Urine 1.013 1.005 - 1.030   pH 7.0 5.0 - 8.0   Glucose, UA NEGATIVE NEGATIVE mg/dL   Hgb urine dipstick NEGATIVE NEGATIVE   Bilirubin Urine NEGATIVE NEGATIVE   Ketones, ur  NEGATIVE NEGATIVE mg/dL   Protein, ur NEGATIVE NEGATIVE mg/dL   Nitrite NEGATIVE NEGATIVE   Leukocytes,Ua NEGATIVE NEGATIVE  Wet prep, genital     Status: Abnormal   Collection Time: 06/19/19  4:10 PM   Specimen: Vaginal  Result Value Ref Range   Yeast Wet Prep HPF POC NONE SEEN NONE SEEN   Trich, Wet Prep NONE SEEN NONE SEEN   Clue Cells Wet Prep HPF POC PRESENT (A) NONE SEEN   WBC, Wet Prep HPF POC FEW (A) NONE SEEN   Sperm NONE SEEN    US OB LESS THAN 14 WEEKS WITH OB TRANSVAGINAL  Result Date: 06/19/2019 CLINICAL DATA:  Pelvic pain, 11 week pregnancy EXAM: OBSTETRIC <14 WK [place] AND TRANSVAGINAL OB US TECHNIQUE: Both transabdominal and transvaginal ultrasound examinations were performed for complete evaluation of the gestation as well as the maternal uterus, adnexal regions, and pelvic cul-de-sac. Transvaginal technique was performed to assess early pregnancy. COMPARISON:  None. FINDINGS: Intrauterine gestational sac: Single Yolk sac:  Present Embryo:  Present Cardiac Activity: Present Heart Rate: 160 bpm CRL:  14.6 mm   7 w   5 d                  [place] EDC: 01/31/2020 Subchorionic hemorrhage: Small 1.8 cm subchorionic hemorrhage is noted. Maternal uterus/adnexae: Corpus luteum cyst is noted within the left ovary. No other focal abnormality is noted. IMPRESSION: Single live intrauterine gestation at 7 weeks 5 days. Small subchorionic hemorrhage is noted. Electronically Signed   By: Inez Catalina M.D.   On: 06/19/2019 16:51    MDM UA, UPT CBC, HCG ABO/Rh- B Pos Wet prep and gc/chlamydia US OB Comp Less 14  weeks with Transvaginal  Reviewed results with patient and patient verbalized understanding. Reviewed clue cells on wet prep, indicting BV. Patient denies any discharge or foul odor and declines treatment at this time.   Assessment and Plan   1. Normal intrauterine pregnancy on prenatal ultrasound in first trimester   2. Abdominal pain affecting pregnancy   3. [redacted] weeks gestation of pregnancy    -Discharge home in stable condition -Rx for phenergan and pepcid sent to patient's pharmacy -First trimester precautions discussed -Patient advised to follow-up with OB as scheduled for prenatal care -Patient may return to MAU as needed or if her condition were to change or worsen   [place] 06/19/2019, 4:00 PM

## 2019-06-19 NOTE — MAU Note (Signed)
Pt reports she had pregnancy confirmed at urgent care 3 weeks ago. Has been having lower abd pain on and off for several weeks and n/v. Has been taking old nausea medication for nausea with relief but has run out of it now. Missed appointment on 6/8. New appointment not until mid July. Wants to make sure everything is ok

## 2019-06-19 NOTE — Discharge Instructions (Signed)

## 2019-06-21 LAB — GC/CHLAMYDIA PROBE AMP (~~LOC~~) NOT AT ARMC
Chlamydia: POSITIVE — AB
Comment: NEGATIVE
Comment: NORMAL
Neisseria Gonorrhea: NEGATIVE

## 2019-06-28 ENCOUNTER — Other Ambulatory Visit: Payer: Self-pay

## 2019-06-28 MED ORDER — DOXYLAMINE-PYRIDOXINE 10-10 MG PO TBEC
1.0000 | DELAYED_RELEASE_TABLET | Freq: Every day | ORAL | 0 refills | Status: DC
Start: 2019-06-28 — End: 2019-07-29

## 2019-06-28 NOTE — Progress Notes (Signed)
Pt left vm stating that she is still having nausea and vomiting after taking promethazine. I spoke with Ms. Willadsen notifying her of the medication changes. -EH/RMA

## 2019-07-05 ENCOUNTER — Telehealth: Payer: Self-pay

## 2019-07-05 ENCOUNTER — Other Ambulatory Visit: Payer: Self-pay

## 2019-07-05 DIAGNOSIS — A749 Chlamydial infection, unspecified: Secondary | ICD-10-CM

## 2019-07-05 MED ORDER — AZITHROMYCIN 500 MG PO TABS: 1000.0000 mg | ORAL_TABLET | Freq: Once | ORAL | 0 refills | Status: AC

## 2019-07-05 NOTE — Telephone Encounter (Signed)
Pt tested + for CT at hospital  Pt states she did not receive treatment  Rx sent per protocol  Pt made aware No unprotected intercourse with partner Partner needs to get Treatment.  Pt voiced understanding

## 2019-07-05 NOTE — Progress Notes (Signed)
TC from pt regarding CT treatment Rx  Rx sent to pt pharmacy hospital tried to reach pt by phone was not able to to make contact.

## 2019-07-16 ENCOUNTER — Encounter: Payer: Medicaid Other | Admitting: Women's Health

## 2019-07-29 ENCOUNTER — Other Ambulatory Visit: Payer: Self-pay

## 2019-07-29 ENCOUNTER — Encounter: Payer: Self-pay | Admitting: Obstetrics

## 2019-07-29 ENCOUNTER — Other Ambulatory Visit (HOSPITAL_COMMUNITY)
Admission: RE | Admit: 2019-07-29 | Discharge: 2019-07-29 | Disposition: A | Discharge status: Home / Self Care | Payer: Medicaid Other | Source: Ambulatory Visit | Attending: Obstetrics | Admitting: Obstetrics

## 2019-07-29 ENCOUNTER — Ambulatory Visit (INDEPENDENT_AMBULATORY_CARE_PROVIDER_SITE_OTHER): Payer: Medicaid Other | Admitting: Obstetrics

## 2019-07-29 VITALS — BP 106/72 | HR 84 | Wt 139.0 lb

## 2019-07-29 DIAGNOSIS — Z3490 Encounter for supervision of normal pregnancy, unspecified, unspecified trimester: Secondary | ICD-10-CM | POA: Insufficient documentation

## 2019-07-29 DIAGNOSIS — O219 Vomiting of pregnancy, unspecified: Secondary | ICD-10-CM | POA: Diagnosis not present

## 2019-07-29 DIAGNOSIS — O98311 Other infections with a predominantly sexual mode of transmission complicating pregnancy, first trimester: Secondary | ICD-10-CM

## 2019-07-29 DIAGNOSIS — Z3A13 13 weeks gestation of pregnancy: Secondary | ICD-10-CM

## 2019-07-29 DIAGNOSIS — N760 Acute vaginitis: Secondary | ICD-10-CM

## 2019-07-29 DIAGNOSIS — A749 Chlamydial infection, unspecified: Secondary | ICD-10-CM | POA: Diagnosis not present

## 2019-07-29 DIAGNOSIS — B9689 Other specified bacterial agents as the cause of diseases classified elsewhere: Secondary | ICD-10-CM

## 2019-07-29 MED ORDER — VITAFOL GUMMIES 3.33-0.333-34.8 MG PO CHEW: 3.0000 | CHEWABLE_TABLET | Freq: Every day | ORAL | 11 refills | Status: DC

## 2019-07-29 MED ORDER — BLOOD PRESSURE MONITOR KIT: 1.0000 | PACK | 0 refills | Status: AC

## 2019-07-29 MED ORDER — DOXYLAMINE-PYRIDOXINE 10-10 MG PO TBEC: DELAYED_RELEASE_TABLET | ORAL | 5 refills | Status: DC

## 2019-07-29 NOTE — Progress Notes (Signed)
Subjective:    Cindy Pearson is being seen today for her first obstetrical visit.  This is a planned pregnancy. She is at 38w3dgestation. Her obstetrical history is significant for pre-eclampsia. Relationship with FOB: significant other, living together. Patient does intend to breast feed. Pregnancy history fully reviewed.  The information documented in the HPI was reviewed and verified.  Menstrual History: OB History    Gravida  6   Para  4   Term  4   Preterm  0   AB  1   Living  4     SAB  1   TAB  0   Ectopic  0   Multiple  0   Live Births  4            Patient's last menstrual period was 03/29/2019 (approximate).    Past Medical History:  Diagnosis Date  . Asthma    Albuterol INH prn  . Blood transfusion without reported diagnosis   . Chlamydia 2012   hasn't picked up rx, not treated yet  . 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 Preterm labor 2011    Past Surgical History:  Procedure Laterality Date  . CESAREAN SECTION    . CESAREAN SECTION N/A 05/26/2016   Procedure: CESAREAN SECTION;  Surgeon: EFlorian Buff MD;  [place] [facility]  Service: Obstetrics;  Laterality: N/A;  . CESAREAN SECTION N/A 02/05/2018   Procedure: CESAREAN SECTION;  Surgeon: DSloan Leiter MD;  [place] [facility]  Service: Obstetrics;  Laterality: N/A;  . TONSILLECTOMY AND ADENOIDECTOMY      (Not in a hospital admission)  Allergies  Allergen Reactions  . Penicillins Hives    Patient states that she is allergic to all penicillins. Has patient had a PCN reaction causing immediate rash, facial/tongue/throat swelling, SOB or lightheadedness with hypotension: YES Has patient had a PCN reaction causing severe rash involving mucus membranes or skin necrosis: NO Has patient had a PCN reaction that required hospitalization NO Has patient had a PCN reaction occurring within the last 10 years:NO If all of the above answers are "NO", then may proceed with  Cephalosporin use.    Social History   Tobacco Use  . Smoking status: Former Smoker    Types: Cigars    Quit date: 05/19/2019    Years since quitting: 0.1  . Smokeless tobacco: Never Used  . Tobacco comment: started again 02/11/2018  Substance Use Topics  . Alcohol use: Not Currently    Comment: occasional    Family History  Problem Relation Age of Onset  . Diabetes Maternal Grandmother   . Heart disease Maternal Grandfather   . Cancer Maternal Grandfather   . Heart disease Paternal Grandfather   . Cancer Father      Review of Systems Constitutional: negative for weight loss Gastrointestinal: negative for vomiting Genitourinary:negative for genital lesions and vaginal discharge and dysuria Musculoskeletal:negative for back pain Behavioral/Psych: negative for abusive relationship, depression, illegal drug usage and tobacco use    Objective:    BP 106/72   Pulse 84   Wt 139 lb (63 kg)   LMP 03/29/2019 (Approximate)   BMI 26.26 kg/m  General Appearance:    Alert, cooperative, no distress, appears stated age  Head:    Normocephalic, without obvious abnormality, atraumatic  Eyes:    PERRL, conjunctiva/corneas clear, EOM's intact, fundi    benign, both eyes  Ears:    Normal TM's and external  ear canals, both ears  Nose:   Nares normal, septum midline, mucosa normal, no drainage    or sinus tenderness  Throat:   Lips, mucosa, and tongue normal; teeth and gums normal  Neck:   Supple, symmetrical, trachea midline, no adenopathy;    thyroid:  no enlargement/tenderness/nodules; no carotid   bruit or JVD  Back:     Symmetric, no curvature, ROM normal, no CVA tenderness  Lungs:     Clear to auscultation bilaterally, respirations unlabored  Chest Wall:    No tenderness or deformity   Heart:    Regular rate and rhythm, S1 and S2 normal, no murmur, rub   or gallop  Breast Exam:    No tenderness, masses, or nipple abnormality  Abdomen:     Soft, non-tender, bowel sounds active all  four quadrants,    no masses, no organomegaly  Genitalia:    Normal female without lesion, discharge or tenderness  Extremities:   Extremities normal, atraumatic, no cyanosis or edema  Pulses:   2+ and symmetric all extremities  Skin:   Skin color, texture, turgor normal, no rashes or lesions  Lymph nodes:   Cervical, supraclavicular, and axillary nodes normal  Neurologic:   CNII-XII intact, normal strength, sensation and reflexes    throughout      Lab Review Urine pregnancy test Labs reviewed yes Radiologic studies reviewed no Assessment:    Pregnancy at 48w3dweeks    Plan:     1. Encounter for supervision of normal pregnancy, antepartum, unspecified gravidity Rx: - CBC/D/Plt+RPR+Rh+ABO+Rub Ab... - Cervicovaginal ancillary only( [place]) - Cytology - PAP( [place]) - Culture, OB Urine - Enroll Patient in Babyscripts - Blood Pressure Monitor KIT; 1 Device by Does not apply route once a week. To be monitored Regularly at home.  Dispense: 1 kit; Refill: 0 - Prenatal Vit-Fe Phos-FA-Omega (VITAFOL GUMMIES) 3.33-0.333-34.8 MG CHEW; Chew 3 tablets by mouth daily before breakfast.  Dispense: 90 tablet; Refill: 11 - [place] OB COMP + 14 WK; Future  2. Nausea and vomiting during pregnancy prior to [redacted] weeks gestation Rx: - Doxylamine-Pyridoxine (DICLEGIS) 10-10 MG TBEC; 1 tab in AM, 1 tab mid afternoon 2 tabs at bedtime. Max dose 4 tabs daily.  Dispense: 100 tablet; Refill: 5  3. Chlamydia infection, treated - TOC done today  4. BV (bacterial vaginosis), treated   Prenatal vitamins.  Counseling provided regarding continued use of seat belts, cessation of alcohol consumption, smoking or use of illicit drugs; infection precautions i.e., influenza/TDAP immunizations, toxoplasmosis,CMV, parvovirus, listeria and varicella; workplace safety, exercise during pregnancy; routine dental care, safe medications, sexual activity, hot tubs, saunas, pools, travel, caffeine use, fish and  methlymercury, potential toxins, hair treatments, varicose veins Weight gain recommendations per IOM guidelines reviewed: underweight/BMI< 18.5--> gain 28 - 40 lbs; normal weight/BMI 18.5 - 24.9--> gain 25 - 35 lbs; overweight/BMI 25 - 29.9--> gain 15 - 25 lbs; obese/BMI >30->gain  11 - 20 lbs Problem list reviewed and updated. FIRST/CF mutation testing/NIPT/QUAD SCREEN/fragile X/Ashkenazi Jewish population testing/Spinal muscular atrophy discussed: requested. Role of ultrasound in pregnancy discussed; fetal survey: requested. Amniocentesis discussed: not indicated.   Meds ordered this encounter  Medications  . Blood Pressure Monitor KIT    Sig: 1 Device by Does not apply route once a week. To be monitored Regularly at home.    Dispense:  1 kit    Refill:  0  . Prenatal Vit-Fe Phos-FA-Omega (VITAFOL GUMMIES) 3.33-0.333-34.8 MG CHEW    Sig: Chew 3  tablets by mouth daily before breakfast.    Dispense:  90 tablet    Refill:  11  . Doxylamine-Pyridoxine (DICLEGIS) 10-10 MG TBEC    Sig: 1 tab in AM, 1 tab mid afternoon 2 tabs at bedtime. Max dose 4 tabs daily.    Dispense:  100 tablet    Refill:  5   Orders Placed This Encounter  Procedures  . Culture, OB Urine  . [place] MFM OB COMP + 14 WK    Standing Status:   Future    Standing Expiration Date:   07/28/2020    Order Specific Question:   Reason for Exam (SYMPTOM  OR DIAGNOSIS REQUIRED)    Answer:   Anatomy    Order Specific Question:   Preferred Location    Answer:   [facility] Ultrasound  . CBC/D/Plt+RPR+Rh+ABO+Rub Ab...    Follow up in 4 weeks. 50% of 25 min visit spent on counseling and coordination of care.    Shelly Bombard, MD 07/29/2019 3:42 PM

## 2019-07-29 NOTE — Progress Notes (Signed)
NOB   +CT on 06/19/19   Pt needs PNV's   Planned: Yes. FOB is involved.    Genetic Screening:  Yes wants to know gender.   Last Pap: 09/03/2017 WNL   CC: Nausea and vomiting pt has tried phenergan and Diclegis. Pt notes a red mole on left breast.  *Pt had post partum preeclampsia with last pregnancy.

## 2019-07-30 LAB — CBC/D/PLT+RPR+RH+ABO+RUB AB...
Antibody Screen: NEGATIVE
Basophils Absolute: 0 10*3/uL (ref 0.0–0.2)
Basos: 0 %
EOS (ABSOLUTE): 0.1 10*3/uL (ref 0.0–0.4)
Eos: 1 %
HCV Ab: <0.1 s/co ratio (ref 0.0–0.9)
HIV Screen 4th Generation wRfx: NONREACTIVE
Hematocrit: 37.8 % (ref 34.0–46.6)
Hemoglobin: 12.8 g/dL (ref 11.1–15.9)
Hepatitis B Surface Ag: NEGATIVE
Immature Grans (Abs): 0 10*3/uL (ref 0.0–0.1)
Immature Granulocytes: 0 %
Lymphocytes Absolute: 1.7 10*3/uL (ref 0.7–3.1)
Lymphs: 25 %
MCH: 29 pg (ref 26.6–33.0)
MCHC: 33.9 g/dL (ref 31.5–35.7)
MCV: 86 fL (ref 79–97)
Monocytes Absolute: 0.5 10*3/uL (ref 0.1–0.9)
Monocytes: 7 %
Neutrophils Absolute: 4.5 10*3/uL (ref 1.4–7.0)
Neutrophils: 67 %
Platelets: 180 10*3/uL (ref 150–450)
RBC: 4.41 x10E6/uL (ref 3.77–5.28)
RDW: 13.1 % (ref 11.7–15.4)
RPR Ser Ql: NONREACTIVE
Rh Factor: POSITIVE
Rubella Antibodies, IGG: 2.16 index (ref >0.99)
WBC: 6.7 10*3/uL (ref 3.4–10.8)

## 2019-07-30 LAB — CERVICOVAGINAL ANCILLARY ONLY
Bacterial Vaginitis (gardnerella): POSITIVE — AB
Candida Glabrata: NEGATIVE
Candida Vaginitis: NEGATIVE
Chlamydia: NEGATIVE
Comment: NEGATIVE
Comment: NEGATIVE
Comment: NEGATIVE
Comment: NEGATIVE
Comment: NEGATIVE
Comment: NORMAL
Neisseria Gonorrhea: NEGATIVE
Trichomonas: NEGATIVE

## 2019-07-30 LAB — HCV INTERPRETATION

## 2019-07-31 LAB — URINE CULTURE, OB REFLEX

## 2019-07-31 LAB — CULTURE, OB URINE

## 2019-08-02 ENCOUNTER — Other Ambulatory Visit: Payer: Self-pay | Admitting: Obstetrics

## 2019-08-02 DIAGNOSIS — N760 Acute vaginitis: Secondary | ICD-10-CM

## 2019-08-02 LAB — CYTOLOGY - PAP

## 2019-08-02 MED ORDER — METRONIDAZOLE 500 MG PO TABS: 500.0000 mg | ORAL_TABLET | Freq: Two times a day (BID) | ORAL | 2 refills | Status: DC

## 2019-08-04 ENCOUNTER — Encounter: Payer: Self-pay | Admitting: Obstetrics

## 2019-08-09 ENCOUNTER — Encounter: Payer: Self-pay | Admitting: Obstetrics

## 2019-08-24 ENCOUNTER — Telehealth: Payer: Self-pay

## 2019-08-24 NOTE — Telephone Encounter (Signed)
Returned call, pt stated that she ate a salad last night and her stomach has been hurting. Advised pt of OTC safe medications to take during pregnancy and advised to call if symptoms do not improve, pt agreed.

## 2019-08-26 ENCOUNTER — Telehealth (INDEPENDENT_AMBULATORY_CARE_PROVIDER_SITE_OTHER): Payer: Medicaid Other | Admitting: Obstetrics

## 2019-08-26 ENCOUNTER — Encounter: Payer: Self-pay | Admitting: Obstetrics

## 2019-08-26 DIAGNOSIS — Z3490 Encounter for supervision of normal pregnancy, unspecified, unspecified trimester: Secondary | ICD-10-CM

## 2019-08-26 DIAGNOSIS — Z98891 History of uterine scar from previous surgery: Secondary | ICD-10-CM

## 2019-08-26 DIAGNOSIS — Z3A17 17 weeks gestation of pregnancy: Secondary | ICD-10-CM

## 2019-08-26 DIAGNOSIS — O34219 Maternal care for unspecified type scar from previous cesarean delivery: Secondary | ICD-10-CM

## 2019-08-26 DIAGNOSIS — Z302 Encounter for sterilization: Secondary | ICD-10-CM

## 2019-08-26 NOTE — Progress Notes (Signed)
   OBSTETRICS PRENATAL VIRTUAL VISIT ENCOUNTER NOTE  Provider location: Center for [facility]   I connected with Cindy Pearson on 08/26/19 at 10:00 AM EDT by MyChart Video Encounter at home and verified that I am speaking with the correct person using two identifiers.   I discussed the limitations, risks, security and privacy concerns of performing an evaluation and management service virtually and the availability of in person appointments. I also discussed with the patient that there may be a patient responsible charge related to this service. The patient expressed understanding and agreed to proceed. Subjective:  Cindy Pearson is a 24 y.o. [phone] at [redacted]w[redacted]d being seen today for ongoing prenatal care.  She is currently monitored for the following issues for this low-risk pregnancy and has S/P cesarean section; Chlamydia; Supervision of other normal pregnancy, antepartum; Postpartum severe preeclampsia; and Supervision of normal pregnancy, antepartum on their problem list.  Patient reports no complaints.  Contractions: Not present. Vag. Bleeding: None.   . Denies any leaking of fluid.   The following portions of the patient's history were reviewed and updated as appropriate: allergies, current medications, past family history, past medical history, past social history, past surgical history and problem list.   Objective:  There were no vitals filed for this visit.  Fetal Status:           General:  Alert, oriented and cooperative. Patient is in no acute distress.  Respiratory: Normal respiratory effort, no problems with respiration noted  Mental Status: Normal mood and affect. Normal behavior. Normal judgment and thought content.  Rest of physical exam deferred due to type of encounter  Imaging: No results found.  Assessment and Plan:  Pregnancy: [id number] at [redacted]w[redacted]d 1. Encounter for supervision of normal pregnancy, antepartum, unspecified gravidity  1. Encounter for  supervision of normal pregnancy, antepartum, unspecified gravidity  2. History of cesarean section x 3 , low transverse  3. Declines VBAC (vaginal birth after cesarean) trial  4. Request for sterilization with repeat C/S   Preterm labor symptoms and general obstetric precautions including but not limited to vaginal bleeding, contractions, leaking of fluid and fetal movement were reviewed in detail with the patient. I discussed the assessment and treatment plan with the patient. The patient was provided an opportunity to ask questions and all were answered. The patient agreed with the plan and demonstrated an understanding of the instructions. The patient was advised to call back or seek an in-person office evaluation/go to MAU at [facility] for any urgent or concerning symptoms. Please refer to After Visit Summary for other counseling recommendations.   I provided 10 minutes of face-to-face time during this encounter.  Return in about 4 weeks (around 09/23/2019) for MyChart.  Future Appointments  Date Time Provider Department Center  09/09/2019  9:45 AM [facility] US4 [facility]    Coral Ceo, MD Center for [facility], [facility] 08/26/19

## 2019-08-26 NOTE — Progress Notes (Signed)
I connected with  Cindy Pearson on 08/26/19 by a video enabled telemedicine application and verified that I am speaking with the correct person using two identifiers.   I discussed the limitations of evaluation and management by telemedicine. The patient expressed understanding and agreed to proceed.  MyChart OB, reports no problems today, she is unable to check her BP because she did not have a ride to go pick up her BP Cuff.

## 2019-09-09 ENCOUNTER — Other Ambulatory Visit: Payer: Self-pay

## 2019-09-09 ENCOUNTER — Ambulatory Visit: Payer: Medicaid Other | Attending: Obstetrics

## 2019-09-09 ENCOUNTER — Ambulatory Visit: Payer: Medicaid Other

## 2019-09-09 DIAGNOSIS — Z3490 Encounter for supervision of normal pregnancy, unspecified, unspecified trimester: Secondary | ICD-10-CM | POA: Insufficient documentation

## 2019-09-10 ENCOUNTER — Other Ambulatory Visit: Payer: Self-pay | Admitting: *Deleted

## 2019-09-10 DIAGNOSIS — Z362 Encounter for other antenatal screening follow-up: Secondary | ICD-10-CM

## 2019-09-25 ENCOUNTER — Inpatient Hospital Stay (HOSPITAL_COMMUNITY)
Admission: AD | Admit: 2019-09-25 | Discharge: 2019-09-25 | Disposition: A | Discharge status: Home / Self Care | Payer: Medicaid Other | Attending: Obstetrics and Gynecology | Admitting: Obstetrics and Gynecology

## 2019-09-25 ENCOUNTER — Other Ambulatory Visit: Payer: Self-pay

## 2019-09-25 ENCOUNTER — Encounter (HOSPITAL_COMMUNITY): Payer: Self-pay | Admitting: Obstetrics and Gynecology

## 2019-09-25 DIAGNOSIS — O99891 Other specified diseases and conditions complicating pregnancy: Secondary | ICD-10-CM

## 2019-09-25 DIAGNOSIS — J45909 Unspecified asthma, uncomplicated: Secondary | ICD-10-CM | POA: Insufficient documentation

## 2019-09-25 DIAGNOSIS — O26892 Other specified pregnancy related conditions, second trimester: Secondary | ICD-10-CM | POA: Insufficient documentation

## 2019-09-25 DIAGNOSIS — O99012 Anemia complicating pregnancy, second trimester: Secondary | ICD-10-CM | POA: Diagnosis present

## 2019-09-25 DIAGNOSIS — R42 Dizziness and giddiness: Secondary | ICD-10-CM

## 2019-09-25 DIAGNOSIS — Z87891 Personal history of nicotine dependence: Secondary | ICD-10-CM | POA: Diagnosis not present

## 2019-09-25 DIAGNOSIS — B9689 Other specified bacterial agents as the cause of diseases classified elsewhere: Secondary | ICD-10-CM

## 2019-09-25 DIAGNOSIS — D649 Anemia, unspecified: Secondary | ICD-10-CM

## 2019-09-25 DIAGNOSIS — N760 Acute vaginitis: Secondary | ICD-10-CM | POA: Diagnosis not present

## 2019-09-25 DIAGNOSIS — Z3A21 21 weeks gestation of pregnancy: Secondary | ICD-10-CM | POA: Diagnosis not present

## 2019-09-25 DIAGNOSIS — Z79899 Other long term (current) drug therapy: Secondary | ICD-10-CM | POA: Insufficient documentation

## 2019-09-25 DIAGNOSIS — O23592 Infection of other part of genital tract in pregnancy, second trimester: Secondary | ICD-10-CM | POA: Diagnosis not present

## 2019-09-25 DIAGNOSIS — O99512 Diseases of the respiratory system complicating pregnancy, second trimester: Secondary | ICD-10-CM | POA: Insufficient documentation

## 2019-09-25 DIAGNOSIS — O98512 Other viral diseases complicating pregnancy, second trimester: Secondary | ICD-10-CM | POA: Insufficient documentation

## 2019-09-25 LAB — URINALYSIS, ROUTINE W REFLEX MICROSCOPIC
Bilirubin Urine: NEGATIVE
Glucose, UA: NEGATIVE mg/dL
Hgb urine dipstick: NEGATIVE
Ketones, ur: NEGATIVE mg/dL
Nitrite: NEGATIVE
Protein, ur: NEGATIVE mg/dL
Specific Gravity, Urine: 1.014 (ref 1.005–1.030)
pH: 7 (ref 5.0–8.0)

## 2019-09-25 LAB — CBC
HCT: 34.3 % — ABNORMAL LOW (ref 36.0–46.0)
Hemoglobin: 11.2 g/dL — ABNORMAL LOW (ref 12.0–15.0)
MCH: 28.5 pg (ref 26.0–34.0)
MCHC: 32.7 g/dL (ref 30.0–36.0)
MCV: 87.3 fL (ref 80.0–100.0)
Platelets: 155 10*3/uL (ref 150–400)
RBC: 3.93 MIL/uL (ref 3.87–5.11)
RDW: 13.2 % (ref 11.5–15.5)
WBC: 6.4 10*3/uL (ref 4.0–10.5)
nRBC: 0 % (ref 0.0–0.2)

## 2019-09-25 LAB — GLUCOSE, CAPILLARY
Glucose-Capillary: 108 mg/dL — ABNORMAL HIGH (ref 70–99)
Glucose-Capillary: 97 mg/dL (ref 70–99)

## 2019-09-25 MED ORDER — FERROUS SULFATE 325 (65 FE) MG PO TABS
325.0000 mg | ORAL_TABLET | Freq: Every day | ORAL | 0 refills | Status: AC
Start: 2019-09-25 — End: ?

## 2019-09-25 NOTE — MAU Note (Signed)
Patient c/o dizziness for past month.  Nothing new today, "just thought it was about time to get checked out."  Denies pain, LOF, or vaginal bleeding.  Reports feeling some fetal movement.  States that she starts feeling dizzy and sees spots after being up and moving around for 15-84min at a time.

## 2019-09-25 NOTE — MAU Provider Note (Signed)
History     CSN: [id number]  Arrival date and time: 09/25/19 1925   First Provider Initiated Contact with Patient 09/25/19 1957      Chief Complaint  Patient presents with  . Dizziness   HPI Cindy Pearson is a 24 y.o. [id number] at 56w5dwho presents with dizziness. She states she has been feeling this way for more than a month. She states nothing has changed tonight but feels like it was time to get checked out. She denies any syncope. She denies any bleeding or leaking of fluid. Reports feeling fetal movement. She states she started craving ice this week.   OB History    Gravida  6   Para  4   Term  4   Preterm  0   AB  1   Living  4     SAB  1   TAB  0   Ectopic  0   Multiple  0   Live Births  4           Past Medical History:  Diagnosis Date  . Asthma    Albuterol INH prn  . Blood transfusion without reported diagnosis   . Chlamydia 2012   hasn't picked up rx, not treated yet  . 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 Preterm labor 2011    Past Surgical History:  Procedure Laterality Date  . CESAREAN SECTION    . CESAREAN SECTION N/A 05/26/2016   Procedure: CESAREAN SECTION;  Surgeon: EFlorian Buff MD;  [place] [facility]  Service: Obstetrics;  Laterality: N/A;  . CESAREAN SECTION N/A 02/05/2018   Procedure: CESAREAN SECTION;  Surgeon: DSloan Leiter MD;  [place] [facility]  Service: Obstetrics;  Laterality: N/A;  . TONSILLECTOMY AND ADENOIDECTOMY      Family History  Problem Relation Age of Onset  . Diabetes Maternal Grandmother   . Heart disease Maternal Grandfather   . Cancer Maternal Grandfather   . Heart disease Paternal Grandfather   . Cancer Father     Social History   Tobacco Use  . Smoking status: Former Smoker    Types: Cigars    Quit date: 05/19/2019    Years since quitting: 0.3  . Smokeless tobacco: Never Used  . Tobacco comment: started again 02/11/2018  Vaping Use  . Vaping Use: Former   Substance Use Topics  . Alcohol use: Not Currently    Comment: occasional  . Drug use: Not Currently    Types: Marijuana    Comment: last smoked in May 2021    Allergies:  Allergies  Allergen Reactions  . Penicillins Hives    Patient states that she is allergic to all penicillins. Has patient had a PCN reaction causing immediate rash, facial/tongue/throat swelling, SOB or lightheadedness with hypotension: YES Has patient had a PCN reaction causing severe rash involving mucus membranes or skin necrosis: NO Has patient had a PCN reaction that required hospitalization NO Has patient had a PCN reaction occurring within the last 10 years:NO If all of the above answers are "NO", then may proceed with Cephalosporin use.    Medications Prior to Admission  Medication Sig Dispense Refill Last Dose  . Doxylamine-Pyridoxine (DICLEGIS) 10-10 MG TBEC 1 tab in AM, 1 tab mid afternoon 2 tabs at bedtime. Max dose 4 tabs daily. 100 tablet 5 09/25/2019 at Unknown time  . Blood Pressure Monitor KIT 1 Device by Does not apply route once a week. To  be monitored Regularly at home. 1 kit 0   . docusate sodium (COLACE) 100 MG capsule Take 1 capsule (100 mg total) by mouth 2 (two) times daily. 60 capsule 5   . famotidine (PEPCID) 20 MG tablet Take 1 tablet (20 mg total) by mouth once for 1 dose. 30 tablet 0   . medroxyPROGESTERone (DEPO-PROVERA) 150 MG/ML injection Inject 1 mL (150 mg total) into the muscle every 3 (three) months. 1 mL 3   . metroNIDAZOLE (FLAGYL) 500 MG tablet Take 1 tablet (500 mg total) by mouth 2 (two) times daily. 14 tablet 2   . polyethylene glycol (MIRALAX) packet Take 17 g by mouth at bedtime. 30 each 5   . Prenatal Vit-Fe Fumarate-FA (PRENATAL COMPLETE) 14-0.4 MG TABS Take 1 tablet by mouth daily. 60 tablet 1   . Prenatal Vit-Fe Phos-FA-Omega (VITAFOL GUMMIES) 3.33-0.333-34.8 MG CHEW Chew 3 tablets by mouth daily before breakfast. 90 tablet 11   . promethazine (PHENERGAN) 25 MG tablet  Take 1 tablet (25 mg total) by mouth once for 1 dose. 30 tablet 0     Review of Systems  Constitutional: Negative.  Negative for fatigue and fever.  HENT: Negative.   Respiratory: Negative.  Negative for shortness of breath.   Cardiovascular: Negative.  Negative for chest pain.  Gastrointestinal: Negative.  Negative for abdominal pain, constipation, diarrhea, nausea and vomiting.  Genitourinary: Negative.  Negative for dysuria, vaginal bleeding and vaginal discharge.  Neurological: Positive for dizziness. Negative for headaches.   Physical Exam   Blood pressure (!) 102/54, pulse 87, temperature 98.3 F (36.8 C), temperature source Oral, resp. rate 16, height 5' (1.524 m), weight 70.3 kg, last menstrual period 03/29/2019, SpO2 100 %, not currently breastfeeding.  Physical Exam Vitals and nursing note reviewed.  Constitutional:      General: She is not in acute distress.    Appearance: She is well-developed.  HENT:     Head: Normocephalic.  Eyes:     Pupils: Pupils are equal, round, and reactive to light.  Cardiovascular:     Rate and Rhythm: Normal rate and regular rhythm.     Heart sounds: Normal heart sounds.  Pulmonary:     Effort: Pulmonary effort is normal. No respiratory distress.     Breath sounds: Normal breath sounds.  Abdominal:     General: Bowel sounds are normal. There is no distension.     Palpations: Abdomen is soft.     Tenderness: There is no abdominal tenderness.  Skin:    General: Skin is warm and dry.  Neurological:     Mental Status: She is alert and oriented to person, place, and time.  Psychiatric:        Behavior: Behavior normal.        Thought Content: Thought content normal.        Judgment: Judgment normal.     FHT: 140 bpm  MAU Course  Procedures Results for orders placed or performed during the hospital encounter of 09/25/19 (from the past 24 hour(s))  Glucose, capillary     Status: Abnormal   Collection Time: 09/25/19  8:09 PM   Result Value Ref Range   Glucose-Capillary 108 (H) 70 - 99 mg/dL  CBC     Status: Abnormal   Collection Time: 09/25/19  8:12 PM  Result Value Ref Range   WBC 6.4 4.0 - 10.5 K/uL   RBC 3.93 3.87 - 5.11 MIL/uL   Hemoglobin 11.2 (L) 12.0 - 15.0 g/dL   HCT  34.3 (L) 36 - 46 %   MCV 87.3 80.0 - 100.0 fL   MCH 28.5 26.0 - 34.0 pg   MCHC 32.7 30.0 - 36.0 g/dL   RDW 13.2 11.5 - 15.5 %   Platelets 155 150 - 400 K/uL   nRBC 0.0 0.0 - 0.2 %  Urinalysis, Routine w reflex microscopic     Status: Abnormal   Collection Time: 09/25/19  8:29 PM  Result Value Ref Range   Color, Urine YELLOW YELLOW   APPearance HAZY (A) CLEAR   Specific Gravity, Urine 1.014 1.005 - 1.030   pH 7.0 5.0 - 8.0   Glucose, UA NEGATIVE NEGATIVE mg/dL   Hgb urine dipstick NEGATIVE NEGATIVE   Bilirubin Urine NEGATIVE NEGATIVE   Ketones, ur NEGATIVE NEGATIVE mg/dL   Protein, ur NEGATIVE NEGATIVE mg/dL   Nitrite NEGATIVE NEGATIVE   Leukocytes,Ua SMALL (A) NEGATIVE   RBC / HPF 0-5 0 - 5 RBC/hpf   WBC, UA 0-5 0 - 5 WBC/hpf   Bacteria, UA RARE (A) NONE SEEN   Squamous Epithelial / LPF 6-10 0 - 5   Mucus PRESENT   Glucose, capillary     Status: None   Collection Time: 09/25/19  8:29 PM  Result Value Ref Range   Glucose-Capillary 97 70 - 99 mg/dL   MDM UA CBC, CBG  Assessment and Plan   1. Anemia during pregnancy in second trimester   2. Dizziness   3. [redacted] weeks gestation of pregnancy   4. BV (bacterial vaginosis)    -Discharge home in stable condition -Rx for ferrous sulfate sent to patient's pharmacy -Second trimester precautions discussed -Patient advised to follow-up with OB as scheduled on Tuesday for prenatal care -Patient may return to MAU as needed or if her condition were to change or worsen   Wende Mott CNM 09/25/2019, 7:57 PM

## 2019-09-25 NOTE — Discharge Instructions (Signed)
Safe Medications in Pregnancy   Acne: Benzoyl Peroxide Salicylic Acid  Backache/Headache: Tylenol: 2 regular strength every 4 hours OR              2 Extra strength every 6 hours  Colds/Coughs/Allergies: Benadryl (alcohol free) 25 mg every 6 hours as needed Breath right strips Claritin Cepacol throat lozenges Chloraseptic throat spray Cold-Eeze- up to three times per day Cough drops, alcohol free Flonase (by prescription only) Guaifenesin Mucinex Robitussin DM (plain only, alcohol free) Saline nasal spray/drops Sudafed (pseudoephedrine) & Actifed ** use only after [redacted] weeks gestation and if you do not have high blood pressure Tylenol Vicks Vaporub Zinc lozenges Zyrtec   Constipation: Colace Ducolax suppositories Fleet enema Glycerin suppositories Metamucil Milk of magnesia Miralax Senokot Smooth move tea  Diarrhea: Kaopectate Imodium A-D  *NO pepto Bismol  Hemorrhoids: Anusol Anusol HC Preparation H Tucks  Indigestion: Tums Maalox Mylanta Zantac  Pepcid  Insomnia: Benadryl (alcohol free) 61m every 6 hours as needed Tylenol PM Unisom, no Gelcaps  Leg Cramps: Tums MagGel  Nausea/Vomiting:  Bonine Dramamine Emetrol Ginger extract Sea bands Meclizine  Nausea medication to take during pregnancy:  Unisom (doxylamine succinate 25 mg tablets) Take one tablet daily at bedtime. If symptoms are not adequately controlled, the dose can be increased to a maximum recommended dose of two tablets daily (1/2 tablet in the morning, 1/2 tablet mid-afternoon and one at bedtime). Vitamin B6 1043mtablets. Take one tablet twice a day (up to 200 mg per day).  Skin Rashes: Aveeno products Benadryl cream or 2533mvery 6 hours as needed Calamine Lotion 1% cortisone cream  Yeast infection: Gyne-lotrimin 7 Monistat 7   **If taking multiple medications, please check labels to avoid duplicating the same active ingredients **take medication as directed on  the label ** Do not exceed 4000 mg of tylenol in 24 hours **Do not take medications that contain aspirin or ibuprofen     Anemia  Anemia is a condition in which you do not have enough red blood cells or hemoglobin. Hemoglobin is a substance in red blood cells that carries oxygen. When you do not have enough red blood cells or hemoglobin (are anemic), your body cannot get enough oxygen and your organs may not work properly. As a result, you may feel very tired or have other problems. What are the causes? Common causes of anemia include:  Excessive bleeding. Anemia can be caused by excessive bleeding inside or outside the body, including bleeding from the intestine or from periods in women.  Poor nutrition.  Long-lasting (chronic) kidney, thyroid, and liver disease.  Bone marrow disorders.  Cancer and treatments for cancer.  HIV (human immunodeficiency virus) and AIDS (acquired immunodeficiency syndrome).  Treatments for HIV and AIDS.  Spleen problems.  Blood disorders.  Infections, medicines, and autoimmune disorders that destroy red blood cells. What are the signs or symptoms? Symptoms of this condition include:  Minor weakness.  Dizziness.  Headache.  Feeling heartbeats that are irregular or faster than normal (palpitations).  Shortness of breath, especially with exercise.  Paleness.  Cold sensitivity.  Indigestion.  Nausea.  Difficulty sleeping.  Difficulty concentrating. Symptoms may occur suddenly or develop slowly. If your anemia is mild, you may not have symptoms. How is this diagnosed? This condition is diagnosed based on:  Blood tests.  Your medical history.  A physical exam.  Bone marrow biopsy. Your health care provider may also check your stool (feces) for blood and may do additional testing to look for  the cause of your bleeding. You may also have other tests, including:  Imaging tests, such as a CT scan or  MRI.  Endoscopy.  Colonoscopy. How is this treated? Treatment for this condition depends on the cause. If you continue to lose a lot of blood, you may need to be treated at a hospital. Treatment may include:  Taking supplements of iron, vitamin T02, or folic acid.  Taking a hormone medicine (erythropoietin) that can help to stimulate red blood cell growth.  Having a blood transfusion. This may be needed if you lose a lot of blood.  Making changes to your diet.  Having surgery to remove your spleen. Follow these instructions at home:  Take over-the-counter and prescription medicines only as told by your health care provider.  Take supplements only as told by your health care provider.  Follow any diet instructions that you were given.  Keep all follow-up visits as told by your health care provider. This is important. Contact a health care provider if:  You develop new bleeding anywhere in the body. Get help right away if:  You are very weak.  You are short of breath.  You have pain in your abdomen or chest.  You are dizzy or feel faint.  You have trouble concentrating.  You have bloody or black, tarry stools.  You vomit repeatedly or you vomit up blood. Summary  Anemia is a condition in which you do not have enough red blood cells or enough of a substance in your red blood cells that carries oxygen (hemoglobin).  Symptoms may occur suddenly or develop slowly.  If your anemia is mild, you may not have symptoms.  This condition is diagnosed with blood tests as well as a medical history and physical exam. Other tests may be needed.  Treatment for this condition depends on the cause of the anemia. This information is not intended to replace advice given to you by your health care provider. Make sure you discuss any questions you have with your health care provider. Document Revised: 12/06/2016 Document Reviewed: 01/26/2016 Elsevier Patient Education  [employer].

## 2019-09-27 LAB — CULTURE, OB URINE

## 2019-09-28 ENCOUNTER — Encounter: Payer: Medicaid Other | Admitting: Obstetrics and Gynecology

## 2019-10-07 ENCOUNTER — Ambulatory Visit: Payer: Medicaid Other | Attending: Obstetrics

## 2019-10-07 ENCOUNTER — Ambulatory Visit: Payer: Medicaid Other

## 2019-10-11 ENCOUNTER — Encounter: Payer: Self-pay | Admitting: Obstetrics and Gynecology

## 2019-10-11 ENCOUNTER — Other Ambulatory Visit: Payer: Self-pay

## 2019-10-11 ENCOUNTER — Ambulatory Visit (INDEPENDENT_AMBULATORY_CARE_PROVIDER_SITE_OTHER): Payer: Medicaid Other | Admitting: Obstetrics and Gynecology

## 2019-10-11 VITALS — BP 110/71 | HR 83 | Wt 164.0 lb

## 2019-10-11 DIAGNOSIS — Z98891 History of uterine scar from previous surgery: Secondary | ICD-10-CM

## 2019-10-11 DIAGNOSIS — Z8759 Personal history of other complications of pregnancy, childbirth and the puerperium: Secondary | ICD-10-CM | POA: Insufficient documentation

## 2019-10-11 DIAGNOSIS — R87612 Low grade squamous intraepithelial lesion on cytologic smear of cervix (LGSIL): Secondary | ICD-10-CM | POA: Insufficient documentation

## 2019-10-11 DIAGNOSIS — Z3490 Encounter for supervision of normal pregnancy, unspecified, unspecified trimester: Secondary | ICD-10-CM

## 2019-10-11 NOTE — Progress Notes (Signed)
ROB, want discuss working up to delivery.

## 2019-10-11 NOTE — Progress Notes (Signed)
   PRENATAL VISIT NOTE  Subjective:  Cindy Pearson is a 24 y.o. [phone] at [redacted]w[redacted]d being seen today for ongoing prenatal care.  She is currently monitored for the following issues for this low-risk pregnancy and has S/P cesarean section; Chlamydia; Supervision of other normal pregnancy, antepartum; Postpartum severe preeclampsia; Supervision of normal pregnancy, antepartum; History of pre-eclampsia; and LGSIL on Pap smear of cervix on their problem list.  Patient reports no complaints.  Contractions: Not present. Vag. Bleeding: None.  Movement: Present. Denies leaking of fluid.   The following portions of the patient's history were reviewed and updated as appropriate: allergies, current medications, past family history, past medical history, past social history, past surgical history and problem list.   Objective:   Vitals:   10/11/19 1529  BP: 110/71  Pulse: 83  Weight: 164 lb (74.4 kg)    Fetal Status: Fetal Heart Rate (bpm): 142 Fundal Height: 24 cm Movement: Present     General:  Alert, oriented and cooperative. Patient is in no acute distress.  Skin: Skin is warm and dry. No rash noted.   Cardiovascular: Normal heart rate noted  Respiratory: Normal respiratory effort, no problems with respiration noted  Abdomen: Soft, gravid, appropriate for gestational age.  Pain/Pressure: Absent     Pelvic: Cervical exam deferred        Extremities: Normal range of motion.  Edema: None  Mental Status: Normal mood and affect. Normal behavior. Normal judgment and thought content.   Assessment and Plan:  Pregnancy: [id number] at [redacted]w[redacted]d 1. Encounter for supervision of normal pregnancy, antepartum, unspecified gravidity Patient is doing well without complaints Follow up growth ultrasound Third trimester labs next visit  2. S/P cesarean section Will be scheduled for repeat at next visit  Patient initially contemplating BTL but now plans condoms  3. History of pre-eclampsia   4. LGSIL on Pap  smear of cervix Colposcopy postpartum per patient request  Preterm labor symptoms and general obstetric precautions including but not limited to vaginal bleeding, contractions, leaking of fluid and fetal movement were reviewed in detail with the patient. Please refer to After Visit Summary for other counseling recommendations.   Return in about 4 weeks (around 11/08/2019) for in person, ROB, Low risk, 2 hr glucola next visit.  No future appointments.  Catalina Antigua, MD

## 2019-11-08 ENCOUNTER — Encounter: Payer: Medicaid Other | Admitting: Advanced Practice Midwife

## 2019-11-08 ENCOUNTER — Other Ambulatory Visit: Payer: Medicaid Other

## 2019-11-17 ENCOUNTER — Ambulatory Visit (HOSPITAL_BASED_OUTPATIENT_CLINIC_OR_DEPARTMENT_OTHER): Payer: Medicaid Other

## 2019-11-17 ENCOUNTER — Other Ambulatory Visit: Payer: Medicaid Other

## 2019-11-17 ENCOUNTER — Ambulatory Visit: Payer: Medicaid Other | Attending: Obstetrics | Admitting: *Deleted

## 2019-11-17 ENCOUNTER — Ambulatory Visit (INDEPENDENT_AMBULATORY_CARE_PROVIDER_SITE_OTHER): Payer: Medicaid Other | Admitting: Nurse Practitioner

## 2019-11-17 ENCOUNTER — Other Ambulatory Visit: Payer: Self-pay

## 2019-11-17 ENCOUNTER — Encounter: Payer: Self-pay | Admitting: Nurse Practitioner

## 2019-11-17 VITALS — BP 111/67 | HR 72 | Wt 167.6 lb

## 2019-11-17 DIAGNOSIS — O09293 Supervision of pregnancy with other poor reproductive or obstetric history, third trimester: Secondary | ICD-10-CM | POA: Insufficient documentation

## 2019-11-17 DIAGNOSIS — Z3480 Encounter for supervision of other normal pregnancy, unspecified trimester: Secondary | ICD-10-CM

## 2019-11-17 DIAGNOSIS — O34219 Maternal care for unspecified type scar from previous cesarean delivery: Secondary | ICD-10-CM

## 2019-11-17 DIAGNOSIS — Z3A29 29 weeks gestation of pregnancy: Secondary | ICD-10-CM

## 2019-11-17 DIAGNOSIS — O365930 Maternal care for other known or suspected poor fetal growth, third trimester, not applicable or unspecified: Secondary | ICD-10-CM | POA: Insufficient documentation

## 2019-11-17 DIAGNOSIS — Z362 Encounter for other antenatal screening follow-up: Secondary | ICD-10-CM

## 2019-11-17 DIAGNOSIS — Z8759 Personal history of other complications of pregnancy, childbirth and the puerperium: Secondary | ICD-10-CM

## 2019-11-17 DIAGNOSIS — Z98891 History of uterine scar from previous surgery: Secondary | ICD-10-CM

## 2019-11-17 MED ORDER — COMFORT FIT MATERNITY SUPP MED MISC: 0 refills | Status: DC

## 2019-11-17 NOTE — Progress Notes (Signed)
    Subjective:  Cindy Pearson is a 24 y.o. [phone] at [redacted]w[redacted]d being seen today for ongoing prenatal care.  She is currently monitored for the following issues for this high-risk pregnancy and has S/P cesarean section; Chlamydia; Supervision of other normal pregnancy, antepartum; Postpartum severe preeclampsia; Supervision of normal pregnancy, antepartum; History of pre-eclampsia; and LGSIL on Pap smear of cervix on their problem list.  Patient reports some pelvic pressure but contractions are less than 4 in one hour.  Contractions: Not present. Vag. Bleeding: None.  Movement: Present. Denies leaking of fluid.   The following portions of the patient's history were reviewed and updated as appropriate: allergies, current medications, past family history, past medical history, past social history, past surgical history and problem list. Problem list updated.  Objective:   Vitals:   11/17/19 1004  BP: 111/67  Pulse: 72  Weight: 167 lb 9.6 oz (76 kg)    Fetal Status: Fetal Heart Rate (bpm): 154 Fundal Height: 30 cm Movement: Present     General:  Alert, oriented and cooperative. Patient is in no acute distress.  Skin: Skin is warm and dry. No rash noted.   Cardiovascular: Normal heart rate noted  Respiratory: Normal respiratory effort, no problems with respiration noted  Abdomen: Soft, gravid, appropriate for gestational age. Pain/Pressure: Absent     Pelvic:  Cervical exam deferred        Extremities: Normal range of motion.  Edema: None  Mental Status: Normal mood and affect. Normal behavior. Normal judgment and thought content.   Urinalysis:      Assessment and Plan:  Pregnancy: [id number] at [redacted]w[redacted]d  1. Supervision of other normal pregnancy, antepartum Requests pelvic support belt Decided she does not want Tubal ligation - wants to try for a boy in 5 years - likely will use Depo and also suggested Nexplanon as she does well with Depo. Advised if pressure increases, to call for an  additional appointment and be checked.  Patient did not think a pelvic was needed today.  2. History of cesarean section, low transverse Will need to see MD at next visit to be scheduled for repeat C/S    Preterm labor symptoms and general obstetric precautions including but not limited to vaginal bleeding, contractions, leaking of fluid and fetal movement were reviewed in detail with the patient. Please refer to After Visit Summary for other counseling recommendations.  Return in about 3 weeks (around 12/08/2019) for 3 weeks with MD for delivery plan and to schedule C/S.  Nolene Bernheim, RN, MSN, NP-BC Nurse Practitioner, [facility] for [facility], [facility] 11/17/2019 10:48 AM

## 2019-11-17 NOTE — Progress Notes (Signed)
ROB  CC feet swelling and pressure when urinating

## 2019-11-18 ENCOUNTER — Other Ambulatory Visit: Payer: Self-pay | Admitting: *Deleted

## 2019-11-18 DIAGNOSIS — O365930 Maternal care for other known or suspected poor fetal growth, third trimester, not applicable or unspecified: Secondary | ICD-10-CM

## 2019-11-18 LAB — CBC
Hematocrit: 34.8 % (ref 34.0–46.6)
Hemoglobin: 11.6 g/dL (ref 11.1–15.9)
MCH: 28.4 pg (ref 26.6–33.0)
MCHC: 33.3 g/dL (ref 31.5–35.7)
MCV: 85 fL (ref 79–97)
Platelets: 144 10*3/uL — ABNORMAL LOW (ref 150–450)
RBC: 4.08 x10E6/uL (ref 3.77–5.28)
RDW: 13.6 % (ref 11.7–15.4)
WBC: 5.7 10*3/uL (ref 3.4–10.8)

## 2019-11-18 LAB — GLUCOSE TOLERANCE, 2 HOURS W/ 1HR
Glucose, 1 hour: 98 mg/dL (ref 65–179)
Glucose, 2 hour: 77 mg/dL (ref 65–152)
Glucose, Fasting: 71 mg/dL (ref 65–91)

## 2019-11-18 LAB — RPR: RPR Ser Ql: NONREACTIVE

## 2019-11-18 LAB — HIV ANTIBODY (ROUTINE TESTING W REFLEX): HIV Screen 4th Generation wRfx: NONREACTIVE

## 2019-11-25 ENCOUNTER — Ambulatory Visit: Payer: Medicaid Other | Admitting: *Deleted

## 2019-11-25 ENCOUNTER — Other Ambulatory Visit: Payer: Self-pay

## 2019-11-25 ENCOUNTER — Ambulatory Visit: Payer: Medicaid Other | Attending: Obstetrics and Gynecology

## 2019-11-25 DIAGNOSIS — Z8759 Personal history of other complications of pregnancy, childbirth and the puerperium: Secondary | ICD-10-CM | POA: Diagnosis present

## 2019-11-25 DIAGNOSIS — O365990 Maternal care for other known or suspected poor fetal growth, unspecified trimester, not applicable or unspecified: Secondary | ICD-10-CM | POA: Insufficient documentation

## 2019-11-25 DIAGNOSIS — Z362 Encounter for other antenatal screening follow-up: Secondary | ICD-10-CM

## 2019-11-25 DIAGNOSIS — O365930 Maternal care for other known or suspected poor fetal growth, third trimester, not applicable or unspecified: Secondary | ICD-10-CM | POA: Insufficient documentation

## 2019-11-25 DIAGNOSIS — Z3A30 30 weeks gestation of pregnancy: Secondary | ICD-10-CM

## 2019-11-25 DIAGNOSIS — O34219 Maternal care for unspecified type scar from previous cesarean delivery: Secondary | ICD-10-CM | POA: Diagnosis not present

## 2019-11-25 NOTE — Procedures (Signed)
Cindy Pearson [birth year] [redacted]w[redacted]d  Fetus A Non-Stress Test Interpretation for 11/25/19  Indication: IUGR  Fetal Heart Rate A Mode: External Baseline Rate (A): 140 bpm Variability: Moderate Accelerations: 10 x 10 Decelerations: None Multiple birth?: No  Uterine Activity Mode: Palpation, Toco Contraction Frequency (min): none noted Resting Tone Palpated: Relaxed Resting Time: Adequate  Interpretation (Fetal Testing) Nonstress Test Interpretation: Reactive Overall Impression: Reassuring for gestational age Comments: Reviewed tracing with Dr. Judeth Cornfield

## 2019-11-29 ENCOUNTER — Ambulatory Visit: Payer: Medicaid Other

## 2019-12-08 ENCOUNTER — Other Ambulatory Visit: Payer: Self-pay

## 2019-12-08 ENCOUNTER — Other Ambulatory Visit: Payer: Self-pay | Admitting: Obstetrics

## 2019-12-08 ENCOUNTER — Ambulatory Visit: Payer: Medicaid Other | Attending: Obstetrics and Gynecology

## 2019-12-08 ENCOUNTER — Encounter: Payer: Self-pay | Admitting: *Deleted

## 2019-12-08 ENCOUNTER — Encounter: Payer: Self-pay | Admitting: Obstetrics and Gynecology

## 2019-12-08 ENCOUNTER — Ambulatory Visit (INDEPENDENT_AMBULATORY_CARE_PROVIDER_SITE_OTHER): Payer: Medicaid Other | Admitting: Obstetrics and Gynecology

## 2019-12-08 ENCOUNTER — Ambulatory Visit: Payer: Medicaid Other

## 2019-12-08 ENCOUNTER — Ambulatory Visit: Payer: Medicaid Other | Admitting: *Deleted

## 2019-12-08 VITALS — BP 109/70 | HR 89 | Wt 172.0 lb

## 2019-12-08 DIAGNOSIS — Z362 Encounter for other antenatal screening follow-up: Secondary | ICD-10-CM

## 2019-12-08 DIAGNOSIS — Z8759 Personal history of other complications of pregnancy, childbirth and the puerperium: Secondary | ICD-10-CM | POA: Diagnosis present

## 2019-12-08 DIAGNOSIS — Z3480 Encounter for supervision of other normal pregnancy, unspecified trimester: Secondary | ICD-10-CM | POA: Diagnosis not present

## 2019-12-08 DIAGNOSIS — O0993 Supervision of high risk pregnancy, unspecified, third trimester: Secondary | ICD-10-CM

## 2019-12-08 DIAGNOSIS — O365930 Maternal care for other known or suspected poor fetal growth, third trimester, not applicable or unspecified: Secondary | ICD-10-CM

## 2019-12-08 DIAGNOSIS — O34219 Maternal care for unspecified type scar from previous cesarean delivery: Secondary | ICD-10-CM | POA: Diagnosis not present

## 2019-12-08 DIAGNOSIS — Z23 Encounter for immunization: Secondary | ICD-10-CM

## 2019-12-08 DIAGNOSIS — R87612 Low grade squamous intraepithelial lesion on cytologic smear of cervix (LGSIL): Secondary | ICD-10-CM

## 2019-12-08 DIAGNOSIS — Z3A32 32 weeks gestation of pregnancy: Secondary | ICD-10-CM

## 2019-12-08 DIAGNOSIS — Z98891 History of uterine scar from previous surgery: Secondary | ICD-10-CM

## 2019-12-08 NOTE — Progress Notes (Signed)
Pt presents for ROB has questions about IOL d/t IUGR TDap given today without difficulty  MFM visit scheduled today

## 2019-12-08 NOTE — Progress Notes (Signed)
   PRENATAL VISIT NOTE  Subjective:  Cindy Pearson is a 24 y.o. [phone] at [redacted]w[redacted]d being seen today for ongoing prenatal care.  She is currently monitored for the following issues for this high-risk pregnancy and has IUGR (intrauterine growth restriction) affecting care of mother; S/P cesarean section; Chlamydia; Supervision of other normal pregnancy, antepartum; Postpartum severe preeclampsia; Supervision of normal pregnancy, antepartum; History of pre-eclampsia; LGSIL on Pap smear of cervix; and [redacted] weeks gestation of pregnancy on their problem list.  Patient doing well with no acute concerns today. She reports no complaints.  Contractions: Irregular. Vag. Bleeding: None.  Movement: Present. Denies leaking of fluid.   Discussed concerns with 4th cesarean section.  Reviewed last op note which showed thin lower uterine segment and some omental adhesions.  Discussed increased complication rate with high order cesarean section as well as increased occurrence of placental abnormalities ie accreta.  Pt is considering BTL versus possible post delivery IUD  The following portions of the patient's history were reviewed and updated as appropriate: allergies, current medications, past family history, past medical history, past social history, past surgical history and problem list. Problem list updated.  Objective:   Vitals:   12/08/19 0929  BP: 109/70  Pulse: 89  Weight: 172 lb (78 kg)    Fetal Status: Fetal Heart Rate (bpm): 152   Movement: Present     General:  Alert, oriented and cooperative. Patient is in no acute distress.  Skin: Skin is warm and dry. No rash noted.   Cardiovascular: Normal heart rate noted  Respiratory: Normal respiratory effort, no problems with respiration noted  Abdomen: Soft, gravid, appropriate for gestational age.  Pain/Pressure: Absent     Pelvic: Cervical exam deferred        Extremities: Normal range of motion.  Edema: None  Mental Status:  Normal mood and affect.  Normal behavior. Normal judgment and thought content.   Assessment and Plan:  Pregnancy: [id number] at [redacted]w[redacted]d  1. Supervision of high risk pregnancy in third trimester   2. [redacted] weeks gestation of pregnancy   3. Poor fetal growth affecting management of mother in third trimester, single or unspecified fetus Pt has growth scan and dopplers today, per MFM possible delivery at 37 weeks  4. S/P cesarean section C/s x 4 and will need repeat, may need to schedule at 37 weeks unless change in MFM plan  5. History of pre-eclampsia Normal BP today  6. LGSIL on Pap smear of cervix repap after delivery  Preterm labor symptoms and general obstetric precautions including but not limited to vaginal bleeding, contractions, leaking of fluid and fetal movement were reviewed in detail with the patient.  Please refer to After Visit Summary for other counseling recommendations.   Return in about 2 weeks (around 12/22/2019) for [facility], in person.   Mariel Aloe, MD

## 2019-12-08 NOTE — Patient Instructions (Signed)
Third Trimester of Pregnancy  The third trimester is from week 28 through week 40 (months 7 through 9). This trimester is when your unborn baby (fetus) is growing very fast. At the end of the ninth month, the unborn baby is about 20 inches in length. It weighs about 6-10 pounds. Follow these instructions at home: Medicines  Take over-the-counter and prescription medicines only as told by your doctor. Some medicines are safe and some medicines are not safe during pregnancy.  Take a prenatal vitamin that contains at least 600 micrograms (mcg) of folic acid.  If you have trouble pooping (constipation), take medicine that will make your stool soft (stool softener) if your doctor approves. Eating and drinking   Eat regular, healthy meals.  Avoid raw meat and uncooked cheese.  If you get low calcium from the food you eat, talk to your doctor about taking a daily calcium supplement.  Eat four or five small meals rather than three large meals a day.  Avoid foods that are high in fat and sugars, such as fried and sweet foods.  To prevent constipation: ? Eat foods that are high in fiber, like fresh fruits and vegetables, whole grains, and beans. ? Drink enough fluids to keep your pee (urine) clear or pale yellow. Activity  Exercise only as told by your doctor. Stop exercising if you start to have cramps.  Avoid heavy lifting, wear low heels, and sit up straight.  Do not exercise if it is too hot, too humid, or if you are in a place of great height (high altitude).  You may continue to have sex unless your doctor tells you not to. Relieving pain and discomfort  Wear a good support bra if your breasts are tender.  Take frequent breaks and rest with your legs raised if you have leg cramps or low back pain.  Take warm water baths (sitz baths) to soothe pain or discomfort caused by hemorrhoids. Use hemorrhoid cream if your doctor approves.  If you develop puffy, bulging veins (varicose  veins) in your legs: ? Wear support hose or compression stockings as told by your doctor. ? Raise (elevate) your feet for 15 minutes, 3-4 times a day. ? Limit salt in your food. Safety  Wear your seat belt when driving.  Make a list of emergency phone numbers, including numbers for family, friends, the hospital, and police and fire departments. Preparing for your baby's arrival To prepare for the arrival of your baby:  Take prenatal classes.  Practice driving to the hospital.  Visit the hospital and tour the maternity area.  Talk to your work about taking leave once the baby comes.  Pack your hospital bag.  Prepare the baby's room.  Go to your doctor visits.  Buy a rear-facing car seat. Learn how to install it in your car. General instructions  Do not use hot tubs, steam rooms, or saunas.  Do not use any products that contain nicotine or tobacco, such as cigarettes and e-cigarettes. If you need help quitting, ask your doctor.  Do not drink alcohol.  Do not douche or use tampons or scented sanitary pads.  Do not cross your legs for long periods of time.  Do not travel for long distances unless you must. Only do so if your doctor says it is okay.  Visit your dentist if you have not gone during your pregnancy. Use a soft toothbrush to brush your teeth. Be gentle when you floss.  Avoid cat litter boxes and soil   used by cats. These carry germs that can cause birth defects in the baby and can cause a loss of your baby (miscarriage) or stillbirth.  Keep all your prenatal visits as told by your doctor. This is important. Contact a doctor if:  You are not sure if you are in labor or if your water has broken.  You are dizzy.  You have mild cramps or pressure in your lower belly.  You have a nagging pain in your belly area.  You continue to feel sick to your stomach, you throw up, or you have watery poop.  You have bad smelling fluid coming from your vagina.  You have  pain when you pee. Get help right away if:  You have a fever.  You are leaking fluid from your vagina.  You are spotting or bleeding from your vagina.  You have severe belly cramps or pain.  You lose or gain weight quickly.  You have trouble catching your breath and have chest pain.  You notice sudden or extreme puffiness (swelling) of your face, hands, ankles, feet, or legs.  You have not felt the baby move in over an hour.  You have severe headaches that do not go away with medicine.  You have trouble seeing.  You are leaking, or you are having a gush of fluid, from your vagina before you are 37 weeks.  You have regular belly spasms (contractions) before you are 37 weeks. Summary  The third trimester is from week 28 through week 40 (months 7 through 9). This time is when your unborn baby is growing very fast.  Follow your doctor's advice about medicine, food, and activity.  Get ready for the arrival of your baby by taking prenatal classes, getting all the baby items ready, preparing the baby's room, and visiting your doctor to be checked.  Get help right away if you are bleeding from your vagina, or you have chest pain and trouble catching your breath, or if you have not felt your baby move in over an hour. This information is not intended to replace advice given to you by your health care provider. Make sure you discuss any questions you have with your health care provider. Document Revised: 04/16/2018 Document Reviewed: 01/30/2016 Elsevier Patient Education  2020 [employer] Inc[employer] Intrauterine Growth Restriction  Intrauterine growth restriction (IUGR) is when a baby is not growing normally during pregnancy. A baby with IUGR is smaller than it should be and may weigh less than normal at birth. IUGR can result from a problem with the organ that supplies the unborn baby (fetus) with oxygen and nutrition (placenta). Usually, there is no way to prevent this type of problem.  Babies with IUGR are at higher risk for early delivery and needing special (intensive) care after birth. What are the causes? The most common cause of IUGR is a problem with the placenta or umbilical cord that causes the fetus to get less oxygen or nutrition than needed. Other causes include:  The mother eating a very unhealthy diet (poor maternal nutrition).  Exposure to chemicals found in substances such as cigarettes, alcohol, and some drugs.  Some prescription medicines.  Other problems that develop in the womb (congenital birth defects).  Genetic disorders.  Infection.  Carrying more than one baby. What increases the risk? This condition is more likely to affect babies of mothers who:  Are over the age of 59 at the time of delivery.  Are younger than age 1 at the time of  delivery.  Have medical conditions such as high blood pressure, preeclampsia, diabetes, heart or kidney disease, systemic lupus erythematosus, or anemia.  Live at a very high altitude during pregnancy.  Have a personal history or family history of: ? IUGR. ? A genetic disorder.  Take medicines during pregnancy that are related to congenital disabilities.  Come into contact with infected cat feces (toxoplasmosis).  Come into contact with chickenpox (varicella) or [place] measles (rubella).  Have or are at risk of getting an infectious disease such as syphilis, HIV, or herpes.  Eat an unhealthy diet during pregnancy.  Weigh less than 100 pounds.  Have had treatments to help her have children (infertility treatments).  Use tobacco, drugs, or alcohol during pregnancy. What are the signs or symptoms? IUGR does not cause many symptoms. You might notice that your baby does not move or kick very often. Also, your belly may not be as big as expected for the stage of your pregnancy. How is this diagnosed? This condition is diagnosed with physical exams and prenatal exams. You may also have:  Fundal  measurements to check the size of your uterus.  An ultrasound done to measure your baby's size compared to the size of other babies at the same stage of development (gestational age). Your health care provider will monitor your baby's growth with ultrasounds throughout pregnancy. You may also have tests to find the cause of IUGR. These may include:  Amniocentesis. This is a procedure that involves passing a needle into the uterus to collect a sample of fluid that surrounds the fetus (amniotic fluid). This may be done to check for signs of infection or congenital defects.  Tests to evaluate blood flow to your baby and placenta. How is this treated? In most cases, the goal of treatment is to treat the cause of IUGR. Your health care providers will monitor your pregnancy closely and help you manage your pregnancy. If your condition is caused by a placenta problem and your baby is not getting enough blood, you may need:  Medicine to start labor and deliver your baby early (induction).  Cesarean delivery, also called a C-section. In this procedure, your baby is delivered through an incision in your abdomen and uterus. Follow these instructions at home: Medicines  Take over-the-counter and prescription medicines only as told by your health care provider. This includes vitamins and supplements.  Make sure that your health care provider knows about and approves of all the medicines, supplements, vitamins, eye drops, and creams that you use. General instructions  Eat a healthy diet that includes fresh fruits and vegetables, lean proteins, whole grains, and calcium-rich foods such as milk, yogurt, and dark, leafy greens. Work with your health care provider or a dietitian to make sure that: ? You are getting enough nutrients. ? You are gaining enough weight.  Rest as needed. Try to get at least 8 hours of sleep every night.  Do not drink alcohol or use drugs.  Do not use any products that contain  nicotine or tobacco, such as cigarettes and e-cigarettes. If you need help quitting, ask your health care provider.  Keep all follow-up visits as told by your health care provider. This is important. Get help right away if you:  Notice that your baby is moving less than usual or is not moving.  Have contractions that are 5 minutes or less apart, or that increase in frequency, intensity, or length.  Have signs and symptoms of infection, including a fever.  Have  vaginal bleeding.  Have increased swelling in your legs, hands, or face.  Have vision changes, including seeing spots or having blurry or double vision.  Have a severe headache that does not go away.  Have sudden, sharp abdominal pain or low back pain.  Have an uncontrolled gush or trickle of fluid from your vagina. Summary  Intrauterine growth restriction (IUGR) is when a baby is not growing normally during pregnancy.  The most common cause of IUGR is a problem with the placenta or umbilical cord that causes the fetus to get less oxygen or nutrition than needed.  This condition is diagnosed with physical and prenatal exams. Your health care provider will monitor your baby's growth with ultrasounds throughout pregnancy.  Make sure that your health care provider knows about and approves of all the medicines, supplements, vitamins, eye drops, and creams that you use. This information is not intended to replace advice given to you by your health care provider. Make sure you discuss any questions you have with your health care provider. Document Revised: 12/06/2016 Document Reviewed: 10/24/2016 Elsevier Patient Education  2020 [employer].

## 2019-12-09 ENCOUNTER — Other Ambulatory Visit: Payer: Self-pay | Admitting: *Deleted

## 2019-12-09 DIAGNOSIS — O365930 Maternal care for other known or suspected poor fetal growth, third trimester, not applicable or unspecified: Secondary | ICD-10-CM

## 2019-12-13 ENCOUNTER — Inpatient Hospital Stay (HOSPITAL_COMMUNITY)
Admission: AD | Admit: 2019-12-13 | Discharge: 2019-12-13 | Disposition: A | Discharge status: Home / Self Care | Payer: Medicaid Other | Attending: Family Medicine | Admitting: Family Medicine

## 2019-12-13 ENCOUNTER — Other Ambulatory Visit: Payer: Self-pay

## 2019-12-13 ENCOUNTER — Encounter (HOSPITAL_COMMUNITY): Payer: Self-pay | Admitting: Obstetrics and Gynecology

## 2019-12-13 DIAGNOSIS — O26893 Other specified pregnancy related conditions, third trimester: Secondary | ICD-10-CM | POA: Insufficient documentation

## 2019-12-13 DIAGNOSIS — Z3A33 33 weeks gestation of pregnancy: Secondary | ICD-10-CM | POA: Insufficient documentation

## 2019-12-13 DIAGNOSIS — R109 Unspecified abdominal pain: Secondary | ICD-10-CM | POA: Insufficient documentation

## 2019-12-13 DIAGNOSIS — R110 Nausea: Secondary | ICD-10-CM | POA: Diagnosis not present

## 2019-12-13 DIAGNOSIS — Z8759 Personal history of other complications of pregnancy, childbirth and the puerperium: Secondary | ICD-10-CM

## 2019-12-13 HISTORY — DX: Unspecified pre-eclampsia, complicating the puerperium: O14.95

## 2019-12-13 LAB — URINALYSIS, ROUTINE W REFLEX MICROSCOPIC
Bilirubin Urine: NEGATIVE
Glucose, UA: NEGATIVE mg/dL
Hgb urine dipstick: NEGATIVE
Ketones, ur: 5 mg/dL — AB
Leukocytes,Ua: NEGATIVE
Nitrite: NEGATIVE
Protein, ur: 30 mg/dL — AB
Specific Gravity, Urine: 1.021 (ref 1.005–1.030)
pH: 8 (ref 5.0–8.0)

## 2019-12-13 LAB — WET PREP, GENITAL
Clue Cells Wet Prep HPF POC: NONE SEEN
Sperm: NONE SEEN
Trich, Wet Prep: NONE SEEN
Yeast Wet Prep HPF POC: NONE SEEN

## 2019-12-13 MED ORDER — LACTATED RINGERS IV BOLUS
1000.0000 mL | Freq: Once | INTRAVENOUS | Status: AC
  Administered 2019-12-13: 1000 mL via INTRAVENOUS

## 2019-12-13 MED ORDER — ONDANSETRON HCL 4 MG/2ML IJ SOLN
4.0000 mg | Freq: Once | INTRAMUSCULAR | Status: AC
  Administered 2019-12-13: 4 mg via INTRAVENOUS
  Filled 2019-12-13: qty 2

## 2019-12-13 NOTE — Discharge Instructions (Signed)
Abdominal Pain During Pregnancy  Belly (abdominal) pain is common during pregnancy. There are many possible causes. Most of the time, it is not a serious problem. Other times, it can be a sign that something is wrong with the pregnancy. Always tell your doctor if you have belly pain. Follow these instructions at home:  Do not have sex or put anything in your vagina until your pain goes away completely.  Get plenty of rest until your pain gets better.  Drink enough fluid to keep your pee (urine) pale yellow.  Take over-the-counter and prescription medicines only as told by your doctor.  Keep all follow-up visits as told by your doctor. This is important. Contact a doctor if:  Your pain continues or gets worse after resting.  You have lower belly pain that: ? Comes and goes at regular times. ? Spreads to your back. ? Feels like menstrual cramps.  You have pain or burning when you pee (urinate). Get help right away if:  You have a fever or chills.  You have vaginal bleeding.  You are leaking fluid from your vagina.  You are passing tissue from your vagina.  You throw up (vomit) for more than 24 hours.  You have watery poop (diarrhea) for more than 24 hours.  Your baby is moving less than usual.  You feel very weak or faint.  You have shortness of breath.  You have very bad pain in your upper belly. Summary  Belly (abdominal) pain is common during pregnancy. There are many possible causes.  If you have belly pain during pregnancy, tell your doctor right away.  Keep all follow-up visits as told by your doctor. This is important. This information is not intended to replace advice given to you by your health care provider. Make sure you discuss any questions you have with your health care provider. Document Revised: 04/13/2018 Document Reviewed: 03/28/2016 Elsevier Patient Education  2020 Elsevier Inc.  

## 2019-12-13 NOTE — Progress Notes (Signed)
Wet prep &GC/Chlamydia cultures obtained by Dr. Adrian Blackwater

## 2019-12-13 NOTE — MAU Provider Note (Signed)
History     CSN: [id number]  Arrival date and time: 12/13/19 1026   First Provider Initiated Contact with Patient 12/13/19 1242      Chief Complaint  Patient presents with  . Contractions  . Dizziness  . Fatigue  . Back Pain   HPI This is a 24yo [id number] at 33w today. Pregnancy complicated with h/o preeclampsia and FGR with this pregnancy (EFW 1.6%). Woke this morning with contractions and cramping that radiated into back. Continues for several hours before improving. Still is nauseated. No decreased fetal movement, no leaking fluid or bleeding.   OB History    Gravida  6   Para  4   Term  4   Preterm  0   AB  1   Living  4     SAB  1   TAB  0   Ectopic  0   Multiple  0   Live Births  4           Past Medical History:  Diagnosis Date  . Asthma    Albuterol INH prn  . Blood transfusion without reported diagnosis   . Chlamydia 2012   hasn't picked up rx, not treated yet  . 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 Preeclampsia in postpartum period    Patient reported  . Preterm labor 2011    Past Surgical History:  Procedure Laterality Date  . CESAREAN SECTION    . CESAREAN SECTION N/A 05/26/2016   Procedure: CESAREAN SECTION;  Surgeon: Florian Buff, MD;  Location: [facility];  Service: Obstetrics;  Laterality: N/A;  . CESAREAN SECTION N/A 02/05/2018   Procedure: CESAREAN SECTION;  Surgeon: Sloan Leiter, MD;  Location: [place];  Service: Obstetrics;  Laterality: N/A;  . TONSILLECTOMY AND ADENOIDECTOMY      Family History  Problem Relation Age of Onset  . Diabetes Maternal Grandmother   . Heart disease Maternal Grandfather   . Cancer Maternal Grandfather   . Heart disease Paternal Grandfather   . Cancer Father     Social History   Tobacco Use  . Smoking status: Former Smoker    Types: Cigars    Quit date: 05/19/2019    Years since quitting: 0.5  . Smokeless tobacco: Never Used  . Tobacco comment: started again  02/11/2018  Vaping Use  . Vaping Use: Former  Substance Use Topics  . Alcohol use: Not Currently    Comment: occasional  . Drug use: Not Currently    Types: Marijuana    Comment: last smoked in May 2021    Allergies:  Allergies  Allergen Reactions  . Penicillins Hives    Patient states that she is allergic to all penicillins. Has patient had a PCN reaction causing immediate rash, facial/tongue/throat swelling, SOB or lightheadedness with hypotension: YES Has patient had a PCN reaction causing severe rash involving mucus membranes or skin necrosis: NO Has patient had a PCN reaction that required hospitalization NO Has patient had a PCN reaction occurring within the last 10 years:NO If all of the above answers are "NO", then may proceed with Cephalosporin use.    Medications Prior to Admission  Medication Sig Dispense Refill Last Dose  . Doxylamine-Pyridoxine (DICLEGIS) 10-10 MG TBEC 1 tab in AM, 1 tab mid afternoon 2 tabs at bedtime. Max dose 4 tabs daily. 100 tablet 5 Past Month at Unknown time  . Prenatal Vit-Fe Phos-FA-Omega (VITAFOL GUMMIES) 3.33-0.333-34.8 MG CHEW Chew 3 tablets  by mouth daily before breakfast. 90 tablet 11 Past Week at 12/07/19  . Blood Pressure Monitor KIT 1 Device by Does not apply route once a week. To be monitored Regularly at home. (Patient not taking: Reported on 12/08/2019) 1 kit 0 More than a month at Unknown time  . Elastic Bandages & Supports (COMFORT FIT MATERNITY SUPP MED) MISC Use for support in pregnancy.  Size to fit patient. (Patient not taking: Reported on 12/08/2019) 1 each 0   . famotidine (PEPCID) 20 MG tablet Take 1 tablet (20 mg total) by mouth once for 1 dose. 30 tablet 0   . ferrous sulfate 325 (65 FE) MG tablet Take 1 tablet (325 mg total) by mouth daily. (Patient not taking: Reported on 11/17/2019) 30 tablet 0 More than a month at Unknown time  . promethazine (PHENERGAN) 25 MG tablet Take 1 tablet (25 mg total) by mouth once for 1 dose. 30  tablet 0     Review of Systems Physical Exam   Blood pressure 114/63, pulse 79, temperature 98.2 F (36.8 C), temperature source Oral, resp. rate 16, height 5' (1.524 m), weight 76.1 kg, last menstrual period 03/29/2019, SpO2 100 %, not currently breastfeeding.  Physical Exam Vitals reviewed.  Constitutional:      Appearance: Normal appearance.  Pulmonary:     Effort: Pulmonary effort is normal.  Abdominal:     General: Abdomen is flat. There is no distension.     Palpations: There is no mass.     Tenderness: There is no abdominal tenderness. There is no guarding or rebound.     Hernia: No hernia is present.  Neurological:     Mental Status: She is alert.  Psychiatric:        Mood and Affect: Mood normal.        Behavior: Behavior normal.        Thought Content: Thought content normal.        Judgment: Judgment normal.    Results for orders placed or performed during the hospital encounter of 12/13/19 (from the past 24 hour(s))  Urinalysis, Routine w reflex microscopic Urine, Clean Catch     Status: Abnormal   Collection Time: 12/13/19 11:54 AM  Result Value Ref Range   Color, Urine YELLOW YELLOW   APPearance HAZY (A) CLEAR   Specific Gravity, Urine 1.021 1.005 - 1.030   pH 8.0 5.0 - 8.0   Glucose, UA NEGATIVE NEGATIVE mg/dL   Hgb urine dipstick NEGATIVE NEGATIVE   Bilirubin Urine NEGATIVE NEGATIVE   Ketones, ur 5 (A) NEGATIVE mg/dL   Protein, ur 30 (A) NEGATIVE mg/dL   Nitrite NEGATIVE NEGATIVE   Leukocytes,Ua NEGATIVE NEGATIVE   RBC / HPF 0-5 0 - 5 RBC/hpf   WBC, UA 0-5 0 - 5 WBC/hpf   Bacteria, UA RARE (A) NONE SEEN   Squamous Epithelial / LPF 0-5 0 - 5   Mucus PRESENT   Wet prep, genital     Status: Abnormal   Collection Time: 12/13/19  2:24 PM  Result Value Ref Range   Yeast Wet Prep HPF POC NONE SEEN NONE SEEN   Trich, Wet Prep NONE SEEN NONE SEEN   Clue Cells Wet Prep HPF POC NONE SEEN NONE SEEN   WBC, Wet Prep HPF POC MANY (A) NONE SEEN   Sperm NONE  SEEN      MAU Course  Procedures NST reactive  MDM   Assessment and Plan     ICD-10-CM   1. [redacted] weeks  gestation of pregnancy  Z3A.33   2. History of pre-eclampsia  Z87.59   3. Nausea  R11.0   4. Abdominal cramping affecting pregnancy  O26.899    R10.9    Stable after IVF. No contractions or cervical change Discharge to home.  Truett Mainland 12/13/2019, 12:48 PM

## 2019-12-13 NOTE — MAU Note (Signed)
About 5:30 this morning started having contractions, were q 10 min, continued for 2 hrs, have slowed down.  Feeling pressure. Feels weak, dizzy. A lot of discomfort.

## 2019-12-17 ENCOUNTER — Encounter: Payer: Self-pay | Admitting: *Deleted

## 2019-12-17 ENCOUNTER — Other Ambulatory Visit: Payer: Self-pay

## 2019-12-17 ENCOUNTER — Ambulatory Visit: Payer: Medicaid Other | Admitting: *Deleted

## 2019-12-17 ENCOUNTER — Ambulatory Visit: Payer: Medicaid Other | Attending: Obstetrics and Gynecology

## 2019-12-17 DIAGNOSIS — Z8759 Personal history of other complications of pregnancy, childbirth and the puerperium: Secondary | ICD-10-CM

## 2019-12-17 DIAGNOSIS — Z362 Encounter for other antenatal screening follow-up: Secondary | ICD-10-CM

## 2019-12-17 DIAGNOSIS — O34219 Maternal care for unspecified type scar from previous cesarean delivery: Secondary | ICD-10-CM | POA: Diagnosis not present

## 2019-12-17 DIAGNOSIS — O365930 Maternal care for other known or suspected poor fetal growth, third trimester, not applicable or unspecified: Secondary | ICD-10-CM | POA: Diagnosis present

## 2019-12-17 DIAGNOSIS — Z3A33 33 weeks gestation of pregnancy: Secondary | ICD-10-CM | POA: Diagnosis not present

## 2019-12-22 ENCOUNTER — Encounter: Payer: Self-pay | Admitting: Obstetrics

## 2019-12-22 ENCOUNTER — Ambulatory Visit (INDEPENDENT_AMBULATORY_CARE_PROVIDER_SITE_OTHER): Payer: Medicaid Other | Admitting: Obstetrics

## 2019-12-22 ENCOUNTER — Other Ambulatory Visit: Payer: Self-pay

## 2019-12-22 VITALS — BP 113/67 | HR 83 | Wt 174.4 lb

## 2019-12-22 DIAGNOSIS — O365930 Maternal care for other known or suspected poor fetal growth, third trimester, not applicable or unspecified: Secondary | ICD-10-CM

## 2019-12-22 DIAGNOSIS — Z3490 Encounter for supervision of normal pregnancy, unspecified, unspecified trimester: Secondary | ICD-10-CM

## 2019-12-22 DIAGNOSIS — Z98891 History of uterine scar from previous surgery: Secondary | ICD-10-CM

## 2019-12-22 DIAGNOSIS — Z8759 Personal history of other complications of pregnancy, childbirth and the puerperium: Secondary | ICD-10-CM

## 2019-12-22 DIAGNOSIS — R12 Heartburn: Secondary | ICD-10-CM

## 2019-12-22 DIAGNOSIS — O26899 Other specified pregnancy related conditions, unspecified trimester: Secondary | ICD-10-CM

## 2019-12-22 DIAGNOSIS — O0993 Supervision of high risk pregnancy, unspecified, third trimester: Secondary | ICD-10-CM

## 2019-12-22 MED ORDER — ASPIRIN 81 MG PO CHEW
81.0000 mg | CHEWABLE_TABLET | Freq: Every day | ORAL | 5 refills | Status: DC
Start: 2019-12-22 — End: 2020-01-15

## 2019-12-22 MED ORDER — OMEPRAZOLE 20 MG PO CPDR: 20.0000 mg | DELAYED_RELEASE_CAPSULE | Freq: Two times a day (BID) | ORAL | 5 refills | Status: DC

## 2019-12-22 MED ORDER — VITAFOL GUMMIES 3.33-0.333-34.8 MG PO CHEW: 3.0000 | CHEWABLE_TABLET | Freq: Every day | ORAL | 11 refills | Status: AC

## 2019-12-22 NOTE — Progress Notes (Signed)
Patient presents for ROB. Patient has no concerns today. 

## 2019-12-22 NOTE — Progress Notes (Signed)
Subjective:  Cindy Pearson is a 24 y.o. [phone] at [redacted]w[redacted]d being seen today for ongoing prenatal care.  She is currently monitored for the following issues for this high-risk pregnancy and has IUGR (intrauterine growth restriction) affecting care of mother; S/P cesarean section; Chlamydia; Supervision of other normal pregnancy, antepartum; Postpartum severe preeclampsia; Supervision of normal pregnancy, antepartum; History of pre-eclampsia; LGSIL on Pap smear of cervix; and [redacted] weeks gestation of pregnancy on their problem list.  Patient reports backache and heartburn.  Contractions: Not present. Vag. Bleeding: None.  Movement: Present. Denies leaking of fluid.   The following portions of the patient's history were reviewed and updated as appropriate: allergies, current medications, past family history, past medical history, past social history, past surgical history and problem list. Problem list updated.  Objective:   Vitals:   12/22/19 0929  BP: 113/67  Pulse: 83  Weight: 174 lb 6.4 oz (79.1 kg)    Fetal Status:     Movement: Present     General:  Alert, oriented and cooperative. Patient is in no acute distress.  Skin: Skin is warm and dry. No rash noted.   Cardiovascular: Normal heart rate noted  Respiratory: Normal respiratory effort, no problems with respiration noted  Abdomen: Soft, gravid, appropriate for gestational age. Pain/Pressure: Present     Pelvic:  Cervical exam deferred        Extremities: Normal range of motion.  Edema: None  Mental Status: Normal mood and affect. Normal behavior. Normal judgment and thought content.   Urinalysis:      Assessment and Plan:  Pregnancy: [id number] at [redacted]w[redacted]d  1. Supervision of high risk pregnancy in third trimester  2. History of cesarean section x 3, low transverse  - wants BTL with repeat C/S - tubal papers signed today  3. History of pre-eclampsia Rx: - aspirin 81 MG chewable tablet; Chew 1 tablet (81 mg total) by mouth daily.   Dispense: 30 tablet; Refill: 5  4. Poor fetal growth affecting management of mother in third trimester, single or unspecified fetus - weekly fetal assessment by MFM - interval growth ultrasound q 3 weeks - delivery recommended at 37 weeks  5. Heartburn during pregnancy, antepartum Rx: - omeprazole (PRILOSEC) 20 MG capsule; Take 1 capsule (20 mg total) by mouth 2 (two) times daily before a meal.  Dispense: 60 capsule; Refill: 5   Preterm labor symptoms and general obstetric precautions including but not limited to vaginal bleeding, contractions, leaking of fluid and fetal movement were reviewed in detail with the patient. Please refer to After Visit Summary for other counseling recommendations.   Return in about 2 weeks (around 01/05/2020) for [facility].   Brock Bad, MD  12/22/19

## 2019-12-24 ENCOUNTER — Ambulatory Visit: Payer: Medicaid Other

## 2019-12-30 ENCOUNTER — Ambulatory Visit: Payer: Medicaid Other | Admitting: *Deleted

## 2019-12-30 ENCOUNTER — Other Ambulatory Visit: Payer: Self-pay | Admitting: Maternal & Fetal Medicine

## 2019-12-30 ENCOUNTER — Ambulatory Visit: Payer: Medicaid Other | Attending: Obstetrics and Gynecology

## 2019-12-30 ENCOUNTER — Other Ambulatory Visit: Payer: Self-pay

## 2019-12-30 ENCOUNTER — Encounter: Payer: Self-pay | Admitting: *Deleted

## 2019-12-30 DIAGNOSIS — Z3A35 35 weeks gestation of pregnancy: Secondary | ICD-10-CM | POA: Diagnosis not present

## 2019-12-30 DIAGNOSIS — Z8759 Personal history of other complications of pregnancy, childbirth and the puerperium: Secondary | ICD-10-CM | POA: Insufficient documentation

## 2019-12-30 DIAGNOSIS — O365930 Maternal care for other known or suspected poor fetal growth, third trimester, not applicable or unspecified: Secondary | ICD-10-CM

## 2019-12-30 DIAGNOSIS — O34219 Maternal care for unspecified type scar from previous cesarean delivery: Secondary | ICD-10-CM | POA: Diagnosis not present

## 2019-12-30 DIAGNOSIS — O365990 Maternal care for other known or suspected poor fetal growth, unspecified trimester, not applicable or unspecified: Secondary | ICD-10-CM

## 2019-12-30 DIAGNOSIS — O09293 Supervision of pregnancy with other poor reproductive or obstetric history, third trimester: Secondary | ICD-10-CM

## 2019-12-30 NOTE — Procedures (Signed)
Cindy Pearson 05-17-1995 [redacted]w[redacted]d  Fetus A Non-Stress Test Interpretation for 12/30/19  Indication: IUGR  Fetal Heart Rate A Mode: External Baseline Rate (A): 135 bpm Variability: Moderate Accelerations: 15 x 15 Decelerations: None Multiple birth?: No  Uterine Activity Mode: Palpation,Toco Contraction Frequency (min): U/I Contraction Duration (sec): 30-50 Contraction Quality: Mild Resting Tone Palpated: Relaxed Resting Time: Adequate  Interpretation (Fetal Testing) Nonstress Test Interpretation: Reactive Overall Impression: Reassuring for gestational age Comments: Reviewed tracing with Dr. Judeth Cornfield

## 2020-01-02 ENCOUNTER — Inpatient Hospital Stay (HOSPITAL_COMMUNITY)
Admission: AD | Admit: 2020-01-02 | Discharge: 2020-01-02 | Disposition: A | Discharge status: Home / Self Care | Payer: Medicaid Other | Attending: Obstetrics and Gynecology | Admitting: Obstetrics and Gynecology

## 2020-01-02 ENCOUNTER — Other Ambulatory Visit: Payer: Self-pay

## 2020-01-02 ENCOUNTER — Encounter (HOSPITAL_COMMUNITY): Payer: Self-pay | Admitting: Obstetrics and Gynecology

## 2020-01-02 DIAGNOSIS — Z87891 Personal history of nicotine dependence: Secondary | ICD-10-CM | POA: Diagnosis not present

## 2020-01-02 DIAGNOSIS — O26893 Other specified pregnancy related conditions, third trimester: Secondary | ICD-10-CM | POA: Insufficient documentation

## 2020-01-02 DIAGNOSIS — O98513 Other viral diseases complicating pregnancy, third trimester: Secondary | ICD-10-CM | POA: Diagnosis not present

## 2020-01-02 DIAGNOSIS — O99891 Other specified diseases and conditions complicating pregnancy: Secondary | ICD-10-CM | POA: Insufficient documentation

## 2020-01-02 DIAGNOSIS — U071 COVID-19: Secondary | ICD-10-CM | POA: Diagnosis not present

## 2020-01-02 DIAGNOSIS — R519 Headache, unspecified: Secondary | ICD-10-CM | POA: Diagnosis not present

## 2020-01-02 DIAGNOSIS — R102 Pelvic and perineal pain: Secondary | ICD-10-CM | POA: Diagnosis not present

## 2020-01-02 DIAGNOSIS — Z3A35 35 weeks gestation of pregnancy: Secondary | ICD-10-CM | POA: Diagnosis not present

## 2020-01-02 DIAGNOSIS — O212 Late vomiting of pregnancy: Secondary | ICD-10-CM | POA: Insufficient documentation

## 2020-01-02 LAB — URINALYSIS, ROUTINE W REFLEX MICROSCOPIC
Bilirubin Urine: NEGATIVE
Glucose, UA: NEGATIVE mg/dL
Hgb urine dipstick: NEGATIVE
Ketones, ur: NEGATIVE mg/dL
Leukocytes,Ua: NEGATIVE
Nitrite: NEGATIVE
Protein, ur: NEGATIVE mg/dL
Specific Gravity, Urine: 1.019 (ref 1.005–1.030)
pH: 8 (ref 5.0–8.0)

## 2020-01-02 LAB — RESP PANEL BY RT-PCR (FLU A&B, COVID) ARPGX2
Influenza A by PCR: NEGATIVE
Influenza B by PCR: NEGATIVE
SARS Coronavirus 2 by RT PCR: POSITIVE — AB

## 2020-01-02 MED ORDER — CYCLOBENZAPRINE HCL 5 MG PO TABS
10.0000 mg | ORAL_TABLET | Freq: Once | ORAL | Status: AC
  Administered 2020-01-02: 10:00:00 10 mg via ORAL
  Filled 2020-01-02: qty 2

## 2020-01-02 MED ORDER — METHYLPREDNISOLONE SODIUM SUCC 125 MG IJ SOLR: 125.0000 mg | Freq: Once | INTRAMUSCULAR | Status: DC | PRN

## 2020-01-02 MED ORDER — EPINEPHRINE 0.3 MG/0.3ML IJ SOAJ
0.3000 mg | Freq: Once | INTRAMUSCULAR | Status: DC | PRN
  Filled 2020-01-02: qty 0.6

## 2020-01-02 MED ORDER — DIPHENHYDRAMINE HCL 50 MG/ML IJ SOLN: 50.0000 mg | Freq: Once | INTRAMUSCULAR | Status: DC | PRN

## 2020-01-02 MED ORDER — FAMOTIDINE IN NACL 20-0.9 MG/50ML-% IV SOLN: 20.0000 mg | Freq: Once | INTRAVENOUS | Status: DC | PRN

## 2020-01-02 MED ORDER — SODIUM CHLORIDE 0.9 % IV SOLN
Freq: Once | INTRAVENOUS | Status: DC
  Filled 2020-01-02: qty 5

## 2020-01-02 MED ORDER — SODIUM CHLORIDE 0.9 % IV SOLN: INTRAVENOUS | Status: DC | PRN

## 2020-01-02 MED ORDER — ALBUTEROL SULFATE HFA 108 (90 BASE) MCG/ACT IN AERS
2.0000 | INHALATION_SPRAY | Freq: Once | RESPIRATORY_TRACT | Status: DC | PRN
  Filled 2020-01-02: qty 6.7

## 2020-01-02 NOTE — MAU Provider Note (Signed)
History     CSN: [id number]  Arrival date and time: 01/02/20 0751   Event Date/Time   First Provider Initiated Contact with Patient 01/02/20 [phone]      Chief Complaint  Patient presents with  . Back Pain  . Emesis  . Nausea  . Headache   Cindy Pearson is a 24 y.o. [phone] at 22w6dwho presents today with back pain, pelvic pressure, muscle spams in her left arm and leg, feeling uncomfortable all the time, diarrhea, nausea, hot and cold flashes. She states that all this started on 12/31/2019. She reports she is unable to sleep and has to toss and turn all night long. She states that it feels like the baby is about to come. She denies any LOF or VB. She reports normal fetal movement.   Pelvic Pain The patient's primary symptoms include pelvic pain. The patient's pertinent negatives include no vaginal bleeding. This is a new problem. The current episode started in the past 7 days. The problem occurs constantly. The problem has been unchanged. The problem affects both sides. She is pregnant. Associated symptoms include abdominal pain, constipation, headaches, nausea and vomiting. Pertinent negatives include no chills or fever. The vaginal discharge was normal. The symptoms are aggravated by activity. She has tried nothing for the symptoms.    OB History    Gravida  6   Para  4   Term  4   Preterm  0   AB  1   Living  4     SAB  1   IAB  0   Ectopic  0   Multiple  0   Live Births  4           Past Medical History:  Diagnosis Date  . Asthma    Albuterol INH prn  . Blood transfusion without reported diagnosis   . Chlamydia 2012   hasn't picked up rx, not treated yet  . 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 Preeclampsia in postpartum period    Patient reported  . Preterm labor 2011    Past Surgical History:  Procedure Laterality Date  . CESAREAN SECTION    . CESAREAN SECTION N/A 05/26/2016   Procedure: CESAREAN SECTION;  Surgeon: EFlorian Buff MD;   [place] [facility]  Service: Obstetrics;  Laterality: N/A;  . CESAREAN SECTION N/A 02/05/2018   Procedure: CESAREAN SECTION;  Surgeon: DSloan Leiter MD;  [place] [facility]  Service: Obstetrics;  Laterality: N/A;  . TONSILLECTOMY AND ADENOIDECTOMY      Family History  Problem Relation Age of Onset  . Diabetes Maternal Grandmother   . Heart disease Maternal Grandfather   . Cancer Maternal Grandfather   . Heart disease Paternal Grandfather   . Cancer Father     Social History   Tobacco Use  . Smoking status: Former Smoker    Types: Cigars    Quit date: 05/19/2019    Years since quitting: 0.6  . Smokeless tobacco: Never Used  . Tobacco comment: started again 02/11/2018  Vaping Use  . Vaping Use: Former  Substance Use Topics  . Alcohol use: Not Currently    Comment: occasional  . Drug use: Not Currently    Types: Marijuana    Comment: last smoked in May 2021    Allergies:  Allergies  Allergen Reactions  . Penicillins Hives    Patient states that she is allergic to all penicillins. Has patient had a  PCN reaction causing immediate rash, facial/tongue/throat swelling, SOB or lightheadedness with hypotension: YES Has patient had a PCN reaction causing severe rash involving mucus membranes or skin necrosis: NO Has patient had a PCN reaction that required hospitalization NO Has patient had a PCN reaction occurring within the last 10 years:NO If all of the above answers are "NO", then may proceed with Cephalosporin use.    Medications Prior to Admission  Medication Sig Dispense Refill Last Dose  . aspirin 81 MG chewable tablet Chew 1 tablet (81 mg total) by mouth daily. 30 tablet 5   . Blood Pressure Monitor KIT 1 Device by Does not apply route once a week. To be monitored Regularly at home. 1 kit 0   . Doxylamine-Pyridoxine (DICLEGIS) 10-10 MG TBEC 1 tab in AM, 1 tab mid afternoon 2 tabs at bedtime. Max dose 4 tabs daily. 100 tablet 5   . Elastic Bandages &  Supports (COMFORT FIT MATERNITY SUPP MED) MISC Use for support in pregnancy.  Size to fit patient. (Patient taking differently: Use for support in pregnancy.  Size to fit patient.) 1 each 0   . famotidine (PEPCID) 20 MG tablet Take 1 tablet (20 mg total) by mouth once for 1 dose. 30 tablet 0   . ferrous sulfate 325 (65 FE) MG tablet Take 1 tablet (325 mg total) by mouth daily. (Patient not taking: Reported on 12/30/2019) 30 tablet 0   . omeprazole (PRILOSEC) 20 MG capsule Take 1 capsule (20 mg total) by mouth 2 (two) times daily before a meal. 60 capsule 5   . Prenatal Vit-Fe Phos-FA-Omega (VITAFOL GUMMIES) 3.33-0.333-34.8 MG CHEW Chew 3 tablets by mouth daily before breakfast. 90 tablet 11   . promethazine (PHENERGAN) 25 MG tablet Take 1 tablet (25 mg total) by mouth once for 1 dose. 30 tablet 0     Review of Systems  Constitutional: Negative for chills and fever.  Gastrointestinal: Positive for abdominal pain, constipation, nausea and vomiting.  Genitourinary: Positive for pelvic pain.  Neurological: Positive for headaches.   Physical Exam   Blood pressure 105/62, pulse (!) 117, temperature 98.6 F (37 C), temperature source Oral, resp. rate 16, last menstrual period 03/29/2019, SpO2 98 %, not currently breastfeeding.  Physical Exam Vitals and nursing note reviewed. Exam conducted with a chaperone present.  Constitutional:      General: She is not in acute distress. HENT:     Head: Normocephalic.  Cardiovascular:     Rate and Rhythm: Normal rate.  Pulmonary:     Effort: Pulmonary effort is normal.  Abdominal:     Palpations: Abdomen is soft.     Tenderness: There is no abdominal tenderness.  Genitourinary:    Comments:  Dilation: Fingertip Effacement (%): Thick Exam by:: Lyrah Bradt cnm  Skin:    General: Skin is warm and dry.  Neurological:     Mental Status: She is alert and oriented to person, place, and time.  Psychiatric:        Mood and Affect: Mood normal.         Behavior: Behavior normal.     NST:  Baseline: 140 Variability: moderate Accels: 15x15 Decels: none Toco: none Reactive/Appropriate for GA   Results for orders placed or performed during the hospital encounter of 01/02/20 (from the past 24 hour(s))  Urinalysis, Routine w reflex microscopic Urine, Clean Catch     Status: Abnormal   Collection Time: 01/02/20  8:37 AM  Result Value Ref Range   Color, Urine  YELLOW YELLOW   APPearance HAZY (A) CLEAR   Specific Gravity, Urine 1.019 1.005 - 1.030   pH 8.0 5.0 - 8.0   Glucose, UA NEGATIVE NEGATIVE mg/dL   Hgb urine dipstick NEGATIVE NEGATIVE   Bilirubin Urine NEGATIVE NEGATIVE   Ketones, ur NEGATIVE NEGATIVE mg/dL   Protein, ur NEGATIVE NEGATIVE mg/dL   Nitrite NEGATIVE NEGATIVE   Leukocytes,Ua NEGATIVE NEGATIVE  Resp Panel by RT-PCR (Flu A&B, Covid) Nasopharyngeal Swab     Status: Abnormal   Collection Time: 01/02/20  9:36 AM   Specimen: Nasopharyngeal Swab; Nasopharyngeal(NP) swabs in vial transport medium  Result Value Ref Range   SARS Coronavirus 2 by RT PCR POSITIVE (A) NEGATIVE   Influenza A by PCR NEGATIVE NEGATIVE   Influenza B by PCR NEGATIVE NEGATIVE     MAU Course  Procedures  MDM  11:10 AM reviewed +covid results with patient. Offered Mab Infusion today. Patient would like to have Mab at this time.   12:17 PM patient now states that she does not want to have infusion done here today. She would like to have it outpatient. Message sent to the Mab infusion staff to see if they can get her in there for infusion.   Assessment and Plan    1. COVID-19 affecting pregnancy in third trimester   2. [redacted] weeks gestation of pregnancy     DC home Comfort measures reviewed  Safe pregnancy medication list given to patient  PTL precautions  Fetal kick counts RX: none  Return to MAU as needed MAB infusion will call you with an appt.    Follow-up [place] at [facility] Follow up.    Specialty: Internal Medicine Contact information: [place]

## 2020-01-02 NOTE — Discharge Instructions (Signed)
Person Under Monitoring Name: Cindy Pearson  Location: [address] [place] [address]   Infection Prevention Recommendations for Individuals Confirmed to have, or Being Evaluated for, 2019 Novel Coronavirus (COVID-19) Infection Who Receive Care at Home  Individuals who are confirmed to have, or are being evaluated for, COVID-19 should follow the prevention steps below until a healthcare provider or local or state health department says they can return to normal activities.  Stay home except to get medical care You should restrict activities outside your home, except for getting medical care. Do not go to work, school, or public areas, and do not use public transportation or taxis.  Call ahead before visiting your doctor Before your medical appointment, call the healthcare provider and tell them that you have, or are being evaluated for, COVID-19 infection. This will help the healthcare provider's office take steps to keep other people from getting infected. Ask your healthcare provider to call the local or state health department.  Monitor your symptoms Seek prompt medical attention if your illness is worsening (e.g., difficulty breathing). Before going to your medical appointment, call the healthcare provider and tell them that you have, or are being evaluated for, COVID-19 infection. Ask your healthcare provider to call the local or state health department.  Wear a facemask You should wear a facemask that covers your nose and mouth when you are in the same room with other people and when you visit a healthcare provider. People who live with or visit you should also wear a facemask while they are in the same room with you.  Separate yourself from other people in your home As much as possible, you should stay in a different room from other people in your home. Also, you should use a separate bathroom, if available.  Avoid sharing household items You should not share  dishes, drinking glasses, cups, eating utensils, towels, bedding, or other items with other people in your home. After using these items, you should wash them thoroughly with soap and water.  Cover your coughs and sneezes Cover your mouth and nose with a tissue when you cough or sneeze, or you can cough or sneeze into your sleeve. Throw used tissues in a lined trash can, and immediately wash your hands with soap and water for at least 20 seconds or use an alcohol-based hand rub.  Wash your [employer] your hands often and thoroughly with soap and water for at least 20 seconds. You can use an alcohol-based hand sanitizer if soap and water are not available and if your hands are not visibly dirty. Avoid touching your eyes, nose, and mouth with unwashed hands.   Prevention Steps for Caregivers and Household Members of Individuals Confirmed to have, or Being Evaluated for, COVID-19 Infection Being Cared for in the Home  If you live with, or provide care at home for, a person confirmed to have, or being evaluated for, COVID-19 infection please follow these guidelines to prevent infection:  Follow healthcare provider's instructions Make sure that you understand and can help the patient follow any healthcare provider instructions for all care.  Provide for the patient's basic needs You should help the patient with basic needs in the home and provide support for getting groceries, prescriptions, and other personal needs.  Monitor the patient's symptoms If they are getting sicker, call his or her medical provider and tell them that the patient has, or is being evaluated for, COVID-19 infection. This will help the healthcare provider's office  take steps to keep other people from getting infected. Ask the healthcare provider to call the local or state health department.  Limit the number of people who have contact with the patient  If possible, have only one caregiver for the patient.  Other  household members should stay in another home or place of residence. If this is not possible, they should stay  in another room, or be separated from the patient as much as possible. Use a separate bathroom, if available.  Restrict visitors who do not have an essential need to be in the home.  Keep older adults, very young children, and other sick people away from the patient Keep older adults, very young children, and those who have compromised immune systems or chronic health conditions away from the patient. This includes people with chronic heart, lung, or kidney conditions, diabetes, and cancer.  Ensure good ventilation Make sure that shared spaces in the home have good air flow, such as from an air conditioner or an opened window, weather permitting.  Wash your hands often  Wash your hands often and thoroughly with soap and water for at least 20 seconds. You can use an alcohol based hand sanitizer if soap and water are not available and if your hands are not visibly dirty.  Avoid touching your eyes, nose, and mouth with unwashed hands.  Use disposable paper towels to dry your hands. If not available, use dedicated cloth towels and replace them when they become wet.  Wear a facemask and gloves  Wear a disposable facemask at all times in the room and gloves when you touch or have contact with the patient's blood, body fluids, and/or secretions or excretions, such as sweat, saliva, sputum, nasal mucus, vomit, urine, or feces.  Ensure the mask fits over your nose and mouth tightly, and do not touch it during use.  Throw out disposable facemasks and gloves after using them. Do not reuse.  Wash your hands immediately after removing your facemask and gloves.  If your personal clothing becomes contaminated, carefully remove clothing and launder. Wash your hands after handling contaminated clothing.  Place all used disposable facemasks, gloves, and other waste in a lined container before  disposing them with other household waste.  Remove gloves and wash your hands immediately after handling these items.  Do not share dishes, glasses, or other household items with the patient  Avoid sharing household items. You should not share dishes, drinking glasses, cups, eating utensils, towels, bedding, or other items with a patient who is confirmed to have, or being evaluated for, COVID-19 infection.  After the person uses these items, you should wash them thoroughly with soap and water.  Wash laundry thoroughly  Immediately remove and wash clothes or bedding that have blood, body fluids, and/or secretions or excretions, such as sweat, saliva, sputum, nasal mucus, vomit, urine, or feces, on them.  Wear gloves when handling laundry from the patient.  Read and follow directions on labels of laundry or clothing items and detergent. In general, wash and dry with the warmest temperatures recommended on the label.  Clean all areas the individual has used often  Clean all touchable surfaces, such as counters, tabletops, doorknobs, bathroom fixtures, toilets, phones, keyboards, tablets, and bedside tables, every day. Also, clean any surfaces that may have blood, body fluids, and/or secretions or excretions on them.  Wear gloves when cleaning surfaces the patient has come in contact with.  Use a diluted bleach solution (e.g., dilute bleach with 1 part  bleach and 10 parts water) or a household disinfectant with a label that says EPA-registered for coronaviruses. To make a bleach solution at home, add 1 tablespoon of bleach to 1 quart (4 cups) of water. For a larger supply, add  cup of bleach to 1 gallon (16 cups) of water.  Read labels of cleaning products and follow recommendations provided on product labels. Labels contain instructions for safe and effective use of the cleaning product including precautions you should take when applying the product, such as wearing gloves or eye protection  and making sure you have good ventilation during use of the product.  Remove gloves and wash hands immediately after cleaning.  Monitor yourself for signs and symptoms of illness Caregivers and household members are considered close contacts, should monitor their health, and will be asked to limit movement outside of the home to the extent possible. Follow the monitoring steps for close contacts listed on the symptom monitoring form.   ? If you have additional questions, contact your local health department or call the epidemiologist on call at [phone] (available 24/7). ? This guidance is subject to change. For the most up-to-date guidance from CDC, please refer to their website: TripMetro.hu  Safe Medications in Pregnancy   Acne: Benzoyl Peroxide Salicylic Acid  Backache/Headache: Tylenol: 2 regular strength every 4 hours OR              2 Extra strength every 6 hours  Colds/Coughs/Allergies: Benadryl (alcohol free) 25 mg every 6 hours as needed Breath right strips Claritin Cepacol throat lozenges Chloraseptic throat spray Cold-Eeze- up to three times per day Cough drops, alcohol free Flonase (by prescription only) Guaifenesin Mucinex Robitussin DM (plain only, alcohol free) Saline nasal spray/drops Sudafed (pseudoephedrine) & Actifed ** use only after [redacted] weeks gestation and if you do not have high blood pressure Tylenol Vicks Vaporub Zinc lozenges Zyrtec   Constipation: Colace Ducolax suppositories Fleet enema Glycerin suppositories Metamucil Milk of magnesia Miralax Senokot Smooth move tea  Diarrhea: Kaopectate Imodium A-D  *NO pepto Bismol  Hemorrhoids: Anusol Anusol HC Preparation H Tucks  Indigestion: Tums Maalox Mylanta Zantac  Pepcid  Insomnia: Benadryl (alcohol free) 25mg  every 6 hours as needed Tylenol PM Unisom, no Gelcaps  Leg  Cramps: Tums MagGel  Nausea/Vomiting:  Bonine Dramamine Emetrol Ginger extract Sea bands Meclizine  Nausea medication to take during pregnancy:  Unisom (doxylamine succinate 25 mg tablets) Take one tablet daily at bedtime. If symptoms are not adequately controlled, the dose can be increased to a maximum recommended dose of two tablets daily (1/2 tablet in the morning, 1/2 tablet mid-afternoon and one at bedtime). Vitamin B6 100mg  tablets. Take one tablet twice a day (up to 200 mg per day).  Skin Rashes: Aveeno products Benadryl cream or 25mg  every 6 hours as needed Calamine Lotion 1% cortisone cream  Yeast infection: Gyne-lotrimin 7 Monistat 7   **If taking multiple medications, please check labels to avoid duplicating the same active ingredients **take medication as directed on the label ** Do not exceed 4000 mg of tylenol in 24 hours **Do not take medications that contain aspirin or ibuprofen

## 2020-01-02 NOTE — MAU Note (Signed)
Pt reports to mau with c/o n,v.  Since yesterday.  Pt reports constant lower back pain since yesterday and a headache that she states has been an issue throughout her pregnancy.  Pt states she has not tried to take any pain med because "it never works". Pt denies abd pain, vag bleeding, or LOF.  Reports good fetal movement.

## 2020-01-02 NOTE — MAU Note (Signed)
Pt left before dc papers could be given or reviewed.

## 2020-01-04 ENCOUNTER — Telehealth: Payer: Self-pay

## 2020-01-04 NOTE — Telephone Encounter (Signed)
Patient called to let me know that she has COVID. I spoke with Dr. Judeth Cornfield and he wants her to deliver at 37 weeks. He put that in her report and it will be discussed with her OB on Thursday. Per Dr. Judeth Cornfield, it was okay to cancel appointments.

## 2020-01-06 ENCOUNTER — Ambulatory Visit: Payer: Medicaid Other

## 2020-01-06 ENCOUNTER — Telehealth (INDEPENDENT_AMBULATORY_CARE_PROVIDER_SITE_OTHER): Payer: Medicaid Other | Admitting: Obstetrics & Gynecology

## 2020-01-06 DIAGNOSIS — Z3490 Encounter for supervision of normal pregnancy, unspecified, unspecified trimester: Secondary | ICD-10-CM

## 2020-01-06 DIAGNOSIS — Z98891 History of uterine scar from previous surgery: Secondary | ICD-10-CM

## 2020-01-06 DIAGNOSIS — O365930 Maternal care for other known or suspected poor fetal growth, third trimester, not applicable or unspecified: Secondary | ICD-10-CM

## 2020-01-06 DIAGNOSIS — O98519 Other viral diseases complicating pregnancy, unspecified trimester: Secondary | ICD-10-CM

## 2020-01-06 DIAGNOSIS — Z3A36 36 weeks gestation of pregnancy: Secondary | ICD-10-CM

## 2020-01-06 DIAGNOSIS — Z8759 Personal history of other complications of pregnancy, childbirth and the puerperium: Secondary | ICD-10-CM

## 2020-01-06 DIAGNOSIS — U071 COVID-19: Secondary | ICD-10-CM

## 2020-01-06 NOTE — Progress Notes (Signed)
Pt states she is to be delivered at 37 weeks per MFM.  Pt cannot take BP for today's visit.  Pt tested positive for Covid on 01/02/20.

## 2020-01-06 NOTE — Progress Notes (Signed)
° °  TELEHEALTH OBSTETRICS VISIT ENCOUNTER NOTE  I connected with Cindy Pearson on 01/06/20 at  8:15 AM EST by telephone at home and verified that I am speaking with the correct person using two identifiers.   I discussed the limitations, risks, security and privacy concerns of performing an evaluation and management service by telephone and the availability of in person appointments. I also discussed with the patient that there may be a patient responsible charge related to this service. The patient expressed understanding and agreed to proceed.  Subjective:  Cindy Pearson is a 24 y.o. [phone] at [redacted]w[redacted]d being followed for ongoing prenatal care.  She is currently monitored for the following issues for this high-risk pregnancy and has IUGR (intrauterine growth restriction) affecting care of mother; S/P cesarean section; Chlamydia; Supervision of other normal pregnancy, antepartum; Postpartum severe preeclampsia; Supervision of normal pregnancy, antepartum; History of pre-eclampsia; LGSIL on Pap smear of cervix; and [redacted] weeks gestation of pregnancy on their problem list.  Patient reports no complaints. Reports fetal movement. Denies any contractions, bleeding or leaking of fluid.   The following portions of the patient's history were reviewed and updated as appropriate: allergies, current medications, past family history, past medical history, past social history, past surgical history and problem list.   Objective:   General:  Alert, oriented and cooperative.   Mental Status: Normal mood and affect perceived. Normal judgment and thought content.  Rest of physical exam deferred due to type of encounter  Assessment and Plan:  Pregnancy: [id number] at [redacted]w[redacted]d 1. History of pre-eclampsia normotensive  2. Encounter for supervision of normal pregnancy, antepartum, unspecified gravidity IUGR, needs repeat CS after 37 weeks. Needs to be day 11 after covid pos on 12/26, scheduled for 01/13/2020  Preterm labor  symptoms and general obstetric precautions including but not limited to vaginal bleeding, contractions, leaking of fluid and fetal movement were reviewed in detail with the patient.  I discussed the assessment and treatment plan with the patient. The patient was provided an opportunity to ask questions and all were answered. The patient agreed with the plan and demonstrated an understanding of the instructions. The patient was advised to call back or seek an in-person office evaluation/go to MAU at [facility] for any urgent or concerning symptoms. Please refer to After Visit Summary for other counseling recommendations.   I provided 15 minutes of non-face-to-face time during this encounter.  Return if symptoms worsen or fail to improve.  No future appointments.  Scheryl Darter, MD [facility], [facility]

## 2020-01-06 NOTE — Patient Instructions (Signed)
Cesarean Delivery Cesarean birth, or cesarean delivery, is the surgical delivery of a baby through an incision in the abdomen and the uterus. This may be referred to as a C-section. This procedure may be scheduled ahead of time, or it may be done in an emergency situation. Tell a health care provider about:  Any allergies you have.  All medicines you are taking, including vitamins, herbs, eye drops, creams, and over-the-counter medicines.  Any problems you or family members have had with anesthetic medicines.  Any blood disorders you have.  Any surgeries you have had.  Any medical conditions you have.  Whether you or any members of your family have a history of deep vein thrombosis (DVT) or pulmonary embolism (PE). What are the risks? Generally, this is a safe procedure. However, problems may occur, including:  Infection.  Bleeding.  Allergic reactions to medicines.  Damage to other structures or organs.  Blood clots.  Injury to your baby. What happens before the procedure? General instructions  Follow instructions from your health care provider about eating or drinking restrictions.  If you know that you are going to have a cesarean delivery, do not shave your pubic area. Shaving before the procedure may increase your risk of infection.  Plan to have someone take you home from the hospital.  Ask your health care provider what steps will be taken to prevent infection. These may include: ? Removing hair at the surgery site. ? Washing skin with a germ-killing soap. ? Taking antibiotic medicine.  Depending on the reason for your cesarean delivery, you may have a physical exam or additional testing, such as an ultrasound.  You may have your blood or urine tested. Questions for your health care provider  Ask your health care provider about: ? Changing or stopping your regular medicines. This is especially important if you are taking diabetes medicines or blood  thinners. ? Your pain management plan. This is especially important if you plan to breastfeed your baby. ? How long you will be in the hospital after the procedure. ? Any concerns you may have about receiving blood products, if you need them during the procedure. ? Cord blood banking, if you plan to collect your baby's umbilical cord blood.  You may also want to ask your health care provider: ? Whether you will be able to hold or breastfeed your baby while you are still in the operating room. ? Whether your baby can stay with you immediately after the procedure and during your recovery. ? Whether a family member or a person of your choice can go with you into the operating room and stay with you during the procedure, immediately after the procedure, and during your recovery. What happens during the procedure?   An IV will be inserted into one of your veins.  Fluid and medicines, such as antibiotics, will be given before the surgery.  Fetal monitors will be placed on your abdomen to check your baby's heart rate.  You may be given a special warming gown to wear to keep your temperature stable.  A catheter may be inserted into your bladder through your urethra. This drains your urine during the procedure.  You may be given one or more of the following: ? A medicine to numb the area (local anesthetic). ? A medicine to make you fall asleep (general anesthetic). ? A medicine (regional anesthetic) that is injected into your back or through a small thin tube placed in your back (spinal anesthetic or epidural anesthetic).   This numbs everything below the injection site and allows you to stay awake during your procedure. If this makes you feel nauseous, tell your health care provider. Medicines will be available to help reduce any nausea you may feel.  An incision will be made in your abdomen, and then in your uterus.  If you are awake during your procedure, you may feel tugging and pulling in  your abdomen, but you should not feel pain. If you feel pain, tell your health care provider immediately.  Your baby will be removed from your uterus. You may feel more pressure or pushing while this happens.  Immediately after birth, your baby will be dried and kept warm. You may be able to hold and breastfeed your baby.  The umbilical cord may be clamped and cut during this time. This usually occurs after waiting a period of 1-2 minutes after delivery.  Your placenta will be removed from your uterus.  Your incisions will be closed with stitches (sutures). Staples, skin glue, or adhesive strips may also be applied to the incision in your abdomen.  Bandages (dressings) may be placed over the incision in your abdomen. The procedure may vary among health care providers and hospitals. What happens after the procedure?  Your blood pressure, heart rate, breathing rate, and blood oxygen level will be monitored until you are discharged from the hospital.  You may continue to receive fluids and medicines through an IV.  You will have some pain. Medicines will be available to help control your pain.  To help prevent blood clots: ? You may be given medicines. ? You may have to wear compression stockings or devices. ? You will be encouraged to walk around when you are able.  Hospital staff will encourage and support bonding with your baby. Your hospital may have you and your baby to stay in the same room (rooming in) during your hospital stay to encourage successful bonding and breastfeeding.  You may be encouraged to cough and breathe deeply often. This helps to prevent lung problems.  If you have a catheter draining your urine, it will be removed as soon as possible after your procedure. Summary  Cesarean birth, or cesarean delivery, is the surgical delivery of a baby through an incision in the abdomen and the uterus.  Follow instructions from your health care provider about eating or  drinking restrictions before the procedure.  You will have some pain after the procedure. Medicines will be available to help control your pain.  Hospital staff will encourage and support bonding with your baby after the procedure. Your hospital may have you and your baby to stay in the same room (rooming in) during your hospital stay to encourage successful bonding and breastfeeding. This information is not intended to replace advice given to you by your health care provider. Make sure you discuss any questions you have with your health care provider. Document Revised: 06/30/2017 Document Reviewed: 06/30/2017 Elsevier Patient Education  2020 Elsevier Inc.  

## 2020-01-10 ENCOUNTER — Encounter (HOSPITAL_COMMUNITY): Payer: Self-pay

## 2020-01-10 NOTE — Patient Instructions (Addendum)
Cindy Pearson  01/10/2020   Your procedure is scheduled on:  01/13/2020  Arrive at 1:15 PM at Entrance C on [address] at [facility]  and [employer]. You are invited to use the FREE valet parking or use the Visitor's parking deck.  Pick up the phone at the desk and dial [phone].  Call this number if you have problems the morning of surgery: [phone]  Remember:   Do not eat food:(After Midnight) Desps de medianoche.  Do not drink clear liquids: (6 Hours before arrival) 6 horas ante llegada.Stop at 7:15 that morning  Take these medicines the morning of surgery with A SIP OF WATER:  none   Do not wear jewelry, make-up or nail polish.  Do not wear lotions, powders, or perfumes. Do not wear deodorant.  Do not shave 48 hours prior to surgery.  Do not bring valuables to the hospital.  [facility] is not   responsible for any belongings or valuables brought to the hospital.  Contacts, dentures or bridgework may not be worn into surgery.  Leave suitcase in the car. After surgery it may be brought to your room.  For patients admitted to the hospital, checkout time is 11:00 AM the day of              discharge.      Please read over the following fact sheets that you were given:     Preparing for Surgery

## 2020-01-11 ENCOUNTER — Encounter (HOSPITAL_COMMUNITY)
Admission: RE | Admit: 2020-01-11 | Discharge: 2020-01-11 | Disposition: A | Discharge status: Home / Self Care | Payer: Medicaid Other | Source: Ambulatory Visit | Attending: Family Medicine | Admitting: Family Medicine

## 2020-01-11 ENCOUNTER — Other Ambulatory Visit: Payer: Self-pay

## 2020-01-11 DIAGNOSIS — Z01818 Encounter for other preprocedural examination: Secondary | ICD-10-CM | POA: Diagnosis present

## 2020-01-11 HISTORY — DX: Personal history of other complications of pregnancy, childbirth and the puerperium: Z87.59

## 2020-01-11 LAB — CBC
HCT: 33.5 % — ABNORMAL LOW (ref 36.0–46.0)
Hemoglobin: 11.5 g/dL — ABNORMAL LOW (ref 12.0–15.0)
MCH: 28 pg (ref 26.0–34.0)
MCHC: 34.3 g/dL (ref 30.0–36.0)
MCV: 81.5 fL (ref 80.0–100.0)
Platelets: 141 10*3/uL — ABNORMAL LOW (ref 150–400)
RBC: 4.11 MIL/uL (ref 3.87–5.11)
RDW: 13.8 % (ref 11.5–15.5)
WBC: 5 10*3/uL (ref 4.0–10.5)
nRBC: 0 % (ref 0.0–0.2)

## 2020-01-11 LAB — TYPE AND SCREEN
ABO/RH(D): B POS
Antibody Screen: NEGATIVE

## 2020-01-12 LAB — RPR: RPR Ser Ql: NONREACTIVE

## 2020-01-13 ENCOUNTER — Inpatient Hospital Stay (HOSPITAL_COMMUNITY): Payer: Medicaid Other

## 2020-01-13 ENCOUNTER — Inpatient Hospital Stay (HOSPITAL_COMMUNITY)
Admission: RE | Admit: 2020-01-13 | Discharge: 2020-01-15 | DRG: 785 | Disposition: A | Discharge status: Home / Self Care | Payer: Medicaid Other | Attending: Family Medicine | Admitting: Family Medicine

## 2020-01-13 ENCOUNTER — Encounter (HOSPITAL_COMMUNITY): Payer: Self-pay | Admitting: Family Medicine

## 2020-01-13 ENCOUNTER — Encounter (HOSPITAL_COMMUNITY)
Admission: RE | Disposition: A | Discharge status: Home / Self Care | Payer: Self-pay | Source: Home / Self Care | Attending: Family Medicine

## 2020-01-13 ENCOUNTER — Other Ambulatory Visit: Payer: Self-pay

## 2020-01-13 DIAGNOSIS — J45909 Unspecified asthma, uncomplicated: Secondary | ICD-10-CM | POA: Diagnosis present

## 2020-01-13 DIAGNOSIS — Z3A37 37 weeks gestation of pregnancy: Secondary | ICD-10-CM

## 2020-01-13 DIAGNOSIS — Z87891 Personal history of nicotine dependence: Secondary | ICD-10-CM

## 2020-01-13 DIAGNOSIS — Z302 Encounter for sterilization: Secondary | ICD-10-CM

## 2020-01-13 DIAGNOSIS — Z3480 Encounter for supervision of other normal pregnancy, unspecified trimester: Secondary | ICD-10-CM

## 2020-01-13 DIAGNOSIS — Z3490 Encounter for supervision of normal pregnancy, unspecified, unspecified trimester: Secondary | ICD-10-CM

## 2020-01-13 DIAGNOSIS — Z98891 History of uterine scar from previous surgery: Secondary | ICD-10-CM

## 2020-01-13 DIAGNOSIS — O365930 Maternal care for other known or suspected poor fetal growth, third trimester, not applicable or unspecified: Secondary | ICD-10-CM | POA: Diagnosis present

## 2020-01-13 DIAGNOSIS — O365990 Maternal care for other known or suspected poor fetal growth, unspecified trimester, not applicable or unspecified: Secondary | ICD-10-CM | POA: Diagnosis present

## 2020-01-13 DIAGNOSIS — O34211 Maternal care for low transverse scar from previous cesarean delivery: Principal | ICD-10-CM | POA: Diagnosis present

## 2020-01-13 DIAGNOSIS — O9952 Diseases of the respiratory system complicating childbirth: Secondary | ICD-10-CM | POA: Diagnosis present

## 2020-01-13 DIAGNOSIS — Z880 Allergy status to penicillin: Secondary | ICD-10-CM

## 2020-01-13 DIAGNOSIS — Z8616 Personal history of COVID-19: Secondary | ICD-10-CM

## 2020-01-13 SURGERY — Surgical Case
Anesthesia: Spinal

## 2020-01-13 MED ORDER — NALBUPHINE HCL 10 MG/ML IJ SOLN
5.0000 mg | Freq: Once | INTRAMUSCULAR | Status: DC | PRN
  Filled 2020-01-13: qty 1

## 2020-01-13 MED ORDER — OXYTOCIN-SODIUM CHLORIDE 30-0.9 UT/500ML-% IV SOLN
INTRAVENOUS | Status: AC
  Filled 2020-01-13: qty 500

## 2020-01-13 MED ORDER — ENOXAPARIN SODIUM 40 MG/0.4ML ~~LOC~~ SOLN
40.0000 mg | SUBCUTANEOUS | Status: DC
  Administered 2020-01-14 – 2020-01-15 (×2): 40 mg via SUBCUTANEOUS
  Filled 2020-01-13 (×2): qty 0.4

## 2020-01-13 MED ORDER — BUPIVACAINE HCL 0.25 % IJ SOLN
INTRAMUSCULAR | Status: DC | PRN
  Administered 2020-01-13: 30 mL

## 2020-01-13 MED ORDER — PRENATAL MULTIVITAMIN CH
1.0000 | ORAL_TABLET | Freq: Every day | ORAL | Status: DC
  Administered 2020-01-14 – 2020-01-15 (×2): 1 via ORAL
  Filled 2020-01-13 (×2): qty 1

## 2020-01-13 MED ORDER — POVIDONE-IODINE 10 % EX SWAB: 2.0000 "application " | Freq: Once | CUTANEOUS | Status: DC

## 2020-01-13 MED ORDER — GENTAMICIN SULFATE 40 MG/ML IJ SOLN
5.0000 mg/kg | INTRAVENOUS | Status: AC
  Administered 2020-01-13: 302 mg via INTRAVENOUS
  Filled 2020-01-13: qty 7.5

## 2020-01-13 MED ORDER — COCONUT OIL OIL: 1.0000 "application " | TOPICAL_OIL | Status: DC | PRN

## 2020-01-13 MED ORDER — IBUPROFEN 800 MG PO TABS
800.0000 mg | ORAL_TABLET | Freq: Four times a day (QID) | ORAL | Status: DC
  Administered 2020-01-15 (×3): 800 mg via ORAL
  Filled 2020-01-13 (×3): qty 1

## 2020-01-13 MED ORDER — SIMETHICONE 80 MG PO CHEW
80.0000 mg | CHEWABLE_TABLET | Freq: Three times a day (TID) | ORAL | Status: DC
  Administered 2020-01-13 – 2020-01-15 (×6): 80 mg via ORAL
  Filled 2020-01-13 (×6): qty 1

## 2020-01-13 MED ORDER — OXYTOCIN-SODIUM CHLORIDE 30-0.9 UT/500ML-% IV SOLN
2.5000 [IU]/h | INTRAVENOUS | Status: AC
  Administered 2020-01-13: 2.5 [IU]/h via INTRAVENOUS
  Filled 2020-01-13: qty 500

## 2020-01-13 MED ORDER — PROMETHAZINE HCL 25 MG/ML IJ SOLN: 6.2500 mg | INTRAMUSCULAR | Status: DC | PRN

## 2020-01-13 MED ORDER — SOD CITRATE-CITRIC ACID 500-334 MG/5ML PO SOLN
30.0000 mL | ORAL | Status: AC
  Administered 2020-01-13: 30 mL via ORAL

## 2020-01-13 MED ORDER — PHENYLEPHRINE HCL-NACL 20-0.9 MG/250ML-% IV SOLN
INTRAVENOUS | Status: AC
  Filled 2020-01-13: qty 250

## 2020-01-13 MED ORDER — SIMETHICONE 80 MG PO CHEW
80.0000 mg | CHEWABLE_TABLET | ORAL | Status: DC | PRN
  Administered 2020-01-15: 80 mg via ORAL
  Filled 2020-01-13: qty 1

## 2020-01-13 MED ORDER — SCOPOLAMINE 1 MG/3DAYS TD PT72
1.0000 | MEDICATED_PATCH | Freq: Once | TRANSDERMAL | Status: DC
  Administered 2020-01-13: 1.5 mg via TRANSDERMAL

## 2020-01-13 MED ORDER — NALBUPHINE HCL 10 MG/ML IJ SOLN
5.0000 mg | INTRAMUSCULAR | Status: DC | PRN
Start: 2020-01-13 — End: 2020-01-15

## 2020-01-13 MED ORDER — LACTATED RINGERS IV SOLN: INTRAVENOUS | Status: DC

## 2020-01-13 MED ORDER — ACETAMINOPHEN 500 MG PO TABS
1000.0000 mg | ORAL_TABLET | ORAL | Status: AC
  Administered 2020-01-13: 1000 mg via ORAL

## 2020-01-13 MED ORDER — KETOROLAC TROMETHAMINE 30 MG/ML IJ SOLN
30.0000 mg | Freq: Four times a day (QID) | INTRAMUSCULAR | Status: AC
  Administered 2020-01-13 – 2020-01-14 (×4): 30 mg via INTRAVENOUS
  Filled 2020-01-13 (×4): qty 1

## 2020-01-13 MED ORDER — HYDROMORPHONE HCL 1 MG/ML IJ SOLN: 0.2500 mg | INTRAMUSCULAR | Status: DC | PRN

## 2020-01-13 MED ORDER — SCOPOLAMINE 1 MG/3DAYS TD PT72
MEDICATED_PATCH | TRANSDERMAL | Status: AC
  Filled 2020-01-13: qty 1

## 2020-01-13 MED ORDER — MORPHINE SULFATE (PF) 0.5 MG/ML IJ SOLN
INTRAMUSCULAR | Status: AC
  Filled 2020-01-13: qty 10

## 2020-01-13 MED ORDER — BUPIVACAINE HCL (PF) 0.25 % IJ SOLN
INTRAMUSCULAR | Status: AC
  Filled 2020-01-13: qty 30

## 2020-01-13 MED ORDER — NALOXONE HCL 4 MG/10ML IJ SOLN
1.0000 ug/kg/h | INTRAVENOUS | Status: DC | PRN
  Filled 2020-01-13: qty 5

## 2020-01-13 MED ORDER — MORPHINE SULFATE (PF) 0.5 MG/ML IJ SOLN
INTRAMUSCULAR | Status: DC | PRN
  Administered 2020-01-13: 150 ug via INTRATHECAL

## 2020-01-13 MED ORDER — BUPIVACAINE IN DEXTROSE 0.75-8.25 % IT SOLN
INTRATHECAL | Status: DC | PRN
  Administered 2020-01-13: 1.4 mL via INTRATHECAL

## 2020-01-13 MED ORDER — MENTHOL 3 MG MT LOZG: 1.0000 | LOZENGE | OROMUCOSAL | Status: DC | PRN

## 2020-01-13 MED ORDER — DIBUCAINE (PERIANAL) 1 % EX OINT: 1.0000 "application " | TOPICAL_OINTMENT | CUTANEOUS | Status: DC | PRN

## 2020-01-13 MED ORDER — PHENYLEPHRINE HCL-NACL 20-0.9 MG/250ML-% IV SOLN
INTRAVENOUS | Status: DC | PRN
  Administered 2020-01-13: 60 ug/min via INTRAVENOUS

## 2020-01-13 MED ORDER — ONDANSETRON HCL 4 MG/2ML IJ SOLN
INTRAMUSCULAR | Status: AC
  Filled 2020-01-13: qty 2

## 2020-01-13 MED ORDER — DIPHENHYDRAMINE HCL 25 MG PO CAPS: 25.0000 mg | ORAL_CAPSULE | Freq: Four times a day (QID) | ORAL | Status: DC | PRN

## 2020-01-13 MED ORDER — DIPHENHYDRAMINE HCL 25 MG PO CAPS: 25.0000 mg | ORAL_CAPSULE | ORAL | Status: DC | PRN

## 2020-01-13 MED ORDER — CLINDAMYCIN PHOSPHATE 900 MG/50ML IV SOLN
900.0000 mg | INTRAVENOUS | Status: AC
  Administered 2020-01-13: 900 mg via INTRAVENOUS

## 2020-01-13 MED ORDER — MEPERIDINE HCL 25 MG/ML IJ SOLN: 6.2500 mg | INTRAMUSCULAR | Status: DC | PRN

## 2020-01-13 MED ORDER — FENTANYL CITRATE (PF) 100 MCG/2ML IJ SOLN
INTRAMUSCULAR | Status: AC
  Filled 2020-01-13: qty 2

## 2020-01-13 MED ORDER — CLINDAMYCIN PHOSPHATE 900 MG/50ML IV SOLN
INTRAVENOUS | Status: AC
  Filled 2020-01-13: qty 50

## 2020-01-13 MED ORDER — NALOXONE HCL 0.4 MG/ML IJ SOLN: 0.4000 mg | INTRAMUSCULAR | Status: DC | PRN

## 2020-01-13 MED ORDER — WITCH HAZEL-GLYCERIN EX PADS: 1.0000 "application " | MEDICATED_PAD | CUTANEOUS | Status: DC | PRN

## 2020-01-13 MED ORDER — ACETAMINOPHEN 500 MG PO TABS
ORAL_TABLET | ORAL | Status: AC
  Filled 2020-01-13: qty 2

## 2020-01-13 MED ORDER — KETOROLAC TROMETHAMINE 30 MG/ML IJ SOLN: 30.0000 mg | Freq: Four times a day (QID) | INTRAMUSCULAR | Status: AC | PRN

## 2020-01-13 MED ORDER — OXYCODONE HCL 5 MG PO TABS
5.0000 mg | ORAL_TABLET | ORAL | Status: DC | PRN
  Administered 2020-01-15: 5 mg via ORAL
  Filled 2020-01-13: qty 1

## 2020-01-13 MED ORDER — FENTANYL CITRATE (PF) 100 MCG/2ML IJ SOLN
INTRAMUSCULAR | Status: DC | PRN
  Administered 2020-01-13: 15 ug via INTRATHECAL

## 2020-01-13 MED ORDER — SENNOSIDES-DOCUSATE SODIUM 8.6-50 MG PO TABS
2.0000 | ORAL_TABLET | ORAL | Status: DC
  Administered 2020-01-14 – 2020-01-15 (×2): 2 via ORAL
  Filled 2020-01-13 (×2): qty 2

## 2020-01-13 MED ORDER — KETOROLAC TROMETHAMINE 30 MG/ML IJ SOLN
INTRAMUSCULAR | Status: AC
  Filled 2020-01-13: qty 1

## 2020-01-13 MED ORDER — MORPHINE SULFATE (PF) 0.5 MG/ML IJ SOLN: INTRAMUSCULAR | Status: DC | PRN

## 2020-01-13 MED ORDER — KETOROLAC TROMETHAMINE 30 MG/ML IJ SOLN
30.0000 mg | Freq: Four times a day (QID) | INTRAMUSCULAR | Status: AC | PRN
  Administered 2020-01-14: 30 mg via INTRAVENOUS

## 2020-01-13 MED ORDER — NALBUPHINE HCL 10 MG/ML IJ SOLN
5.0000 mg | INTRAMUSCULAR | Status: DC | PRN
  Administered 2020-01-13: 5 mg via INTRAVENOUS

## 2020-01-13 MED ORDER — TETANUS-DIPHTH-ACELL PERTUSSIS 5-2.5-18.5 LF-MCG/0.5 IM SUSY: 0.5000 mL | PREFILLED_SYRINGE | Freq: Once | INTRAMUSCULAR | Status: DC

## 2020-01-13 MED ORDER — OXYTOCIN-SODIUM CHLORIDE 30-0.9 UT/500ML-% IV SOLN
INTRAVENOUS | Status: DC | PRN
  Administered 2020-01-13: 30 [IU] via INTRAVENOUS

## 2020-01-13 MED ORDER — SODIUM CHLORIDE 0.9% FLUSH: 3.0000 mL | INTRAVENOUS | Status: DC | PRN

## 2020-01-13 MED ORDER — ONDANSETRON HCL 4 MG/2ML IJ SOLN
4.0000 mg | Freq: Three times a day (TID) | INTRAMUSCULAR | Status: DC | PRN
  Administered 2020-01-13: 4 mg via INTRAVENOUS
  Filled 2020-01-13: qty 2

## 2020-01-13 MED ORDER — NALBUPHINE HCL 10 MG/ML IJ SOLN: 5.0000 mg | Freq: Once | INTRAMUSCULAR | Status: DC | PRN

## 2020-01-13 MED ORDER — DIPHENHYDRAMINE HCL 50 MG/ML IJ SOLN: 12.5000 mg | INTRAMUSCULAR | Status: DC | PRN

## 2020-01-13 MED ORDER — ONDANSETRON HCL 4 MG/2ML IJ SOLN
INTRAMUSCULAR | Status: DC | PRN
  Administered 2020-01-13: 4 mg via INTRAVENOUS

## 2020-01-13 MED ORDER — KETOROLAC TROMETHAMINE 30 MG/ML IJ SOLN
30.0000 mg | Freq: Once | INTRAMUSCULAR | Status: AC | PRN
  Administered 2020-01-13: 30 mg via INTRAVENOUS

## 2020-01-13 MED ORDER — FERROUS SULFATE 325 (65 FE) MG PO TABS
325.0000 mg | ORAL_TABLET | Freq: Every day | ORAL | Status: DC
  Administered 2020-01-14 – 2020-01-15 (×2): 325 mg via ORAL
  Filled 2020-01-13 (×2): qty 1

## 2020-01-13 MED ORDER — ACETAMINOPHEN 500 MG PO TABS: 1000.0000 mg | ORAL_TABLET | Freq: Four times a day (QID) | ORAL | Status: DC

## 2020-01-13 MED ORDER — ACETAMINOPHEN 500 MG PO TABS
1000.0000 mg | ORAL_TABLET | Freq: Three times a day (TID) | ORAL | Status: DC
  Administered 2020-01-13 – 2020-01-15 (×6): 1000 mg via ORAL
  Filled 2020-01-13 (×6): qty 2

## 2020-01-13 MED ORDER — SOD CITRATE-CITRIC ACID 500-334 MG/5ML PO SOLN
ORAL | Status: AC
  Filled 2020-01-13: qty 30

## 2020-01-13 SURGICAL SUPPLY — 34 items
BENZOIN TINCTURE PRP APPL 2/3 (GAUZE/BANDAGES/DRESSINGS) ×2 IMPLANT
CLAMP CORD UMBIL (MISCELLANEOUS) ×2 IMPLANT
CLOTH BEACON ORANGE TIMEOUT ST (SAFETY) ×2 IMPLANT
DRSG OPSITE POSTOP 4X10 (GAUZE/BANDAGES/DRESSINGS) ×2 IMPLANT
ELECT REM PT RETURN 9FT ADLT (ELECTROSURGICAL) ×2
ELECTRODE REM PT RTRN 9FT ADLT (ELECTROSURGICAL) ×1 IMPLANT
EXTRACTOR VACUUM M CUP 4 TUBE (SUCTIONS) IMPLANT
GLOVE BIOGEL PI IND STRL 7.0 (GLOVE) ×3 IMPLANT
GLOVE BIOGEL PI INDICATOR 7.0 (GLOVE) ×3
GLOVE ECLIPSE 7.0 STRL STRAW (GLOVE) ×2 IMPLANT
GOWN STRL REUS W/TWL LRG LVL3 (GOWN DISPOSABLE) ×4 IMPLANT
KIT ABG SYR 3ML LUER SLIP (SYRINGE) ×2 IMPLANT
NEEDLE HYPO 22GX1.5 SAFETY (NEEDLE) ×2 IMPLANT
NEEDLE HYPO 25X5/8 SAFETYGLIDE (NEEDLE) ×2 IMPLANT
NS IRRIG 1000ML POUR BTL (IV SOLUTION) ×2 IMPLANT
PACK C SECTION WH (CUSTOM PROCEDURE TRAY) ×2 IMPLANT
PAD ABD 7.5X8 STRL (GAUZE/BANDAGES/DRESSINGS) ×2 IMPLANT
PAD OB MATERNITY 4.3X12.25 (PERSONAL CARE ITEMS) ×2 IMPLANT
PENCIL SMOKE EVAC W/HOLSTER (ELECTROSURGICAL) ×2 IMPLANT
RTRCTR C-SECT PINK 25CM LRG (MISCELLANEOUS) ×2 IMPLANT
SPONGE GAUZE 4X4 12PLY STER LF (GAUZE/BANDAGES/DRESSINGS) ×4 IMPLANT
STRIP CLOSURE SKIN 1/2X4 (GAUZE/BANDAGES/DRESSINGS) ×2 IMPLANT
SUT MNCRL 0 VIOLET CTX 36 (SUTURE) ×2 IMPLANT
SUT MON AB 3-0 SH 27 (SUTURE) ×2
SUT MON AB 3-0 SH27 (SUTURE) ×2 IMPLANT
SUT MONOCRYL 0 CTX 36 (SUTURE) ×2
SUT VIC AB 0 CTX 36 (SUTURE) ×1
SUT VIC AB 0 CTX36XBRD ANBCTRL (SUTURE) ×1 IMPLANT
SUT VIC AB 4-0 KS 27 (SUTURE) ×2 IMPLANT
SYR 30ML LL (SYRINGE) ×2 IMPLANT
TAPE CLOTH SURG 4X10 WHT LF (GAUZE/BANDAGES/DRESSINGS) ×2 IMPLANT
TOWEL OR 17X24 6PK STRL BLUE (TOWEL DISPOSABLE) ×2 IMPLANT
TRAY FOLEY W/BAG SLVR 14FR LF (SET/KITS/TRAYS/PACK) ×2 IMPLANT
WATER STERILE IRR 1000ML POUR (IV SOLUTION) ×2 IMPLANT

## 2020-01-13 NOTE — Anesthesia Procedure Notes (Signed)
Spinal  Patient location during procedure: OR Start time: 01/13/2020 3:04 PM End time: 01/13/2020 3:09 PM Staffing Performed: anesthesiologist  Anesthesiologist: Leilani Able, MD Preanesthetic Checklist Completed: patient identified, IV checked, site marked, risks and benefits discussed, surgical consent, monitors and equipment checked, pre-op evaluation and timeout performed Spinal Block Patient position: sitting Prep: DuraPrep and site prepped and draped Patient monitoring: continuous pulse ox and blood pressure Approach: midline Location: L3-4 Injection technique: single-shot Needle Needle type: Pencan  Needle gauge: 24 G Needle length: 10 cm Needle insertion depth: 6 cm Assessment Sensory level: T4

## 2020-01-13 NOTE — Discharge Summary (Signed)
Postpartum Discharge Summary  Date of Service updated 01/15/20     Patient Name: Cindy Pearson DOB: 1995/12/08 MRN: [id number]  Date of admission: 01/13/2020 Delivery date:01/13/2020  Delivering provider: Donnamae Jude  Date of discharge: 01/15/2020  Admitting diagnosis: Fetal growth retardation, antenatal [O36.5990] Cesarean delivery delivered [O82] Intrauterine pregnancy: [redacted]w[redacted]d    Secondary diagnosis:  Principal Problem:   S/P cesarean section Active Problems:   IUGR (intrauterine growth restriction) affecting care of mother   Supervision of other normal pregnancy, antepartum   Supervision of normal pregnancy, antepartum   Cesarean delivery delivered  Additional problems: none    Discharge diagnosis: Term Pregnancy Delivered                                              Post partum procedures:postpartum tubal ligation Augmentation: N/A Complications: None  Hospital course: Sceduled C/S   25y.o. yo [id number] 344w3das admitted to the hospital 01/13/2020 for scheduled cesarean section with the following indication:Elective Repeat.Delivery details are as follows:  Membrane Rupture Time/Date: 3:30 PM ,01/13/2020   Delivery Method:C-Section, Low Transverse  Details of operation can be found in separate operative note.  Patient had an uncomplicated postpartum course.  She is ambulating, tolerating a regular diet, passing flatus, and urinating well. Patient is discharged home in stable condition on  01/15/20        Newborn Data: Birth date:01/13/2020  Birth time:3:30 PM  Gender:Female  Living status:Living  Apgars:9 ,9  Weight:2360 g     Magnesium Sulfate received: No BMZ received: No Rhophylac:N/A MMR:N/A T-DaP:Given prenatally Flu: No Transfusion:No  Physical exam  Vitals:   01/14/20 0548 01/14/20 1400 01/14/20 2038 01/15/20 0524  BP: (!) 100/56 101/66 (!) 102/53 (!) 89/57  Pulse: (!) 56 69 69 64  Resp: 16 16 18 18   Temp: 97.8 F (36.6 C) 98.2 F (36.8 C) 98.1 F  (36.7 C) 98.5 F (36.9 C)  TempSrc: Oral Oral Oral Oral  SpO2: 100%  100%   Weight:      Height:       General: alert, cooperative and no distress Lochia: appropriate Uterine Fundus: firm Incision: Dressing is clean, dry, and intact DVT Evaluation: No evidence of DVT seen on physical exam. Labs: Lab Results  Component Value Date   WBC 7.3 01/14/2020   HGB 9.2 (L) 01/14/2020   HCT 28.7 (L) 01/14/2020   MCV 82.9 01/14/2020   PLT 124 (L) 01/14/2020   CMP Latest Ref Rng & Units 01/14/2020  Glucose 70 - 99 mg/dL -  BUN 6 - 20 mg/dL -  Creatinine 0.44 - 1.00 mg/dL 0.59  Sodium 135 - 145 mmol/L -  Potassium 3.5 - 5.1 mmol/L -  Chloride 98 - 111 mmol/L -  CO2 22 - 32 mmol/L -  Calcium 8.9 - 10.3 mg/dL -  Total Protein 6.5 - 8.1 g/dL -  Total Bilirubin 0.3 - 1.2 mg/dL -  Alkaline Phos 38 - 126 U/L -  AST 15 - 41 U/L -  ALT 0 - 44 U/L -   Edinburgh Score: Edinburgh Postnatal Depression Scale Screening Tool 01/14/2020  I have been able to laugh and see the funny side of things. 0  I have looked forward with enjoyment to things. 0  I have blamed myself unnecessarily when things went wrong. 0  I have been anxious  or worried for no good reason. 0  I have felt scared or panicky for no good reason. 0  Things have been getting on top of me. 0  I have been so unhappy that I have had difficulty sleeping. 0  I have felt sad or miserable. 0  I have been so unhappy that I have been crying. 0  The thought of harming myself has occurred to me. 0  Edinburgh Postnatal Depression Scale Total 0     After visit meds:  Allergies as of 01/15/2020      Reactions   Penicillins Hives   Patient states that she is allergic to all penicillins. Has patient had a PCN reaction causing immediate rash, facial/tongue/throat swelling, SOB or lightheadedness with hypotension: YES Has patient had a PCN reaction causing severe rash involving mucus membranes or skin necrosis: NO Has patient had a PCN reaction  that required hospitalization NO Has patient had a PCN reaction occurring within the last 10 years:NO If all of the above answers are "NO", then may proceed with Cephalosporin use.      Medication List    STOP taking these medications   aspirin 81 MG chewable tablet   omeprazole 20 MG capsule Commonly known as: PriLOSEC     TAKE these medications   acetaminophen 500 MG tablet Commonly known as: TYLENOL Take 2 tablets (1,000 mg total) by mouth every 8 (eight) hours.   Blood Pressure Monitor Kit 1 Device by Does not apply route once a week. To be monitored Regularly at home.   coconut oil Oil Apply 1 application topically as needed.   Colon Supp Med Misc Use for support in pregnancy.  Size to fit patient.   ferrous sulfate 325 (65 FE) MG tablet Take 1 tablet (325 mg total) by mouth daily.   ibuprofen 800 MG tablet Commonly known as: ADVIL Take 1 tablet (800 mg total) by mouth every 8 (eight) hours as needed.   Vitafol Gummies 3.33-0.333-34.8 MG Chew Chew 3 tablets by mouth daily before breakfast.        Discharge home in stable condition Infant Feeding: Breast Infant Disposition:home with mother Discharge instruction: per After Visit Summary and Postpartum booklet. Activity: Advance as tolerated. Pelvic rest for 6 weeks.  Diet: routine diet Future Appointments: Future Appointments  Date Time Provider [place]  01/20/2020 10:40 AM [place] None  02/28/2020  9:30 AM Constant, Peggy, MD [facility] None   Follow up Visit:   Please schedule this patient for a In person postpartum visit in 6 weeks with the following provider: MD. Additional Postpartum F/U:Colpo at 6 weeks and Incision check 1 week  Low risk pregnancy complicated by: repeat c section, FGR Delivery mode:  C-Section, Low Transverse  Anticipated Birth Control:  BTL done [facility]   05/10/2704 Arrie Senate, MD

## 2020-01-13 NOTE — H&P (Signed)
Obstetric Preoperative History and Physical  Cindy Pearson is a 25 y.o. [phone] with IUP at 50w3dpresenting for scheduled cesarean section.  Reports good fetal movement, no bleeding, no contractions, no leaking of fluid.  No acute preoperative concerns.    Cesarean Section Indication: history of 2 prior c sections, hx of FGR during this pregnancy   Prenatal Course Source of Care: [facility]    Pregnancy complications or risks: Patient Active Problem List   Diagnosis Date Noted  . [redacted] weeks gestation of pregnancy 12/08/2019  . History of pre-eclampsia 10/11/2019  . LGSIL on Pap smear of cervix 10/11/2019  . Supervision of normal pregnancy, antepartum 07/29/2019  . Postpartum severe preeclampsia 02/13/2018  . Supervision of other normal pregnancy, antepartum 09/03/2017  . Chlamydia 06/13/2016  . S/P cesarean section 05/26/2016  . IUGR (intrauterine growth restriction) affecting care of mother 05/17/2016   She plans to breastfeed She desires Depo-Provera for postpartum contraception.   Prenatal labs and studies: ABO, Rh: --/--/B POS (01/04 1110) Antibody: NEG (01/04 1110) Rubella: 2.16 (07/22 1545) RPR: NON REACTIVE (01/04 1135)  HBsAg: Negative (07/22 1545)  HIV: Non Reactive (11/10 1150)  GBS:  2 hr Glucola  normal Genetic screening normal Anatomy UKoreanormal  Prenatal Transfer Tool  Maternal Diabetes: No Genetic Screening: Normal Maternal Ultrasounds/Referrals: Normal Fetal Ultrasounds or other Referrals:  Severe FGR at 33wk, now EFW 6%ile  Maternal Substance Abuse:  No Significant Maternal Medications:  None Significant Maternal Lab Results: None  Past Medical History:  Diagnosis Date  . Asthma    Albuterol INH prn  . Blood transfusion without reported diagnosis   . Chlamydia 2012   hasn't picked up rx, not treated yet  . 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 History of postpartum hemorrhage   . Preeclampsia in postpartum period    Patient reported  . Preterm  labor 2011    Past Surgical History:  Procedure Laterality Date  . CESAREAN SECTION    . CESAREAN SECTION N/A 05/26/2016   Procedure: CESAREAN SECTION;  Surgeon: EFlorian Buff MD;  [place]  Service: Obstetrics;  Laterality: N/A;  . CESAREAN SECTION N/A 02/05/2018   Procedure: CESAREAN SECTION;  Surgeon: DSloan Leiter MD;  [place] [facility]  Service: Obstetrics;  Laterality: N/A;  . TONSILLECTOMY AND ADENOIDECTOMY      OB History  Gravida Para Term Preterm AB Living  _0 0 1 4  SAB IAB Ectopic Multiple Live Births  1 0 0 0 4    # Outcome Date GA Lbr Len/2nd Weight Sex Delivery Anes PTL Lv  6 Current           5 Term 02/05/18 [id number] g F CS-LTranv Spinal  LIV     Birth Comments: PP pre eclampsia  4 Term 05/26/16 3742w0d400 g M CS-LTranv Spinal  LIV     Birth Comments: PPH with transfusion  3 Term 03/12/11 42w94w3d15 g M VBAC EPI  LIV  2 Term 07/11/09 40w374w3d8 g F CS-LTranv EPI  LIV     Birth Comments: Failure to progress   1 SAB             Social History   Socioeconomic History  . Marital status: DomesSoil scientistpouse name: Not on file  . Number of children: 3  . Years of education: Not on file  . Highest education level: Not  on file  Occupational History  . Occupation: [employer]  Tobacco Use  . Smoking status: Former Smoker    Types: Cigars    Quit date: 05/19/2019    Years since quitting: 0.6  . Smokeless tobacco: Never Used  . Tobacco comment: started again 02/11/2018  Vaping Use  . Vaping Use: Former  Substance and Sexual Activity  . Alcohol use: Not Currently    Comment: occasional  . Drug use: Not Currently    Types: Marijuana    Comment: last smoked in May 2021  . Sexual activity: Not Currently    Partners: Female    Birth control/protection: None    Comment: 1 partner  Other Topics Concern  . Not on file  Social History Narrative  . Not on file   Social Determinants of Health   Financial  Resource Strain: Not on file  Food Insecurity: Not on file  Transportation Needs: Not on file  Physical Activity: Not on file  Stress: Not on file  Social Connections: Not on file    Family History  Problem Relation Age of Onset  . Diabetes Maternal Grandmother   . Heart disease Maternal Grandfather   . Cancer Maternal Grandfather   . Heart disease Paternal Grandfather   . Cancer Father     Medications Prior to Admission  Medication Sig Dispense Refill Last Dose  . aspirin 81 MG chewable tablet Chew 1 tablet (81 mg total) by mouth daily. 30 tablet 5 01/12/2020 at Unknown time  . omeprazole (PRILOSEC) 20 MG capsule Take 1 capsule (20 mg total) by mouth 2 (two) times daily before a meal. 60 capsule 5   . Prenatal Vit-Fe Phos-FA-Omega (VITAFOL GUMMIES) 3.33-0.333-34.8 MG CHEW Chew 3 tablets by mouth daily before breakfast. 90 tablet 11 01/12/2020 at Unknown time  . Blood Pressure Monitor KIT 1 Device by Does not apply route once a week. To be monitored Regularly at home. 1 kit 0   . Elastic Bandages & Supports (COMFORT FIT MATERNITY SUPP MED) MISC Use for support in pregnancy.  Size to fit patient. (Patient taking differently: Use for support in pregnancy.  Size to fit patient.) 1 each 0   . ferrous sulfate 325 (65 FE) MG tablet Take 1 tablet (325 mg total) by mouth daily. (Patient not taking: No sig reported) 30 tablet 0 Not Taking at Unknown time    Allergies  Allergen Reactions  . Penicillins Hives    Patient states that she is allergic to all penicillins. Has patient had a PCN reaction causing immediate rash, facial/tongue/throat swelling, SOB or lightheadedness with hypotension: YES Has patient had a PCN reaction causing severe rash involving mucus membranes or skin necrosis: NO Has patient had a PCN reaction that required hospitalization NO Has patient had a PCN reaction occurring within the last 10 years:NO If all of the above answers are "NO", then may proceed with Cephalosporin  use.    Review of Systems: Pertinent items noted in HPI and remainder of comprehensive ROS otherwise negative.  Physical Exam: BP 116/65   Pulse 100   Temp 98.4 F (36.9 C) (Oral)   Resp 16   Ht _0  (1.549 m)   Wt 79.4 kg   LMP 03/29/2019 (Approximate)   SpO2 98%   BMI 33.07 kg/m  FHR by Doppler: 140 bpm CONSTITUTIONAL: Well-developed, well-nourished female in no acute distress.  HENT:  Normocephalic, atraumatic NECK: Normal range of motion, supple, no masses SKIN: Skin is warm and dry. No rash  noted. Not diaphoretic. No erythema. No pallor. [place]: Alert and oriented to person, place, and time. Normal reflexes, muscle tone coordination. No cranial nerve deficit noted. PSYCHIATRIC: Normal mood and affect.   CARDIOVASCULAR: Normal heart rate noted RESPIRATORY: Effort normal  ABDOMEN: Soft, nontender, nondistended, gravid.  Well-healed Pfannenstiel incision.  PELVIC: Deferred    Pertinent Labs/Studies:   Results for orders placed or performed during the hospital encounter of 01/11/20 (from the past 72 hour(s))  Type and screen     Status: None   Collection Time: 01/11/20 11:10 AM  Result Value Ref Range   ABO/RH(D) B POS    Antibody Screen NEG    Sample Expiration      01/14/2020,2359 Performed at [facility], [place] [address] [place], [place] [address]   CBC     Status: Abnormal   Collection Time: 01/11/20 11:35 AM  Result Value Ref Range   WBC 5.0 4.0 - 10.5 K/uL   RBC 4.11 3.87 - 5.11 MIL/uL   Hemoglobin 11.5 (L) 12.0 - 15.0 g/dL   HCT 33.5 (L) 36.0 - 46.0 %   MCV 81.5 80.0 - 100.0 fL   MCH 28.0 26.0 - 34.0 pg   MCHC 34.3 30.0 - 36.0 g/dL   RDW 13.8 11.5 - 15.5 %   Platelets 141 (L) 150 - 400 K/uL   nRBC 0.0 0.0 - 0.2 %    Comment: Performed at [facility], [place] [address], [place], [place] [address]  RPR     Status: None   Collection Time: 01/11/20 11:35 AM  Result Value Ref Range   RPR Ser Ql NON REACTIVE NON REACTIVE    Comment:  Performed at [facility], [place] [address] [place], [place] [address]    Assessment and Plan: BRITTINY LEVITZ is a 25 y.o. [id number] at 75w3dbeing admitted for scheduled cesarean section. The risks of cesarean section discussed with the patient included but were not limited to: bleeding which may require transfusion or reoperation; infection which may require antibiotics; injury to bowel, bladder, ureters or other surrounding organs; injury to the fetus; need for additional procedures including hysterectomy in the event of a life-threatening hemorrhage; placental abnormalities with subsequent pregnancies, incisional problems, thromboembolic phenomenon and other postoperative/anesthesia complications. The patient concurred with the proposed plan, giving informed written consent for the procedure. Patient has been NPO since last night she will remain NPO for procedure. Anesthesia and OR aware. Preoperative prophylactic antibiotics and SCDs ordered on call to the OR. To OR when ready.   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

## 2020-01-13 NOTE — Op Note (Signed)
Operative Note   SURGERY DATE: 01/13/2020  PRE-OP DIAGNOSIS:  *Pregnancy at [redacted]w[redacted]d * Fetal Growth Restriction * History of 2 prior C Sections * Desires infertility   POST-OP DIAGNOSIS: Same   PROCEDURE: Repeat low transverse cesarean section with bilateral salpingectomy via pfannenstiel skin incision with double layer uterine closure  SURGEON: Surgeon(s) and Role:    Shawnie Pons, Shelbie Proctor, MD - Primary    * Rudi Bunyard, Arlana Pouch, MD - Fellow  ANESTHESIA: spinal  ESTIMATED BLOOD LOSS: 424 DRAINS: UOP via indwelling foley  TOTAL IV FLUIDS: 2000 mL crystalloid  VTE PROPHYLAXIS: SCDs to bilateral lower extremities  ANTIBIOTICS: Two grams of Cefazolin were given., within 1 hour of skin incision  SPECIMENS: placenta to path, fallopian tubes to path   COMPLICATIONS: none  INDICATIONS: as above, hx of 2 prior c sections, desires infertility   FINDINGS: No intra-abdominal adhesions were noted. Grossly normal uterus, tubes and ovaries. clear amniotic fluid, cephalic female infant, weight pending gm, APGARs 9/9, intact placenta.  PROCEDURE IN DETAIL: The patient was taken to the operating room where anesthesia was administered and normal fetal heart tones were confirmed. She was then prepped and draped in the normal fashion in the dorsal supine position with a leftward tilt.  After a time out was performed, a pfannensteil skin incision was made with the scalpel and carried through to the underlying layer of fascia. The fascia was then incised at the midline and this incision was extended laterally with scalpel and mayo scissors. Attention was turned to the superior aspect of the fascial incision which was grasped with the kocher clamps x 2, tented up and the rectus muscles were dissected off with the scalpel. The rectus muscles were then separated in the midline and the peritoneum was entered bluntly. The Alexis retractor was inserted.  A low transverse hysterotomy was made with the scalpel  until the endometrial cavity was breached and the amniotic sac ruptured with the Allis clamp, yielding clear amniotic fluid. This incision was extended bluntly and the infant's head, shoulders and body were delivered atraumatically.The cord was clamped x 2 and cut, and the infant was handed to the awaiting pediatricians, after delayed cord clamping was done.  The placenta was then gradually expressed from the uterus and then the uterus was exteriorized and cleared of all clots and debris. The hysterotomy was repaired with a running suture of 0 monocryl.    The left fallopian tube was then identified, and the Babcock clamp was then used to grasp the tube approximately 2 cm from the cornual region. A 2 cm segment of the tube was then doubly ligated with monocryl suture, transected and excised. Good hemostasis was noted. The right fallopian tube was then identified, doubly ligated, and a 2 cm segment excised in a similar fashion allowing for bilateral tubal sterilization.  Good hemostasis was noted overall.   The uterus and adnexa were then returned to the abdomen, and the hysterotomy and all operative sites were reinspected and excellent hemostasis was noted. An area of bleeding omentum was ligated with monocryl suture with good resulting hemostasis.   The peritoneum was closed with a running stitch of 0 Monocryl. The fascia was reapproximated with 0 Vicryl in a simple running fashion bilaterally. The subcutaneous layer was then reapproximated with running sutures of 2-0 plain gut, and the skin was then closed with 4-0 vicryl on a Keith's needle, in a subcuticular fashion.  The patient  tolerated the procedure well. Sponge, lap, needle, and instrument  counts were correct x 2. The patient was transferred to the recovery room awake, alert and breathing independently in stable condition.   Casper Harrison, MD [facility] Fellow, [facility] for [facility], [facility]

## 2020-01-13 NOTE — Anesthesia Preprocedure Evaluation (Signed)
Anesthesia Evaluation  Patient identified by MRN, date of birth, ID band Patient awake    Reviewed: Allergy & Precautions, H&P , NPO status , Patient's Chart, lab work & pertinent test results  Airway Mallampati: II  TM Distance: >3 FB Neck ROM: full    Dental no notable dental hx.    Pulmonary former smoker,    Pulmonary exam normal breath sounds clear to auscultation       Cardiovascular hypertension, negative cardio ROS Normal cardiovascular exam Rhythm:regular Rate:Normal     Neuro/Psych negative neurological ROS  negative psych ROS   GI/Hepatic negative GI ROS, Neg liver ROS, (+)       marijuana use,   Endo/Other  negative endocrine ROSObesity   Renal/GU negative Renal ROS  negative genitourinary   Musculoskeletal negative musculoskeletal ROS (+)   Abdominal (+) + obese,   Peds  Hematology negative hematology ROS (+) Plt 146k   Anesthesia Other Findings   Reproductive/Obstetrics (+) Pregnancy Previous C-section x2                             Anesthesia Physical  Anesthesia Plan  ASA: II  Anesthesia Plan: Spinal   Post-op Pain Management:    Induction:   PONV Risk Score and Plan: 2 and 3 and Treatment may vary due to age or medical condition, Scopolamine patch - Pre-op, Ondansetron and Dexamethasone  Airway Management Planned: Natural Airway and Nasal Cannula  Additional Equipment: None  Intra-op Plan:   Post-operative Plan:   Informed Consent: I have reviewed the patients History and Physical, chart, labs and discussed the procedure including the risks, benefits and alternatives for the proposed anesthesia with the patient or authorized representative who has indicated his/her understanding and acceptance.       Plan Discussed with: CRNA  Anesthesia Plan Comments:         Anesthesia Quick Evaluation

## 2020-01-13 NOTE — Anesthesia Postprocedure Evaluation (Signed)
Anesthesia Post Note  Patient: JORDAIN RADIN  Procedure(s) Performed: CESAREAN SECTION (N/A )     Patient location during evaluation: PACU Anesthesia Type: Spinal Level of consciousness: awake Pain management: pain level controlled Vital Signs Assessment: post-procedure vital signs reviewed and stable Respiratory status: spontaneous breathing Cardiovascular status: stable Postop Assessment: no headache, no backache, spinal receding, patient able to bend at knees and no apparent nausea or vomiting Anesthetic complications: no   No complications documented.  Last Vitals:  Vitals:   01/13/20 1715 01/13/20 1736  BP: 99/72 95/63  Pulse: 61 (!) 56  Resp: 16 18  Temp: (!) 36.2 C (!) 36.1 C  SpO2: 100% 100%    Last Pain:  Vitals:   01/13/20 1736  TempSrc: Oral  PainSc: 2    Pain Goal:    LLE Motor Response: Purposeful movement (01/13/20 1715) LLE Sensation: Tingling (01/13/20 1715) RLE Motor Response: Purposeful movement (01/13/20 1715) RLE Sensation: Tingling (01/13/20 1715)     Epidural/Spinal Function Cutaneous sensation: Able to Wiggle Toes (01/13/20 1715), Patient able to flex knees: Yes (01/13/20 1715), Patient able to lift hips off bed: No (01/13/20 1715), Back pain beyond tenderness at insertion site: No (01/13/20 1715), Progressively worsening motor and/or sensory loss: No (01/13/20 1715), Bowel and/or bladder incontinence post epidural: No (01/13/20 1715)  Caren Macadam

## 2020-01-13 NOTE — Transfer of Care (Signed)
Immediate Anesthesia Transfer of Care Note  Patient: Cindy Pearson  Procedure(s) Performed: CESAREAN SECTION (N/A )  Patient Location: PACU  Anesthesia Type:Spinal  Level of Consciousness: awake, alert  and oriented  Airway & Oxygen Therapy: Patient Spontanous Breathing  Post-op Assessment: Report given to RN and Post -op Vital signs reviewed and stable  Post vital signs: Reviewed and stable  Last Vitals:  Vitals Value Taken Time  BP 100/63 01/13/20 1630  Temp    Pulse 65 01/13/20 1641  Resp 22 01/13/20 1641  SpO2 99 % 01/13/20 1641  Vitals shown include unvalidated device data.  Last Pain:  Vitals:   01/13/20 1630  TempSrc:   PainSc: 0-No pain         Complications: No complications documented.

## 2020-01-14 ENCOUNTER — Encounter (HOSPITAL_COMMUNITY): Payer: Self-pay | Admitting: Family Medicine

## 2020-01-14 LAB — CBC
HCT: 28.7 % — ABNORMAL LOW (ref 36.0–46.0)
Hemoglobin: 9.2 g/dL — ABNORMAL LOW (ref 12.0–15.0)
MCH: 26.6 pg (ref 26.0–34.0)
MCHC: 32.1 g/dL (ref 30.0–36.0)
MCV: 82.9 fL (ref 80.0–100.0)
Platelets: 124 10*3/uL — ABNORMAL LOW (ref 150–400)
RBC: 3.46 MIL/uL — ABNORMAL LOW (ref 3.87–5.11)
RDW: 14 % (ref 11.5–15.5)
WBC: 7.3 10*3/uL (ref 4.0–10.5)
nRBC: 0 % (ref 0.0–0.2)

## 2020-01-14 LAB — CREATININE, SERUM
Creatinine, Ser: 0.59 mg/dL (ref 0.44–1.00)
GFR, Estimated: >60 mL/min (ref >60)

## 2020-01-14 NOTE — Lactation Note (Signed)
This note was copied from a baby's chart. Lactation Consultation Note Asked RN to ask mom if she is sure she doesn't want to see LC d/t baby 5 lbs. Gave RN LPI information sheet on supplementing and encouraged RN to encouraged mom to pump and give baby EBM as supplement.  Patient Name: Cindy Pearson Date: 01/14/2020   Age:26 hours  Maternal Data    Feeding Feeding Type: Breast Fed  LATCH Score                   Interventions    Lactation Tools Discussed/Used     Consult Status      Charyl Dancer 01/14/2020, 10:19 PM

## 2020-01-14 NOTE — Lactation Note (Incomplete)
This note was copied from a baby's chart. Lactation Consultation Note  Patient Name: Cindy Pearson [id number] Date: 01/14/2020 Reason for consult: Initial assessment;Early term 37-38.6wks;Infant < 6lbs Age:26 hours  Maternal Data Has patient been taught Hand Expression?: Yes Does the patient have breastfeeding experience prior to this delivery?: Yes  Feeding Feeding Type: Breast Fed  LATCH Score Latch: Grasps breast easily, tongue down, lips flanged, rhythmical sucking.  Audible Swallowing: A few with stimulation  Type of Nipple: Everted at rest and after stimulation  Comfort (Breast/Nipple): Soft / non-tender  Hold (Positioning): No assistance needed to correctly position infant at breast.  LATCH Score: 9  Interventions Interventions: Breast feeding basics reviewed;DEBP (personal)  Lactation Tools Discussed/Used     Consult Status Consult Status: Follow-up Date: 01/14/20 Follow-up type: In-patient    Dahlia Byes [facility] 01/14/2020, 11:18 AM

## 2020-01-14 NOTE — Progress Notes (Signed)
Post-Op Day 1, repeat CS   Subjective: No complaints, up ad lib, voiding and tolerating PO, passing flatus,small lochia,.plans to breastfeed, bilateral tubal ligation  Objective: Blood pressure (!) 100/56, pulse (!) 56, temperature 97.8 F (36.6 C), temperature source Oral, resp. rate 16, height 5\' 1"  (1.549 m), weight 79.4 kg, last menstrual period 03/29/2019, SpO2 100 %, unknown if currently breastfeeding.  Physical Exam:  General: alert, cooperative and no distress Lochia:normal flow Chest: CTAB Heart: RRR no m/r/g Abdomen: +BS, soft, nontender, dsg c/d/intact, no erythema Uterine Fundus: firm DVT Evaluation: No evidence of DVT seen on physical exam. Extremities: trace edema  Recent Labs    01/11/20 1135 01/14/20 0523  HGB 11.5* 9.2*  HCT 33.5* 28.7*    Assessment/Plan: Breastfeeding and Lactation consult   LOS: 1 day   03/13/20 01/14/2020, 7:22 AM

## 2020-01-14 NOTE — Lactation Note (Signed)
This note was copied from a baby's chart. Lactation Consultation Note LC asked mom if she needed any assistance w/pumping or latching. Mom stated no she was doing OK. Mom didn't appear to want LC's assistance.  Mom told RN she was going to supplement but right now the baby was doing good BF. RN encouraged supplementing d/t LBW.  Patient Name: Cindy Pearson [id number] Date: 01/14/2020   Age:26 hours  Maternal Data    Feeding Feeding Type: Breast Fed  LATCH Score                   Interventions    Lactation Tools Discussed/Used     Consult Status      Charyl Dancer 01/14/2020, 10:44 PM
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
## 2020-01-15 MED ORDER — COCONUT OIL OIL: 1.0000 "application " | TOPICAL_OIL | 0 refills | Status: AC | PRN

## 2020-01-15 MED ORDER — IBUPROFEN 800 MG PO TABS: 800.0000 mg | ORAL_TABLET | Freq: Three times a day (TID) | ORAL | 0 refills | Status: DC | PRN

## 2020-01-15 MED ORDER — ACETAMINOPHEN 500 MG PO TABS: 1000.0000 mg | ORAL_TABLET | Freq: Three times a day (TID) | ORAL | 0 refills | Status: AC

## 2020-01-15 NOTE — Lactation Note (Signed)
This note was copied from a baby's chart. Lactation Consultation Note  Patient Name: Girl Iylah Dworkin KYHCW'[id number] Date: 01/15/2020 Reason for consult: Follow-up assessment Age:26 hours   LC Follow Up Visit:  Per RN, mother declined a lactation consult.   Maternal Data    Feeding    LATCH Score                   Interventions    Lactation Tools Discussed/Used     Consult Status Consult Status: Complete Date: 01/15/20 Follow-up type: Call as needed    Rahm Minix R Latrice Storlie 01/15/2020, 8:45 AM

## 2020-01-17 LAB — SURGICAL PATHOLOGY

## 2020-01-20 ENCOUNTER — Inpatient Hospital Stay (HOSPITAL_COMMUNITY)
Admission: AD | Admit: 2020-01-20 | Discharge: 2020-01-20 | Disposition: A | Discharge status: Home / Self Care | Payer: Medicaid Other | Attending: Obstetrics and Gynecology | Admitting: Obstetrics and Gynecology

## 2020-01-20 ENCOUNTER — Encounter (HOSPITAL_COMMUNITY): Payer: Self-pay | Admitting: Obstetrics and Gynecology

## 2020-01-20 ENCOUNTER — Other Ambulatory Visit: Payer: Self-pay

## 2020-01-20 ENCOUNTER — Inpatient Hospital Stay (HOSPITAL_COMMUNITY): Payer: Medicaid Other

## 2020-01-20 ENCOUNTER — Ambulatory Visit: Payer: Medicaid Other

## 2020-01-20 DIAGNOSIS — R079 Chest pain, unspecified: Secondary | ICD-10-CM

## 2020-01-20 DIAGNOSIS — F17290 Nicotine dependence, other tobacco product, uncomplicated: Secondary | ICD-10-CM | POA: Insufficient documentation

## 2020-01-20 DIAGNOSIS — R519 Headache, unspecified: Secondary | ICD-10-CM

## 2020-01-20 DIAGNOSIS — O99893 Other specified diseases and conditions complicating puerperium: Secondary | ICD-10-CM

## 2020-01-20 DIAGNOSIS — Z880 Allergy status to penicillin: Secondary | ICD-10-CM | POA: Insufficient documentation

## 2020-01-20 DIAGNOSIS — O99335 Smoking (tobacco) complicating the puerperium: Secondary | ICD-10-CM | POA: Insufficient documentation

## 2020-01-20 DIAGNOSIS — O165 Unspecified maternal hypertension, complicating the puerperium: Secondary | ICD-10-CM | POA: Diagnosis not present

## 2020-01-20 LAB — COMPREHENSIVE METABOLIC PANEL
ALT: 25 U/L (ref 0–44)
AST: 17 U/L (ref 15–41)
Albumin: 2.9 g/dL — ABNORMAL LOW (ref 3.5–5.0)
Alkaline Phosphatase: 78 U/L (ref 38–126)
Anion gap: 9 (ref 5–15)
BUN: 7 mg/dL (ref 6–20)
CO2: 25 mmol/L (ref 22–32)
Calcium: 8.1 mg/dL — ABNORMAL LOW (ref 8.9–10.3)
Chloride: 108 mmol/L (ref 98–111)
Creatinine, Ser: 0.68 mg/dL (ref 0.44–1.00)
GFR, Estimated: >60 mL/min (ref >60)
Glucose, Bld: 78 mg/dL (ref 70–99)
Potassium: 3.4 mmol/L — ABNORMAL LOW (ref 3.5–5.1)
Sodium: 142 mmol/L (ref 135–145)
Total Bilirubin: 0.6 mg/dL (ref 0.3–1.2)
Total Protein: 5.8 g/dL — ABNORMAL LOW (ref 6.5–8.1)

## 2020-01-20 LAB — CBC
HCT: 31.8 % — ABNORMAL LOW (ref 36.0–46.0)
Hemoglobin: 10 g/dL — ABNORMAL LOW (ref 12.0–15.0)
MCH: 27 pg (ref 26.0–34.0)
MCHC: 31.4 g/dL (ref 30.0–36.0)
MCV: 85.7 fL (ref 80.0–100.0)
Platelets: 186 10*3/uL (ref 150–400)
RBC: 3.71 MIL/uL — ABNORMAL LOW (ref 3.87–5.11)
RDW: 15.4 % (ref 11.5–15.5)
WBC: 6.5 10*3/uL (ref 4.0–10.5)
nRBC: 0 % (ref 0.0–0.2)

## 2020-01-20 LAB — PROTEIN / CREATININE RATIO, URINE
Creatinine, Urine: 71.92 mg/dL
Protein Creatinine Ratio: 0.17 mg/mg{Cre} — ABNORMAL HIGH (ref 0.00–0.15)
Total Protein, Urine: 12 mg/dL

## 2020-01-20 LAB — TROPONIN I (HIGH SENSITIVITY): Troponin I (High Sensitivity): 4 ng/L (ref <18)

## 2020-01-20 MED ORDER — NIFEDIPINE ER OSMOTIC RELEASE 30 MG PO TB24
30.0000 mg | ORAL_TABLET | Freq: Once | ORAL | Status: AC
  Administered 2020-01-20: 30 mg via ORAL
  Filled 2020-01-20: qty 1

## 2020-01-20 MED ORDER — LABETALOL HCL 5 MG/ML IV SOLN: 40.0000 mg | INTRAVENOUS | Status: DC | PRN

## 2020-01-20 MED ORDER — LIDOCAINE VISCOUS HCL 2 % MT SOLN
15.0000 mL | Freq: Once | OROMUCOSAL | Status: AC
  Administered 2020-01-20: 15 mL via ORAL
  Filled 2020-01-20: qty 15

## 2020-01-20 MED ORDER — LABETALOL HCL 5 MG/ML IV SOLN: 20.0000 mg | INTRAVENOUS | Status: DC | PRN

## 2020-01-20 MED ORDER — CYCLOBENZAPRINE HCL 5 MG PO TABS
10.0000 mg | ORAL_TABLET | Freq: Once | ORAL | Status: AC
  Administered 2020-01-20: 10 mg via ORAL
  Filled 2020-01-20: qty 2

## 2020-01-20 MED ORDER — LABETALOL HCL 5 MG/ML IV SOLN: 80.0000 mg | INTRAVENOUS | Status: DC | PRN

## 2020-01-20 MED ORDER — AMLODIPINE BESYLATE 10 MG PO TABS
10.0000 mg | ORAL_TABLET | Freq: Once | ORAL | Status: DC
  Filled 2020-01-20: qty 1

## 2020-01-20 MED ORDER — ALUM & MAG HYDROXIDE-SIMETH 200-200-20 MG/5ML PO SUSP
30.0000 mL | Freq: Once | ORAL | Status: AC
  Administered 2020-01-20: 30 mL via ORAL
  Filled 2020-01-20: qty 30

## 2020-01-20 MED ORDER — CYCLOBENZAPRINE HCL 10 MG PO TABS: 10.0000 mg | ORAL_TABLET | Freq: Two times a day (BID) | ORAL | 0 refills | Status: DC | PRN

## 2020-01-20 MED ORDER — NIFEDIPINE ER OSMOTIC RELEASE 30 MG PO TB24: 30.0000 mg | ORAL_TABLET | Freq: Every day | ORAL | 1 refills | Status: AC

## 2020-01-20 MED ORDER — ACETAMINOPHEN 500 MG PO TABS
1000.0000 mg | ORAL_TABLET | Freq: Once | ORAL | Status: AC
  Administered 2020-01-20: 1000 mg via ORAL
  Filled 2020-01-20: qty 2

## 2020-01-20 MED ORDER — HYDRALAZINE HCL 20 MG/ML IJ SOLN: 10.0000 mg | INTRAMUSCULAR | Status: DC | PRN

## 2020-01-20 NOTE — MAU Provider Note (Addendum)
History     CSN: [id number]  Arrival date and time: 01/20/20 0347   Event Date/Time   First Provider Initiated Contact with Patient 01/20/20 [phone]      Chief Complaint  Patient presents with  . Hypertension   Cindy Pearson is a 25 y.o. [id number] at 7 Days Postpartum who receives care at [facility].  She presents today for Hypertension.  She states she had a headache and chest pain that started yesterday.  She states the chest pain is a tightness in the middle of her chest. She rates the pain a 8/10.  She reports the headache is mild and has improved since yesterday without interventions.  Patient reports she had postpartum preeclampsia in her pregnancy last year.  She denies visual disturbances, SOB, and edema.  Patient expresses concern stating that her symptoms are similar to those experienced last year prior to her PreE diagnosis.   OB History    Gravida  6   Para  5   Term  5   Preterm  0   AB  1   Living  5     SAB  1   IAB  0   Ectopic  0   Multiple  0   Live Births  5           Past Medical History:  Diagnosis Date  . Asthma    Albuterol INH prn  . Blood transfusion without reported diagnosis   . Chlamydia 2012   hasn't picked up rx, not treated yet  . 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 History of postpartum hemorrhage   . Preeclampsia in postpartum period    Patient reported  . Preterm labor 2011    Past Surgical History:  Procedure Laterality Date  . CESAREAN SECTION    . CESAREAN SECTION N/A 05/26/2016   Procedure: CESAREAN SECTION;  Surgeon: Florian Buff, MD;  Location: [place];  Service: Obstetrics;  Laterality: N/A;  . CESAREAN SECTION N/A 02/05/2018   Procedure: CESAREAN SECTION;  Surgeon: Sloan Leiter, MD;  Location: [place];  Service: Obstetrics;  Laterality: N/A;  . CESAREAN SECTION N/A 01/13/2020   Procedure: CESAREAN SECTION;  Surgeon: Donnamae Jude, MD;  Location: [facility];  Service: Obstetrics;   Laterality: N/A;  . TONSILLECTOMY AND ADENOIDECTOMY      Family History  Problem Relation Age of Onset  . Diabetes Maternal Grandmother   . Heart disease Maternal Grandfather   . Cancer Maternal Grandfather   . Heart disease Paternal Grandfather   . Cancer Father     Social History   Tobacco Use  . Smoking status: Current Some Day Smoker    Types: Cigars    Last attempt to quit: 05/19/2019    Years since quitting: 0.6  . Smokeless tobacco: Never Used  . Tobacco comment: started again 02/11/2018  Vaping Use  . Vaping Use: Former  Substance Use Topics  . Alcohol use: Not Currently    Comment: occasional  . Drug use: Not Currently    Types: Marijuana    Comment: last smoked in January 2022    Allergies:  Allergies  Allergen Reactions  . Penicillins Hives    Patient states that she is allergic to all penicillins. Has patient had a PCN reaction causing immediate rash, facial/tongue/throat swelling, SOB or lightheadedness with hypotension: YES Has patient had a PCN reaction causing severe rash involving mucus membranes or skin necrosis: NO Has  patient had a PCN reaction that required hospitalization NO Has patient had a PCN reaction occurring within the last 10 years:NO If all of the above answers are "NO", then may proceed with Cephalosporin use.    Medications Prior to Admission  Medication Sig Dispense Refill Last Dose  . acetaminophen (TYLENOL) 500 MG tablet Take 2 tablets (1,000 mg total) by mouth every 8 (eight) hours. 30 tablet 0 01/19/2020 at Unknown time  . Blood Pressure Monitor KIT 1 Device by Does not apply route once a week. To be monitored Regularly at home. 1 kit 0   . coconut oil OIL Apply 1 application topically as needed.  0   . Elastic Bandages & Supports (COMFORT FIT MATERNITY SUPP MED) MISC Use for support in pregnancy.  Size to fit patient. (Patient taking differently: Use for support in pregnancy.  Size to fit patient.) 1 each 0   . ferrous sulfate 325  (65 FE) MG tablet Take 1 tablet (325 mg total) by mouth daily. (Patient not taking: No sig reported) 30 tablet 0   . ibuprofen (ADVIL) 800 MG tablet Take 1 tablet (800 mg total) by mouth every 8 (eight) hours as needed. 30 tablet 0   . Prenatal Vit-Fe Phos-FA-Omega (VITAFOL GUMMIES) 3.33-0.333-34.8 MG CHEW Chew 3 tablets by mouth daily before breakfast. 90 tablet 11 01/18/2020    Review of Systems  Constitutional: Negative for chills and fever.  Respiratory: Positive for chest tightness. Negative for cough and shortness of breath.   Cardiovascular: Positive for chest pain.  Gastrointestinal: Negative for abdominal pain, nausea and vomiting.  Genitourinary: Positive for vaginal bleeding. Negative for difficulty urinating, dysuria and vaginal discharge.  Neurological: Positive for headaches (Mild). Negative for dizziness and light-headedness.   Physical Exam   Blood pressure (!) 160/99, pulse 69, temperature 98.6 F (37 C), temperature source Oral, resp. rate 18, height 5' 1"  (1.549 m), weight 79.2 kg, SpO2 98 %, unknown if currently breastfeeding. Vitals:   01/20/20 1001 01/20/20 1016 01/20/20 1031 01/20/20 1046  BP: (!) 157/102 (!) 157/98 (!) 157/99 (!) 149/98  Pulse: 68 67 64 66  Resp:      Temp:      TempSrc:      SpO2:      Weight:      Height:        Physical Exam Constitutional:      Appearance: Normal appearance.  HENT:     Head: Normocephalic and atraumatic.  Eyes:     Conjunctiva/sclera: Conjunctivae normal.  Cardiovascular:     Rate and Rhythm: Normal rate.  Pulmonary:     Effort: Pulmonary effort is normal. No respiratory distress.     Breath sounds: Normal breath sounds.  Abdominal:     General: There is no distension.     Palpations: Abdomen is soft.     Tenderness: There is no abdominal tenderness.     Comments: Honeycomb dressing removed Steri strips removed Incision intact and without drainage, erythema, or abnormal tenderness.   Steri strips replaced.   Musculoskeletal:     Cervical back: Normal range of motion.  Skin:    General: Skin is warm and dry.  Neurological:     Mental Status: She is alert and oriented to person, place, and time.  Psychiatric:        Mood and Affect: Mood normal.        Behavior: Behavior normal.        Thought Content: Thought content normal.  MAU Course  Procedures Results for orders placed or performed during the hospital encounter of 01/20/20 (from the past 24 hour(s))  CBC     Status: Abnormal   Collection Time: 01/20/20  9:21 AM  Result Value Ref Range   WBC 6.5 4.0 - 10.5 K/uL   RBC 3.71 (L) 3.87 - 5.11 MIL/uL   Hemoglobin 10.0 (L) 12.0 - 15.0 g/dL   HCT 31.8 (L) 36.0 - 46.0 %   MCV 85.7 80.0 - 100.0 fL   MCH 27.0 26.0 - 34.0 pg   MCHC 31.4 30.0 - 36.0 g/dL   RDW 15.4 11.5 - 15.5 %   Platelets 186 150 - 400 K/uL   nRBC 0.0 0.0 - 0.2 %  Comprehensive metabolic panel     Status: Abnormal   Collection Time: 01/20/20  9:21 AM  Result Value Ref Range   Sodium 142 135 - 145 mmol/L   Potassium 3.4 (L) 3.5 - 5.1 mmol/L   Chloride 108 98 - 111 mmol/L   CO2 25 22 - 32 mmol/L   Glucose, Bld 78 70 - 99 mg/dL   BUN 7 6 - 20 mg/dL   Creatinine, Ser 0.68 0.44 - 1.00 mg/dL   Calcium 8.1 (L) 8.9 - 10.3 mg/dL   Total Protein 5.8 (L) 6.5 - 8.1 g/dL   Albumin 2.9 (L) 3.5 - 5.0 g/dL   AST 17 15 - 41 U/L   ALT 25 0 - 44 U/L   Alkaline Phosphatase 78 38 - 126 U/L   Total Bilirubin 0.6 0.3 - 1.2 mg/dL   GFR, Estimated >60 >60 mL/min   Anion gap 9 5 - 15  Protein / creatinine ratio, urine     Status: Abnormal   Collection Time: 01/20/20  9:21 AM  Result Value Ref Range   Creatinine, Urine 71.92 mg/dL   Total Protein, Urine 12 mg/dL   Protein Creatinine Ratio 0.17 (H) 0.00 - 0.15 mg/mg[Cre]  Troponin I (High Sensitivity)     Status: None   Collection Time: 01/20/20  9:21 AM  Result Value Ref Range   Troponin I (High Sensitivity) 4 <18 ng/L   DG Chest 1 View  Result Date: 01/20/2020 CLINICAL  DATA:  Patient post partum 01/13/2020.  Chest tightness. EXAM: CHEST  1 VIEW COMPARISON:  PA and lateral chest 08/02/2009. FINDINGS: The lungs are clear. Heart size is normal. No pneumothorax or pleural fluid. No bony abnormality. IMPRESSION: Normal chest. Electronically Signed   By: Inge Rise M.D.   On: 01/20/2020 13:03    MDM Physical Exam Labs: CBC, CMP, PC Ratio, Troponin Measure BPQ15 min Pain Management Consult EKG Assessment and Plan  25 year old, [id number]  7 Days Postpartum Headache Chest Pain  H/O PP PreEclampsia  -Reviewed POC with patient. -Exam performed and findings discussed.  -Will start IV and collect PreE labs -Will give Procardia 30xL now -Will monitor and reassess.   Maryann Conners 01/20/2020, 8:56 AM   Reassessment (10:56 AM)  -Patient reports chest pain remains, but improved with movement. -Informed that would perform EKG  -Dr. Lamount Cohen updated on patient status, results, and current complaints. Advised:  *Treat chest pain with Flexeril or tylenol as appropriate. *GI Cocktail *Agrees with EKG. -Flexeril and tylenol ordered.  Nurse instructed to assess patient reaction prior to giving flexeril as she has infant at bedside.  If no history of usage, only give tylenol.  Reassessment (11:51 AM) -EKG returns and reviewed by Dr. Dione Plover. Advised: *Collect Troponin -Nurse instructed  to call lab and add on lab as ordered.   Reassessment (12:43 PM) Vitals:   01/20/20 1231 01/20/20 1246  BP: 132/85 129/84  Pulse: 65 66  Resp:  20  Temp:  98.1 F (36.7 C)  SpO2:  97%    -Results return normal. -Chest X Ray ordered by  Dr. Johnette Abraham. Changed to 1V by provider due to need for bedside completion.  -Patient informed of POC and Chest XRay.  Discussed how infant would have to be temporarily removed from room or placed in bathroom during completion. -Patient reports improvement in chest pain from 10/10 to 5/10.   Reassessment (2:01 PM)  -CXR returns  without significant findings. -Provider to bedside and patient reports chest pain has improved and minor back pain that she rates a 2/10.  -Patient requests flexeril for home usage. -Discussed usage of procardia daily for blood pressure mgmt and need for follow up appt in one week for bp check. -Patient reports appt was missed, today, for incision check. Wound assessed as above. -Rx for Procardia and Flexeril sent to pharmacy on file.  -Message sent to [facility] for scheduling of BP check. Okay for virtual as patient with cuff at home.  -Encouraged to call or return to MAU if symptoms worsen or with the onset of new symptoms. -Discharged to home in stable condition.  Maryann Conners MSN, CNM Advanced Practice Provider, Center for [employer] [facility]

## 2020-01-20 NOTE — MAU Note (Signed)
PP c-section 01/13/2020. Stated she took b/p this morning and it was 147/99. Had preeclampsia after her last delivery as well. C/o chest pain with some SOB and mild headache this morning a s well/

## 2020-01-20 NOTE — Discharge Instructions (Signed)
How to Take Your Blood Pressure Blood pressure is a measurement of how strongly your blood is pressing against the walls of your arteries. Arteries are blood vessels that carry blood from your heart throughout your body. Your health care provider takes your blood pressure at each office visit. You can also take your own blood pressure at home with a blood pressure monitor. You may need to take your own blood pressure to:  Confirm a diagnosis of high blood pressure (hypertension).  Monitor your blood pressure over time.  Make sure your blood pressure medicine is working. Supplies needed:  Blood pressure monitor.  Dining room chair to sit in.  Table or desk.  Small notebook and pencil or pen. How to prepare To get the most accurate reading, avoid the following for 30 minutes before you check your blood pressure:  Drinking caffeine.  Drinking alcohol.  Eating.  Smoking.  Exercising. Five minutes before you check your blood pressure:  Use the bathroom and urinate so that you have an empty bladder.  Sit quietly in a dining room chair. Do not sit in a soft couch or an armchair. Do not talk. How to take your blood pressure To check your blood pressure, follow the instructions in the manual that came with your blood pressure monitor. If you have a digital blood pressure monitor, the instructions may be as follows: 1. Sit up straight in a chair. 2. Place your feet on the floor. Do not cross your ankles or legs. 3. Rest your left arm at the level of your heart on a table or desk or on the arm of a chair. 4. Pull up your shirt sleeve. 5. Wrap the blood pressure cuff around the upper part of your left arm, 1 inch (2.5 cm) above your elbow. It is best to wrap the cuff around bare skin. 6. Fit the cuff snugly around your arm. You should be able to place only one finger between the cuff and your arm. 7. Position the cord so that it rests in the bend of your elbow. 8. Press the power  button. 9. Sit quietly while the cuff inflates and deflates. 10. Read the digital reading on the monitor screen and write the numbers down (record them) in a notebook. 11. Wait 2-3 minutes, then repeat the steps, starting at step 1.   What does my blood pressure reading mean? A blood pressure reading consists of a higher number over a lower number. Ideally, your blood pressure should be below 120/80. The first ("top") number is called the systolic pressure. It is a measure of the pressure in your arteries as your heart beats. The second ("bottom") number is called the diastolic pressure. It is a measure of the pressure in your arteries as the heart relaxes. Blood pressure is classified into five stages. The following are the stages for adults who do not have a short-term serious illness or a chronic condition. Systolic pressure and diastolic pressure are measured in a unit called mm Hg (millimeters of mercury).  Normal  Systolic pressure: below 120.  Diastolic pressure: below 80. Elevated  Systolic pressure: 120-129.  Diastolic pressure: below 80. Hypertension stage 1  Systolic pressure: 130-139.  Diastolic pressure: 80-89. Hypertension stage 2  Systolic pressure: 140 or above.  Diastolic pressure: 90 or above. You can have elevated blood pressure or hypertension even if only the systolic or only the diastolic number in your reading is higher than normal. Follow these instructions at home:  Check your blood   pressure as often as recommended by your health care provider.  Check your blood pressure at the same time every day.  Take your monitor to the next appointment with your health care provider to make sure that: ? You are using it correctly. ? It provides accurate readings.  Be sure you understand what your goal blood pressure numbers are.  Tell your health care provider if you are having any side effects from blood pressure medicine.  Keep all follow-up visits as told by  your health care provider. This is important. General tips  Your health care provider can suggest a reliable monitor that will meet your needs. There are several types of home blood pressure monitors.  Choose a monitor that has an arm cuff. Do not choose a monitor that measures your blood pressure from your wrist or finger.  Choose a cuff that wraps snugly around your upper arm. You should be able to fit only one finger between your arm and the cuff.  You can buy a blood pressure monitor at most drugstores or online. Where to find more information American Heart Association: www.heart.org Contact a health care provider if:  Your blood pressure is consistently high. Get help right away if:  Your systolic blood pressure is higher than 180.  Your diastolic blood pressure is higher than 120. Summary  Blood pressure is a measurement of how strongly your blood is pressing against the walls of your arteries.  A blood pressure reading consists of a higher number over a lower number. Ideally, your blood pressure should be below 120/80.  Check your blood pressure at the same time every day.  Avoid caffeine, alcohol, smoking, and exercise for 30 minutes prior to checking your blood pressure. These agents can affect the accuracy of the blood pressure reading. This information is not intended to replace advice given to you by your health care provider. Make sure you discuss any questions you have with your health care provider. Document Revised: 12/18/2018 Document Reviewed: 12/18/2018 Elsevier Patient Education  2021 Elsevier Inc.  

## 2020-02-25 ENCOUNTER — Encounter (HOSPITAL_COMMUNITY): Payer: Self-pay | Admitting: Family Medicine

## 2020-02-28 ENCOUNTER — Ambulatory Visit: Payer: Medicaid Other | Admitting: Obstetrics and Gynecology

## 2020-03-02 ENCOUNTER — Encounter (HOSPITAL_COMMUNITY): Payer: Self-pay | Admitting: Family Medicine

## 2020-03-14 ENCOUNTER — Encounter: Payer: Self-pay | Admitting: Obstetrics

## 2020-03-14 ENCOUNTER — Other Ambulatory Visit (HOSPITAL_COMMUNITY)
Admission: RE | Admit: 2020-03-14 | Discharge: 2020-03-14 | Disposition: A | Discharge status: Home / Self Care | Payer: Medicaid Other | Source: Ambulatory Visit | Attending: Obstetrics | Admitting: Obstetrics

## 2020-03-14 ENCOUNTER — Ambulatory Visit (INDEPENDENT_AMBULATORY_CARE_PROVIDER_SITE_OTHER): Payer: Medicaid Other | Admitting: Obstetrics

## 2020-03-14 ENCOUNTER — Other Ambulatory Visit: Payer: Self-pay

## 2020-03-14 DIAGNOSIS — N898 Other specified noninflammatory disorders of vagina: Secondary | ICD-10-CM | POA: Insufficient documentation

## 2020-03-14 DIAGNOSIS — R87612 Low grade squamous intraepithelial lesion on cytologic smear of cervix (LGSIL): Secondary | ICD-10-CM

## 2020-03-14 DIAGNOSIS — O165 Unspecified maternal hypertension, complicating the puerperium: Secondary | ICD-10-CM

## 2020-03-14 DIAGNOSIS — M549 Dorsalgia, unspecified: Secondary | ICD-10-CM

## 2020-03-14 DIAGNOSIS — Z8759 Personal history of other complications of pregnancy, childbirth and the puerperium: Secondary | ICD-10-CM

## 2020-03-14 MED ORDER — IBUPROFEN 800 MG PO TABS: 800.0000 mg | ORAL_TABLET | Freq: Three times a day (TID) | ORAL | 5 refills | Status: DC | PRN

## 2020-03-14 MED ORDER — CYCLOBENZAPRINE HCL 10 MG PO TABS: 10.0000 mg | ORAL_TABLET | Freq: Two times a day (BID) | ORAL | 1 refills | Status: AC | PRN

## 2020-03-14 NOTE — Addendum Note (Signed)
Addended by: Hamilton Capri on: 03/14/2020 04:47 PM   Modules accepted: Orders

## 2020-03-14 NOTE — Progress Notes (Addendum)
Patient presents for PP.    Post Partum Visit Note  Cindy Pearson is a 25 y.o. [id number] female who presents for a postpartum visit. She is 8 weeks postpartum following a normal spontaneous vaginal delivery and primary cesarean section.  I have fully reviewed the prenatal and intrapartum course. The delivery was at 37 gestational weeks.  Anesthesia: spinal. Postpartum course has been well, but having some lower back pain. Baby is doing well. Baby is feeding by both breast and bottle - [name]. Bleeding no bleeding. Bowel function is normal. Bladder function is normal. Patient is sexually active. Contraception method is tubal ligation. Postpartum depression screening: negative.  She complains of vaginal discharge.   The pregnancy intention screening data noted above was reviewed. Potential methods of contraception were discussed. The patient had Female Sterilization done with C/S.   Edinburgh Postnatal Depression Scale - 03/14/20 0931      Edinburgh Postnatal Depression Scale:  In the Past 7 Days   I have been able to laugh and see the funny side of things. 0    I have looked forward with enjoyment to things. 0    I have blamed myself unnecessarily when things went wrong. 0    I have been anxious or worried for no good reason. 0    I have felt scared or panicky for no good reason. 0    Things have been getting on top of me. 0    I have been so unhappy that I have had difficulty sleeping. 0    I have felt sad or miserable. 0    I have been so unhappy that I have been crying. 0    The thought of harming myself has occurred to me. 0    Edinburgh Postnatal Depression Scale Total 0            The following portions of the patient's history were reviewed and updated as appropriate: allergies, current medications, past family history, past medical history, past social history, past surgical history and problem list.  Review of Systems A comprehensive review of systems was negative.  , except for vaginal discharge  Objective:  BP 95/70   Pulse 72   Ht 5' (1.524 m)   Wt 167 lb 1.6 oz (75.8 kg)   Breastfeeding Yes   BMI 32.63 kg/m    General:  alert and no distress   Breasts:  inspection negative, no nipple discharge or bleeding, no masses or nodularity palpable  Lungs: clear to auscultation bilaterally  Heart:  regular rate and rhythm, S1, S2 normal, no murmur, click, rub or gallop  Abdomen: soft, non-tender; bowel sounds normal; no masses,  no organomegaly   Vulva:  normal  Vagina: normal vagina  Cervix:  no cervical motion tenderness and no lesions  Corpus: normal size, contour, position, consistency, mobility, non-tender  Adnexa:  no mass, fullness, tenderness  Rectal Exam: Not performed.        Assessment:    1. Postpartum state - doing well  2. History of pre-eclampsia  3. Postpartum hypertension, resolved - discontinue Procardia - continue to monitor BP at home  4. Backache with radiation in area of epidural down left leg Rx: - ibuprofen (ADVIL) 800 MG tablet; Take 1 tablet (800 mg total) by mouth every 8 (eight) hours as needed.  Dispense: 30 tablet; Refill: 5 - cyclobenzaprine (FLEXERIL) 10 MG tablet; Take 1 tablet (10 mg total) by mouth 2 (two) times daily as needed for muscle spasms.  Dispense: 20 tablet; Refill: 1  5. LGSIL on Pap smear of cervix - colposcopy schedule at 3 months postpartum  6. Vaginal Discharge Rx: -Cervicovaginal ancillary only (  )   Plan:   Essential components of care per ACOG recommendations:  1.  Mood and well being: Patient with negative depression screening today.  - Patient does use tobacco. If using tobacco we discussed reduction and for recently cessation risk of relapse - hx of drug use? Yes, marijuana.   If yes, discussed support systems  2. Infant care and feeding:  -Patient currently breastmilk feeding? Yes If breastmilk feeding discussed return to work and pumping. If needed, patient  was provided letter for work to allow for every 2-3 hr pumping breaks, and to be granted a private location to express breastmilk and refrigerated area to store breastmilk. Reviewed importance of draining breast regularly to support lactation. -Social determinants of health (SDOH) reviewed in EPIC. No concerns  3. Sexuality, contraception and birth spacing - Patient does not want a pregnancy in the next year.  Desired family size is 5 children.  - Reviewed forms of contraception in tiered fashion.  Patient had sterilization done with C/S   4. Sleep and fatigue -Encouraged family/partner/community support of 4 hrs of uninterrupted sleep to help with mood and fatigue  5. Physical Recovery  - Discussed patients delivery by C/S.  No complications. - Patient has urinary incontinence? No - Patient is safe to resume physical and sexual activity  6.  Health Maintenance - Last pap smear done 07-29-2019 and was LGSIL   7. No Chronic Disease   Coral Ceo, MD Center for [facility], [facility] 03/14/20

## 2020-03-16 ENCOUNTER — Other Ambulatory Visit: Payer: Self-pay | Admitting: Obstetrics

## 2020-03-16 DIAGNOSIS — N760 Acute vaginitis: Secondary | ICD-10-CM

## 2020-03-16 DIAGNOSIS — B3731 Acute candidiasis of vulva and vagina: Secondary | ICD-10-CM

## 2020-03-16 DIAGNOSIS — A749 Chlamydial infection, unspecified: Secondary | ICD-10-CM

## 2020-03-16 DIAGNOSIS — B9689 Other specified bacterial agents as the cause of diseases classified elsewhere: Secondary | ICD-10-CM

## 2020-03-16 DIAGNOSIS — B373 Candidiasis of vulva and vagina: Secondary | ICD-10-CM

## 2020-03-16 LAB — CERVICOVAGINAL ANCILLARY ONLY
Bacterial Vaginitis (gardnerella): POSITIVE — AB
Candida Glabrata: POSITIVE — AB
Candida Vaginitis: NEGATIVE
Chlamydia: POSITIVE — AB
Comment: NEGATIVE
Comment: NEGATIVE
Comment: NEGATIVE
Comment: NEGATIVE
Comment: NEGATIVE
Comment: NORMAL
Neisseria Gonorrhea: NEGATIVE
Trichomonas: NEGATIVE

## 2020-03-16 MED ORDER — METRONIDAZOLE 500 MG PO TABS: 500.0000 mg | ORAL_TABLET | Freq: Two times a day (BID) | ORAL | 2 refills | Status: DC

## 2020-03-16 MED ORDER — DOXYCYCLINE HYCLATE 100 MG PO CAPS: 100.0000 mg | ORAL_CAPSULE | Freq: Two times a day (BID) | ORAL | 0 refills | Status: DC

## 2020-03-16 MED ORDER — FLUCONAZOLE 150 MG PO TABS: 150.0000 mg | ORAL_TABLET | Freq: Once | ORAL | 0 refills | Status: AC

## 2020-04-11 ENCOUNTER — Ambulatory Visit: Payer: Medicaid Other | Admitting: Obstetrics

## 2020-04-12 ENCOUNTER — Ambulatory Visit: Payer: Medicaid Other | Admitting: Obstetrics

## 2020-08-15 ENCOUNTER — Encounter (HOSPITAL_COMMUNITY): Payer: Self-pay

## 2020-08-15 ENCOUNTER — Other Ambulatory Visit: Payer: Self-pay

## 2020-08-15 ENCOUNTER — Ambulatory Visit (HOSPITAL_COMMUNITY)
Admission: RE | Admit: 2020-08-15 | Discharge: 2020-08-15 | Disposition: A | Discharge status: Home / Self Care | Payer: Medicaid Other | Source: Ambulatory Visit | Attending: Student | Admitting: Student

## 2020-08-15 VITALS — BP 110/76 | HR 61 | Temp 98.3°F | Resp 18

## 2020-08-15 DIAGNOSIS — U071 COVID-19: Secondary | ICD-10-CM | POA: Diagnosis not present

## 2020-08-15 MED ORDER — FLUTICASONE PROPIONATE 50 MCG/ACT NA SUSP: 2.0000 | Freq: Two times a day (BID) | NASAL | 2 refills | Status: AC

## 2020-08-15 NOTE — ED Triage Notes (Signed)
Pt presents covid + as of Saturday 08/12/2020; pt states she has slight non productive cough, nasal drainage, and generalized body aches.

## 2020-08-15 NOTE — ED Provider Notes (Signed)
[place]    CSN: [id number] Arrival date & time: 08/15/20  1032      History   Chief Complaint Chief Complaint  Patient presents with   Covid +     HPI Cindy Pearson is a 25 y.o. female presenting with covid-19- diagnosed 8/6. 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 Last diagnosed with covid-19 7 months ago. States symptoms are improving but she is desiring work note to return to work. Initially congestion, cough, bodyaches. Now only congestion remains. Denies any shortness of breath or wheezing; did not require her albuterol. Denies fevers/chills, n/v/d, shortness of breath, chest pain, cough, facial pain, teeth pain, headaches, sore throat, loss of taste/smell, swollen lymph nodes, ear pain.    HPI  Past Medical History:  Diagnosis Date   Asthma    Albuterol INH prn   Blood transfusion without reported diagnosis    Chlamydia 2012   hasn't picked up rx, not treated yet   Herpes    last outbreak 3 months ago   History of postpartum hemorrhage    Preeclampsia in postpartum period    Patient reported   Preterm labor 2011    Patient Active Problem List   Diagnosis Date Noted   Cesarean delivery delivered 01/13/2020   History of pre-eclampsia 10/11/2019   LGSIL on Pap smear of cervix 10/11/2019   Supervision of normal pregnancy, antepartum 07/29/2019   Supervision of other normal pregnancy, antepartum 09/03/2017   Chlamydia 06/13/2016   S/P cesarean section 05/26/2016   IUGR (intrauterine growth restriction) affecting care of mother 05/17/2016    Past Surgical History:  Procedure Laterality Date   CESAREAN SECTION     CESAREAN SECTION N/A 05/26/2016   Procedure: CESAREAN SECTION;  Surgeon: Florian Buff, MD;  Location: [place];  Service: Obstetrics;  Laterality: N/A;   CESAREAN SECTION N/A 02/05/2018   Procedure: CESAREAN SECTION;  Surgeon: Sloan Leiter, MD;  Location: [place];  Service: Obstetrics;   Laterality: N/A;   CESAREAN SECTION N/A 01/13/2020   Procedure: CESAREAN SECTION;  Surgeon: Donnamae Jude, MD;  Location: [facility];  Service: Obstetrics;  Laterality: N/A;   TONSILLECTOMY AND ADENOIDECTOMY      OB History     Gravida  6   Para  5   Term  5   Preterm  0   AB  1   Living  5      SAB  1   IAB  0   Ectopic  0   Multiple  0   Live Births  5            Home Medications    Prior to Admission medications   Medication Sig Start Date End Date Taking? Authorizing Provider  fluticasone (FLONASE) 50 MCG/ACT nasal spray Place 2 sprays into both nostrils in the morning and at bedtime. 08/15/20  Yes Hazel Sams, PA-C  acetaminophen (TYLENOL) 500 MG tablet Take 2 tablets (1,000 mg total) by mouth every 8 (eight) hours. 03/15/01   Arrie Senate, MD  Blood Pressure Monitor KIT 1 Device by Does not apply route once a week. To be monitored Regularly at home. 07/29/19   Shelly Bombard, MD  coconut oil OIL Apply 1 application topically as needed. 0/0/92   Arrie Senate, MD  cyclobenzaprine (FLEXERIL) 10 MG tablet Take 1 tablet (10 mg total) by mouth 2 (two) times daily as needed for muscle spasms. 03/14/20  Shelly Bombard, MD  doxycycline (VIBRAMYCIN) 100 MG capsule Take 1 capsule (100 mg total) by mouth 2 (two) times daily. 03/16/20   Shelly Bombard, MD  ferrous sulfate 325 (65 FE) MG tablet Take 1 tablet (325 mg total) by mouth daily. Patient not taking: No sig reported 09/25/19   Wende Mott, CNM  ibuprofen (ADVIL) 800 MG tablet Take 1 tablet (800 mg total) by mouth every 8 (eight) hours as needed. 03/14/20   Shelly Bombard, MD  metroNIDAZOLE (FLAGYL) 500 MG tablet Take 1 tablet (500 mg total) by mouth 2 (two) times daily. 03/16/20   Shelly Bombard, MD  NIFEdipine (PROCARDIA XL) 30 MG 24 hr tablet Take 1 tablet (30 mg total) by mouth daily. 01/20/20   Gavin Pound, CNM  Prenatal Vit-Fe Phos-FA-Omega (VITAFOL GUMMIES) 3.33-0.333-34.8 MG CHEW  Chew 3 tablets by mouth daily before breakfast. 12/22/19   Shelly Bombard, MD    Family History Family History  Problem Relation Age of Onset   Diabetes Maternal Grandmother    Heart disease Maternal Grandfather    Cancer Maternal Grandfather    Heart disease Paternal Grandfather    Cancer Father     Social History Social History   Tobacco Use   Smoking status: Some Days    Types: Cigars    Last attempt to quit: 05/19/2019    Years since quitting: 1.2   Smokeless tobacco: Never   Tobacco comments:    started again 02/11/2018  Vaping Use   Vaping Use: Former  Substance Use Topics   Alcohol use: Not Currently    Comment: occasional   Drug use: Not Currently    Types: Marijuana    Comment: last smoked in January 2022     Allergies   Penicillins   Review of Systems Review of Systems  Constitutional:  Negative for appetite change, chills and fever.  HENT:  Positive for congestion. Negative for ear pain, rhinorrhea, sinus pressure, sinus pain and sore throat.   Eyes:  Negative for redness and visual disturbance.  Respiratory:  Negative for cough, chest tightness, shortness of breath and wheezing.   Cardiovascular:  Negative for chest pain and palpitations.  Gastrointestinal:  Negative for abdominal pain, constipation, diarrhea, nausea and vomiting.  Genitourinary:  Negative for dysuria, frequency and urgency.  Musculoskeletal:  Negative for myalgias.  Neurological:  Negative for dizziness, weakness and headaches.  Psychiatric/Behavioral:  Negative for confusion.   All other systems reviewed and are negative.   Physical Exam Triage Vital Signs ED Triage Vitals  Enc Vitals Group     BP 08/15/20 1109 110/76     Pulse Rate 08/15/20 1109 61     Resp 08/15/20 1109 18     Temp 08/15/20 1109 98.3 F (36.8 C)     Temp Source 08/15/20 1109 Oral     SpO2 08/15/20 1109 97 %     Weight --      Height --      Head Circumference --      Peak Flow --      Pain Score  08/15/20 1104 5     Pain Loc --      Pain Edu? --      Excl. in [place]? --    No data found.  Updated Vital Signs BP 110/76 (BP Location: Left Arm)   Pulse 61   Temp 98.3 F (36.8 C) (Oral)   Resp 18   LMP 08/01/2020   SpO2 97%  Visual Acuity Right Eye Distance:   Left Eye Distance:   Bilateral Distance:    Right Eye Near:   Left Eye Near:    Bilateral Near:     Physical Exam Vitals reviewed.  Constitutional:      General: She is not in acute distress.    Appearance: Normal appearance. She is not ill-appearing.  HENT:     Head: Normocephalic and atraumatic.     Right Ear: Tympanic membrane, ear canal and external ear normal. No tenderness. No middle ear effusion. There is no impacted cerumen. Tympanic membrane is not perforated, erythematous, retracted or bulging.     Left Ear: Tympanic membrane, ear canal and external ear normal. No tenderness.  No middle ear effusion. There is no impacted cerumen. Tympanic membrane is not perforated, erythematous, retracted or bulging.     Nose: Nose normal. No congestion.     Mouth/Throat:     Mouth: Mucous membranes are moist.     Pharynx: Uvula midline. No oropharyngeal exudate or posterior oropharyngeal erythema.  Eyes:     Extraocular Movements: Extraocular movements intact.     Pupils: Pupils are equal, round, and reactive to light.  Cardiovascular:     Rate and Rhythm: Normal rate and regular rhythm.     Heart sounds: Normal heart sounds.  Pulmonary:     Effort: Pulmonary effort is normal.     Breath sounds: Normal breath sounds. No decreased breath sounds, wheezing, rhonchi or rales.  Abdominal:     Palpations: Abdomen is soft.     Tenderness: There is no abdominal tenderness. There is no guarding or rebound.  Neurological:     General: No focal deficit present.     Mental Status: She is alert and oriented to person, place, and time.  Psychiatric:        Mood and Affect: Mood normal.        Behavior: Behavior normal.         Thought Content: Thought content normal.        Judgment: Judgment normal.     UC Treatments / Results  Labs (all labs ordered are listed, but only abnormal results are displayed) Labs Reviewed - No data to display  EKG   Radiology No results found.  Procedures Procedures (including critical care time)  Medications Ordered in UC Medications - No data to display  Initial Impression / Assessment and Plan / UC Course  I have reviewed the triage vital signs and the nursing notes.  Pertinent labs & imaging results that were available during my care of the patient were reviewed by me and considered in my medical decision making (see chart for details).     This patient is a very pleasant 25 y.o. year old female presenting with Covid-19, diagnosed 8/6. Today this pt is afebrile nontachycardic nontachypneic, oxygenating well on room air, no wheezes rhonchi or rales. Flonase sent. ED return precautions discussed. Patient verbalizes understanding and agreement. Work note provided.   Final Clinical Impressions(s) / UC Diagnoses   Final diagnoses:  [id number]     Discharge Instructions      -Fluticasone nasal spray twice daily while symptoms persist, for about 1 week. You can continue for longer if symptoms persist.  -Come back and see [place] if new symptoms like new ear pain, facial pain, shortness of breath, etc.  -You are cleared to return to work this Friday 8/12     ED Prescriptions     Medication Sig Dispense Auth. Provider  fluticasone (FLONASE) 50 MCG/ACT nasal spray Place 2 sprays into both nostrils in the morning and at bedtime. 15 mL Hazel Sams, PA-C      PDMP not reviewed this encounter.   Hazel Sams, PA-C 08/15/20 1238

## 2020-08-15 NOTE — Discharge Instructions (Addendum)
-  Fluticasone nasal spray twice daily while symptoms persist, for about 1 week. You can continue for longer if symptoms persist.  -Come back and see [place] if new symptoms like new ear pain, facial pain, shortness of breath, etc.  -You are cleared to return to work this Friday 8/12

## 2020-12-29 ENCOUNTER — Ambulatory Visit (HOSPITAL_COMMUNITY): Payer: Self-pay

## 2021-01-25 ENCOUNTER — Ambulatory Visit (HOSPITAL_COMMUNITY): Payer: Self-pay

## 2021-02-06 ENCOUNTER — Ambulatory Visit (HOSPITAL_COMMUNITY): Payer: Self-pay

## 2021-02-10 ENCOUNTER — Encounter (HOSPITAL_COMMUNITY): Payer: Self-pay | Admitting: Emergency Medicine

## 2021-02-10 ENCOUNTER — Ambulatory Visit (HOSPITAL_COMMUNITY)
Admission: EM | Admit: 2021-02-10 | Discharge: 2021-02-10 | Disposition: A | Discharge status: Home / Self Care | Payer: Medicaid Other | Attending: Internal Medicine | Admitting: Internal Medicine

## 2021-02-10 ENCOUNTER — Ambulatory Visit (INDEPENDENT_AMBULATORY_CARE_PROVIDER_SITE_OTHER): Payer: Medicaid Other

## 2021-02-10 ENCOUNTER — Other Ambulatory Visit: Payer: Self-pay

## 2021-02-10 DIAGNOSIS — S62664A Nondisplaced fracture of distal phalanx of right ring finger, initial encounter for closed fracture: Secondary | ICD-10-CM

## 2021-02-10 DIAGNOSIS — N898 Other specified noninflammatory disorders of vagina: Secondary | ICD-10-CM | POA: Diagnosis present

## 2021-02-10 DIAGNOSIS — S6991XA Unspecified injury of right wrist, hand and finger(s), initial encounter: Secondary | ICD-10-CM | POA: Insufficient documentation

## 2021-02-10 LAB — POCT URINALYSIS DIPSTICK, ED / UC
Glucose, UA: NEGATIVE mg/dL
Hgb urine dipstick: NEGATIVE
Ketones, ur: NEGATIVE mg/dL
Nitrite: NEGATIVE
Protein, ur: 30 mg/dL — AB
Specific Gravity, Urine: 1.025 (ref 1.005–1.030)
Urobilinogen, UA: >=8 mg/dL (ref 0.0–1.0)
pH: 7 (ref 5.0–8.0)

## 2021-02-10 LAB — POC URINE PREG, ED: Preg Test, Ur: NEGATIVE

## 2021-02-10 NOTE — Discharge Instructions (Signed)
You will be notified of any concerning results of your x-ray, if your finger is broken please place splint back on it and reach out to the orthopedic doctor who is listed below for further evaluation  If area is not broken then you may use ibuprofen 800 mg 3 times 3 times a day for the next 5 days to reduce any irritation or inflammation  Labs pending 2-3 days, you will be contacted if positive for any sti and treatment will be sent to the pharmacy, you will have to return to the clinic if positive for gonorrhea to receive treatment   Please refrain from having sex until labs results, if positive please refrain from having sex until treatment complete and symptoms resolve   If positive for , Chlamydia  gonorrhea or trichomoniasis please notify partner or partners so they may tested as well  Moving forward, it is recommended you use some form of protection against the transmission of sti infections  such as condoms or dental dams with each sexual encounter

## 2021-02-10 NOTE — ED Provider Notes (Signed)
[place]    CSN: [id number] Arrival date & time: 02/10/21  1712      History   Chief Complaint Chief Complaint  Patient presents with   Finger Injury    HPI Cindy Pearson is a 26 y.o. female.   Patient presents with yellow vaginal discharge for 1 week.  Sexually active, 2 partners, no condom use.  Last menstrual period, first week of December, tubes tied.  Denies vaginal itching or irritation, urinary frequency or urgency, hematuria, dysuria, abdominal pain, flank pain, fever, chills.   Patient presents with right ring finger swelling and pain only when touched for 4 weeks.  Symptoms initially began after her hand collided with another person while holding telephone.  Did not initially seek treatment.  Was wearing splint intermittently and swelling improved however it has persisted.  Range of motion intact.  Denies numbness, tingling, prior injury or trauma.    Past Medical History:  Diagnosis Date   Asthma    Albuterol INH prn   Blood transfusion without reported diagnosis    Chlamydia 2012   hasn't picked up rx, not treated yet   Herpes    last outbreak 3 months ago   History of postpartum hemorrhage    Preeclampsia in postpartum period    Patient reported   Preterm labor 2011    Patient Active Problem List   Diagnosis Date Noted   Cesarean delivery delivered 01/13/2020   History of pre-eclampsia 10/11/2019   LGSIL on Pap smear of cervix 10/11/2019   Supervision of normal pregnancy, antepartum 07/29/2019   Supervision of other normal pregnancy, antepartum 09/03/2017   Chlamydia 06/13/2016   S/P cesarean section 05/26/2016   IUGR (intrauterine growth restriction) affecting care of mother 05/17/2016    Past Surgical History:  Procedure Laterality Date   CESAREAN SECTION     CESAREAN SECTION N/A 05/26/2016   Procedure: CESAREAN SECTION;  Surgeon: Florian Buff, MD;  Location: [place];  Service: Obstetrics;  Laterality: N/A;   CESAREAN  SECTION N/A 02/05/2018   Procedure: CESAREAN SECTION;  Surgeon: Sloan Leiter, MD;  Location: [place];  Service: Obstetrics;  Laterality: N/A;   CESAREAN SECTION N/A 01/13/2020   Procedure: CESAREAN SECTION;  Surgeon: Donnamae Jude, MD;  Location: [facility];  Service: Obstetrics;  Laterality: N/A;   TONSILLECTOMY AND ADENOIDECTOMY      OB History     Gravida  6   Para  5   Term  5   Preterm  0   AB  1   Living  5      SAB  1   IAB  0   Ectopic  0   Multiple  0   Live Births  5            Home Medications    Prior to Admission medications   Medication Sig Start Date End Date Taking? Authorizing Provider  acetaminophen (TYLENOL) 500 MG tablet Take 2 tablets (1,000 mg total) by mouth every 8 (eight) hours. 06/14/09   Arrie Senate, MD  Blood Pressure Monitor KIT 1 Device by Does not apply route once a week. To be monitored Regularly at home. 07/29/19   Shelly Bombard, MD  coconut oil OIL Apply 1 application topically as needed. 05/13/24   Arrie Senate, MD  cyclobenzaprine (FLEXERIL) 10 MG tablet Take 1 tablet (10 mg total) by mouth 2 (two) times daily as needed for muscle spasms. 03/14/20  Shelly Bombard, MD  doxycycline (VIBRAMYCIN) 100 MG capsule Take 1 capsule (100 mg total) by mouth 2 (two) times daily. 03/16/20   Shelly Bombard, MD  ferrous sulfate 325 (65 FE) MG tablet Take 1 tablet (325 mg total) by mouth daily. Patient not taking: No sig reported 09/25/19   Wende Mott, CNM  fluticasone [facility]) 50 MCG/ACT nasal spray Place 2 sprays into both nostrils in the morning and at bedtime. 08/15/20   Hazel Sams, PA-C  ibuprofen (ADVIL) 800 MG tablet Take 1 tablet (800 mg total) by mouth every 8 (eight) hours as needed. 03/14/20   Shelly Bombard, MD  metroNIDAZOLE (FLAGYL) 500 MG tablet Take 1 tablet (500 mg total) by mouth 2 (two) times daily. 03/16/20   Shelly Bombard, MD  NIFEdipine (PROCARDIA XL) 30 MG 24 hr tablet Take 1  tablet (30 mg total) by mouth daily. 01/20/20   Gavin Pound, CNM  Prenatal Vit-Fe Phos-FA-Omega (VITAFOL GUMMIES) 3.33-0.333-34.8 MG CHEW Chew 3 tablets by mouth daily before breakfast. 12/22/19   Shelly Bombard, MD    Family History Family History  Problem Relation Age of Onset   Diabetes Maternal Grandmother    Heart disease Maternal Grandfather    Cancer Maternal Grandfather    Heart disease Paternal Grandfather    Cancer Father     Social History Social History   Tobacco Use   Smoking status: Some Days    Types: Cigars    Last attempt to quit: 05/19/2019    Years since quitting: 1.7   Smokeless tobacco: Never   Tobacco comments:    started again 02/11/2018  Vaping Use   Vaping Use: Former  Substance Use Topics   Alcohol use: Not Currently    Comment: occasional   Drug use: Not Currently    Types: Marijuana    Comment: last smoked in January 2022     Allergies   Penicillins   Review of Systems Review of Systems Defer to HPI    Physical Exam Triage Vital Signs ED Triage Vitals  Enc Vitals Group     BP 02/10/21 1803 119/75     Pulse Rate 02/10/21 1803 79     Resp 02/10/21 1803 17     Temp 02/10/21 1803 98.2 F (36.8 C)     Temp Source 02/10/21 1803 Oral     SpO2 02/10/21 1803 97 %     Weight --      Height --      Head Circumference --      Peak Flow --      Pain Score 02/10/21 1802 7     Pain Loc --      Pain Edu? --      Excl. in [place]? --    No data found.  Updated Vital Signs BP 119/75 (BP Location: Right Arm)    Pulse 79    Temp 98.2 F (36.8 C) (Oral)    Resp 17    SpO2 97%   Visual Acuity Right Eye Distance:   Left Eye Distance:   Bilateral Distance:    Right Eye Near:   Left Eye Near:    Bilateral Near:     Physical Exam Constitutional:      Appearance: Normal appearance.  HENT:     Head: Normocephalic.  Eyes:     Extraocular Movements: Extraocular movements intact.  Pulmonary:     Effort: Pulmonary effort is normal.   Genitourinary:    Comments:  Deferred Musculoskeletal:     Comments: Tenderness on the lateral medial aspect of the distal phalanx of the right middle finger, mild swelling noted, no ecchymosis or deformity noted, range of motion intact, sensation intact, capillary refill less than 3  Skin:    General: Skin is warm and dry.  Neurological:     Mental Status: She is alert and oriented to person, place, and time. Mental status is at baseline.  Psychiatric:        Mood and Affect: Mood normal.        Behavior: Behavior normal.     UC Treatments / Results  Labs (all labs ordered are listed, but only abnormal results are displayed) Labs Reviewed  POC URINE PREG, ED  POCT URINALYSIS DIPSTICK, ED / UC    EKG   Radiology No results found.  Procedures Procedures (including critical care time)  Medications Ordered in UC Medications - No data to display  Initial Impression / Assessment and Plan / UC Course  I have reviewed the triage vital signs and the nursing notes.  Pertinent labs & imaging results that were available during my care of the patient were reviewed by me and considered in my medical decision making (see chart for details).  Vaginal discharge Injury of finger of the right hand, initial encounter  1.  STI screening pending, will treat per protocol, advised abstinence until lab results and/or treatment is complete and all symptoms have resolved, advised condom use during all sexual encounters moving forward, may follow-up with urgent care as needed 2.  X-ray of the right middle finger pending, patient unable to wait for test results, discussed treatment plan that if fracture present patient to reapply splint and follow-up with orthopedic doctor, given walking referral, may use over-the-counter Tylenol or ibuprofen when pain is present, if no fracture noted patient to use ibuprofen 800 mg 3 times daily for 5 days to reduce any irritation and inflammation, may apply heat in  15-minute intervals and watchfully wait for healing Final Clinical Impressions(s) / UC Diagnoses   Final diagnoses:  None   Discharge Instructions   None    ED Prescriptions   None    PDMP not reviewed this encounter.   Hans Eden, NP 02/11/21 0830

## 2021-02-10 NOTE — ED Triage Notes (Signed)
Pt reports playing around with SO week ago and had phone in hand that collided with him and causing pain in right ring finger. 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  Reports that hasnt had menstrual since December.

## 2021-02-12 LAB — CERVICOVAGINAL ANCILLARY ONLY
Bacterial Vaginitis (gardnerella): POSITIVE — AB
Candida Glabrata: NEGATIVE
Candida Vaginitis: POSITIVE — AB
Chlamydia: POSITIVE — AB
Comment: NEGATIVE
Comment: NEGATIVE
Comment: NEGATIVE
Comment: NEGATIVE
Comment: NEGATIVE
Comment: NORMAL
Neisseria Gonorrhea: POSITIVE — AB
Trichomonas: POSITIVE — AB

## 2021-02-13 ENCOUNTER — Telehealth (HOSPITAL_COMMUNITY): Payer: Self-pay | Admitting: Emergency Medicine

## 2021-02-13 MED ORDER — DOXYCYCLINE HYCLATE 100 MG PO CAPS: 100.0000 mg | ORAL_CAPSULE | Freq: Two times a day (BID) | ORAL | 0 refills | Status: AC

## 2021-02-13 MED ORDER — METRONIDAZOLE 500 MG PO TABS: 500.0000 mg | ORAL_TABLET | Freq: Two times a day (BID) | ORAL | 0 refills | Status: DC

## 2021-02-13 MED ORDER — FLUCONAZOLE 150 MG PO TABS: 150.0000 mg | ORAL_TABLET | Freq: Once | ORAL | 0 refills | Status: AC

## 2021-02-13 NOTE — Telephone Encounter (Signed)
Per protocol, patient will need treatment with IM Rocephin 500mg  for positive Gonorrhea.  Will also need treatment with doxycycline, metronidazole, and diflucan.   Contacted patient by phone.  Verified identity using two identifiers.  Provided positive result.  Reviewed safe sex practices, notifying partners, and refraining from sexual activities for 7 days from time of treatment.  Patient verified understanding, all questions answered.   HHS notified Verified pharmacy, prescriptions sent

## 2021-02-14 ENCOUNTER — Ambulatory Visit (HOSPITAL_COMMUNITY): Payer: Medicaid Other

## 2021-02-15 ENCOUNTER — Other Ambulatory Visit: Payer: Self-pay

## 2021-02-15 ENCOUNTER — Ambulatory Visit (HOSPITAL_COMMUNITY)
Admission: RE | Admit: 2021-02-15 | Discharge: 2021-02-15 | Disposition: A | Discharge status: Home / Self Care | Payer: Medicaid Other | Source: Ambulatory Visit | Attending: Urgent Care | Admitting: Urgent Care

## 2021-02-15 VITALS — BP 109/72 | HR 69 | Temp 99.4°F | Resp 17

## 2021-02-15 DIAGNOSIS — A5901 Trichomonal vulvovaginitis: Secondary | ICD-10-CM

## 2021-02-15 DIAGNOSIS — S62664D Nondisplaced fracture of distal phalanx of right ring finger, subsequent encounter for fracture with routine healing: Secondary | ICD-10-CM | POA: Diagnosis not present

## 2021-02-15 DIAGNOSIS — B3731 Acute candidiasis of vulva and vagina: Secondary | ICD-10-CM

## 2021-02-15 DIAGNOSIS — A549 Gonococcal infection, unspecified: Secondary | ICD-10-CM

## 2021-02-15 DIAGNOSIS — A749 Chlamydial infection, unspecified: Secondary | ICD-10-CM | POA: Diagnosis not present

## 2021-02-15 MED ORDER — CEFTRIAXONE SODIUM 500 MG IJ SOLR
500.0000 mg | Freq: Once | INTRAMUSCULAR | Status: AC
  Administered 2021-02-15: 500 mg via INTRAMUSCULAR

## 2021-02-15 MED ORDER — CEFTRIAXONE SODIUM 500 MG IJ SOLR
INTRAMUSCULAR | Status: AC
  Filled 2021-02-15: qty 500

## 2021-02-15 MED ORDER — LIDOCAINE HCL (PF) 1 % IJ SOLN
INTRAMUSCULAR | Status: AC
  Filled 2021-02-15: qty 2

## 2021-02-15 NOTE — ED Provider Notes (Signed)
[place]    CSN: [id number] Arrival date & time: 02/15/21  1507      History   Chief Complaint Chief Complaint  Patient presents with   appt 3pm    Finger Injury    HPI Cindy Pearson is a 26 y.o. female.   26 year old female presents primarily for a follow-up from her February 4 visit.  At that time patient was swabbed due to vaginal symptoms, and was positive for GC, chlamydia, trichomoniasis, BV, yeast.  Metronidazole, Doxy, fluconazole already called in for patient however she has not picked them up nor started them.  Patient presents in office today for a Rocephin injection.  Penicillin is listed as an allergy on her list, however she states she had a very mild rash as a baby.  She denies having had any additional reactions to these types of medications.    Past Medical History:  Diagnosis Date   Asthma    Albuterol INH prn   Blood transfusion without reported diagnosis    Chlamydia 2012   hasn't picked up rx, not treated yet   Herpes    last outbreak 3 months ago   History of postpartum hemorrhage    Preeclampsia in postpartum period    Patient reported   Preterm labor 2011    Patient Active Problem List   Diagnosis Date Noted   Cesarean delivery delivered 01/13/2020   History of pre-eclampsia 10/11/2019   LGSIL on Pap smear of cervix 10/11/2019   Supervision of normal pregnancy, antepartum 07/29/2019   Supervision of other normal pregnancy, antepartum 09/03/2017   Chlamydia 06/13/2016   S/P cesarean section 05/26/2016   IUGR (intrauterine growth restriction) affecting care of mother 05/17/2016    Past Surgical History:  Procedure Laterality Date   CESAREAN SECTION     CESAREAN SECTION N/A 05/26/2016   Procedure: CESAREAN SECTION;  Surgeon: Florian Buff, MD;  Location: [place];  Service: Obstetrics;  Laterality: N/A;   CESAREAN SECTION N/A 02/05/2018   Procedure: CESAREAN SECTION;  Surgeon: Sloan Leiter, MD;  Location: [place];  Service: Obstetrics;  Laterality: N/A;   CESAREAN SECTION N/A 01/13/2020   Procedure: CESAREAN SECTION;  Surgeon: Donnamae Jude, MD;  Location: [facility];  Service: Obstetrics;  Laterality: N/A;   TONSILLECTOMY AND ADENOIDECTOMY      OB History     Gravida  6   Para  5   Term  5   Preterm  0   AB  1   Living  5      SAB  1   IAB  0   Ectopic  0   Multiple  0   Live Births  5            Home Medications    Prior to Admission medications   Medication Sig Start Date End Date Taking? Authorizing Provider  acetaminophen (TYLENOL) 500 MG tablet Take 2 tablets (1,000 mg total) by mouth every 8 (eight) hours. 02/10/38   Arrie Senate, MD  Blood Pressure Monitor KIT 1 Device by Does not apply route once a week. To be monitored Regularly at home. 07/29/19   Shelly Bombard, MD  coconut oil OIL Apply 1 application topically as needed. 1/0/27   Arrie Senate, MD  cyclobenzaprine (FLEXERIL) 10 MG tablet Take 1 tablet (10 mg total) by mouth 2 (two) times daily as needed for muscle spasms. 03/14/20   Shelly Bombard, MD  doxycycline (VIBRAMYCIN) 100 MG capsule Take 1 capsule (100 mg total) by mouth 2 (two) times daily for 7 days. 02/13/21 02/20/21  Chase Picket, MD  ferrous sulfate 325 (65 FE) MG tablet Take 1 tablet (325 mg total) by mouth daily. Patient not taking: No sig reported 09/25/19   Wende Mott, CNM  fluticasone [facility]) 50 MCG/ACT nasal spray Place 2 sprays into both nostrils in the morning and at bedtime. 08/15/20   Hazel Sams, PA-C  ibuprofen (ADVIL) 800 MG tablet Take 1 tablet (800 mg total) by mouth every 8 (eight) hours as needed. 03/14/20   Shelly Bombard, MD  metroNIDAZOLE (FLAGYL) 500 MG tablet Take 1 tablet (500 mg total) by mouth 2 (two) times daily. 02/13/21   Chase Picket, MD  NIFEdipine (PROCARDIA XL) 30 MG 24 hr tablet Take 1 tablet (30 mg total) by mouth daily. 01/20/20   Gavin Pound, CNM  Prenatal Vit-Fe  Phos-FA-Omega (VITAFOL GUMMIES) 3.33-0.333-34.8 MG CHEW Chew 3 tablets by mouth daily before breakfast. 12/22/19   Shelly Bombard, MD    Family History Family History  Problem Relation Age of Onset   Diabetes Maternal Grandmother    Heart disease Maternal Grandfather    Cancer Maternal Grandfather    Heart disease Paternal Grandfather    Cancer Father     Social History Social History   Tobacco Use   Smoking status: Some Days    Types: Cigars    Last attempt to quit: 05/19/2019    Years since quitting: 1.7   Smokeless tobacco: Never   Tobacco comments:    started again 02/11/2018  Vaping Use   Vaping Use: Former  Substance Use Topics   Alcohol use: Not Currently    Comment: occasional   Drug use: Not Currently    Types: Marijuana    Comment: last smoked in January 2022     Allergies   Penicillins   Review of Systems Review of Systems As per hpi  Physical Exam Triage Vital Signs ED Triage Vitals [02/15/21 1523]  Enc Vitals Group     BP 109/72     Pulse Rate 69     Resp 17     Temp 99.4 F (37.4 C)     Temp Source Oral     SpO2 96 %     Weight      Height      Head Circumference      Peak Flow      Pain Score      Pain Loc      Pain Edu?      Excl. in [place]?    No data found.  Updated Vital Signs BP 109/72 (BP Location: Left Arm)    Pulse 69    Temp 99.4 F (37.4 C) (Oral)    Resp 17    SpO2 96%   Visual Acuity Right Eye Distance:   Left Eye Distance:   Bilateral Distance:    Right Eye Near:   Left Eye Near:    Bilateral Near:     Physical Exam Constitutional:      Appearance: Normal appearance.  HENT:     Head: Normocephalic.  Eyes:     Extraocular Movements: Extraocular movements intact.  Pulmonary:     Effort: Pulmonary effort is normal.  Genitourinary:    Comments: Deferred Musculoskeletal:     Comments: Minimal tenderness on the lateral and medial aspect of the distal phalanx of the right ring finger,  mild swelling noted, no  ecchymosis or deformity noted, range of motion intact, sensation intact, capillary refill less than 3  Skin:    General: Skin is warm and dry.  Neurological:     Mental Status: She is alert and oriented to person, place, and time. Mental status is at baseline.  Psychiatric:        Mood and Affect: Mood normal.        Behavior: Behavior normal.     UC Treatments / Results  Labs (all labs ordered are listed, but only abnormal results are displayed) Labs Reviewed - No data to display  EKG   Radiology No results found.  Procedures Procedures (including critical care time)  Medications Ordered in UC Medications  cefTRIAXone (ROCEPHIN) injection 500 mg (500 mg Intramuscular Given 02/15/21 1554)    Initial Impression / Assessment and Plan / UC Course  I have reviewed the triage vital signs and the nursing notes.  Pertinent labs & imaging results that were available during my care of the patient were reviewed by me and considered in my medical decision making (see chart for details).     Closed nondisplaced fracture of distal phalanx -reviewed images.  Recommended patient stay in a finger splint for the next 2 weeks and take anti-inflammatory medication as necessary.  Recommended she follow-up with her primary care physician for repeat imaging in 2 weeks.  No indication for orthopedic evaluation Gonorrhea -IM Rocephin given in office.  Does have history of penicillin allergy as a baby, mild hives that was the reaction.  No history of anaphylaxis with this.  Patient was monitored for 25 minutes after given the injection with no signs of adverse reaction. Chlamydia -doxycycline already called in, encouraged patient to start it today. Trichomonas -Flagyl already called in, recommended patient started today. Candidiasis -take 1 Diflucan today.  Take the next 1 after completion of all antibiotics.  Safe sex practices again encouraged.  Final Clinical Impressions(s) / UC Diagnoses    Final diagnoses:  Closed nondisplaced fracture of distal phalanx of right ring finger with routine healing, subsequent encounter  Gonorrhea  Chlamydia  Trichomonas vaginitis  Vaginal candidiasis     Discharge Instructions      Please pick up your prescription medication as previously called in. You will need to take metronidazole and doxycycline twice daily for a week. Fluconazole when you get it, and then again after your antibiotics are completed. You were treated in office today for gonorrhea. Please do not have any form of sexual intercourse until all antibiotics are completed, and all partners are also treated. Please wear your finger splint for the next 2 weeks.  Follow-up with your primary care physician for a recheck in 2 weeks     ED Prescriptions   None    PDMP not reviewed this encounter.   Chaney Malling, [place] 02/15/21 2119

## 2021-02-15 NOTE — ED Triage Notes (Signed)
Pt reports that she ws seen here on 2/4 and got a call to come back for shots and right 4th finger fracture.

## 2021-02-15 NOTE — Discharge Instructions (Signed)
Please pick up your prescription medication as previously called in. You will need to take metronidazole and doxycycline twice daily for a week. Fluconazole when you get it, and then again after your antibiotics are completed. You were treated in office today for gonorrhea. Please do not have any form of sexual intercourse until all antibiotics are completed, and all partners are also treated. Please wear your finger splint for the next 2 weeks.  Follow-up with your primary care physician for a recheck in 2 weeks

## 2021-04-22 ENCOUNTER — Ambulatory Visit: Payer: Medicaid Other

## 2021-07-13 ENCOUNTER — Ambulatory Visit (HOSPITAL_COMMUNITY): Payer: Self-pay

## 2021-09-07 ENCOUNTER — Ambulatory Visit (HOSPITAL_COMMUNITY): Payer: Self-pay

## 2021-09-10 ENCOUNTER — Encounter (HOSPITAL_COMMUNITY): Payer: Self-pay | Admitting: Emergency Medicine

## 2021-09-10 ENCOUNTER — Ambulatory Visit (HOSPITAL_COMMUNITY)
Admission: EM | Admit: 2021-09-10 | Discharge: 2021-09-10 | Disposition: A | Discharge status: Home / Self Care | Payer: Medicaid Other | Attending: Family Medicine | Admitting: Family Medicine

## 2021-09-10 ENCOUNTER — Ambulatory Visit (HOSPITAL_COMMUNITY): Payer: Self-pay

## 2021-09-10 DIAGNOSIS — Z3202 Encounter for pregnancy test, result negative: Secondary | ICD-10-CM

## 2021-09-10 DIAGNOSIS — Z202 Contact with and (suspected) exposure to infections with a predominantly sexual mode of transmission: Secondary | ICD-10-CM | POA: Diagnosis not present

## 2021-09-10 DIAGNOSIS — N898 Other specified noninflammatory disorders of vagina: Secondary | ICD-10-CM | POA: Diagnosis not present

## 2021-09-10 LAB — POCT URINALYSIS DIPSTICK, ED / UC
Bilirubin Urine: NEGATIVE
Glucose, UA: NEGATIVE mg/dL
Hgb urine dipstick: NEGATIVE
Ketones, ur: NEGATIVE mg/dL
Leukocytes,Ua: NEGATIVE
Nitrite: NEGATIVE
Protein, ur: NEGATIVE mg/dL
Specific Gravity, Urine: 1.025 (ref 1.005–1.030)
Urobilinogen, UA: 1 mg/dL (ref 0.0–1.0)
pH: 5.5 (ref 5.0–8.0)

## 2021-09-10 LAB — POC URINE PREG, ED
Preg Test, Ur: NEGATIVE
Preg Test, Ur: NEGATIVE

## 2021-09-10 LAB — HIV ANTIBODY (ROUTINE TESTING W REFLEX): HIV Screen 4th Generation wRfx: NONREACTIVE

## 2021-09-10 MED ORDER — DOXYCYCLINE HYCLATE 100 MG PO CAPS: 100.0000 mg | ORAL_CAPSULE | Freq: Two times a day (BID) | ORAL | 0 refills | Status: AC

## 2021-09-10 MED ORDER — CEFTRIAXONE SODIUM 500 MG IJ SOLR
INTRAMUSCULAR | Status: AC
  Filled 2021-09-10: qty 500

## 2021-09-10 MED ORDER — CEFTRIAXONE SODIUM 500 MG IJ SOLR
500.0000 mg | Freq: Once | INTRAMUSCULAR | Status: AC
  Administered 2021-09-10: 500 mg via INTRAMUSCULAR

## 2021-09-10 NOTE — ED Triage Notes (Signed)
Pt reports discomfort when urinating, feelings of urinary frequency and vaginal discharge x 2 days. Denies any known exposures.

## 2021-09-10 NOTE — Discharge Instructions (Addendum)
Your pregnancy test was negative  The urinalysis was negative Been given a shot of ceftriaxone 500 mg, for possible gonorrhea  Take doxycycline 100 mg --1 capsule 2 times daily for 7 days--this is for potential chlamydia

## 2021-09-10 NOTE — ED Provider Notes (Addendum)
[facility]    CSN: [id number] Arrival date & time: 09/10/21  1133      History   Chief Complaint Chief Complaint  Patient presents with   Exposure to STD    HPI Cindy Pearson is a 26 y.o. female.    Exposure to STD   Here for 2-day history of pelvic pressure, dysuria that is intermittent, and some vaginal discharge.  No fever or chills.  No vaginal itching  She has had her last period about 10 days ago.    In February she was positive for chlamydia, trichomonas, and gonorrhea.  She states this feels similar to that.  She is allergic to penicillin which causes hives.  She did not have any reaction to the ceftriaxone given in February   Past Medical History:  Diagnosis Date   Asthma    Albuterol INH prn   Blood transfusion without reported diagnosis    Chlamydia 2012   hasn't picked up rx, not treated yet   Herpes    last outbreak 3 months ago   History of postpartum hemorrhage    Preeclampsia in postpartum period    Patient reported   Preterm labor 2011    Patient Active Problem List   Diagnosis Date Noted   Cesarean delivery delivered 01/13/2020   History of pre-eclampsia 10/11/2019   LGSIL on Pap smear of cervix 10/11/2019   Supervision of normal pregnancy, antepartum 07/29/2019   Supervision of other normal pregnancy, antepartum 09/03/2017   Chlamydia 06/13/2016   S/P cesarean section 05/26/2016   IUGR (intrauterine growth restriction) affecting care of mother 05/17/2016    Past Surgical History:  Procedure Laterality Date   CESAREAN SECTION     CESAREAN SECTION N/A 05/26/2016   Procedure: CESAREAN SECTION;  Surgeon: Eure, Luther H, MD;  [facility];  Service: Obstetrics;  Laterality: N/A;   CESAREAN SECTION N/A 02/05/2018   Procedure: CESAREAN SECTION;  Surgeon: Davis, Kelly M, MD;  Location: [facility];  Service: Obstetrics;  Laterality: N/A;   CESAREAN SECTION N/A 01/13/2020   Procedure: CESAREAN SECTION;   Surgeon: Pratt, Tanya S, MD;  Location: [facility];  Service: Obstetrics;  Laterality: N/A;   TONSILLECTOMY AND ADENOIDECTOMY      OB History     Gravida  6   Para  5   Term  5   Preterm  0   AB  1   Living  5      SAB  1   IAB  0   Ectopic  0   Multiple  0   Live Births  5            Home Medications    Prior to Admission medications   Medication Sig Start Date End Date Taking? Authorizing Provider  doxycycline (VIBRAMYCIN) 100 MG capsule Take 1 capsule (100 mg total) by mouth 2 (two) times daily for 7 days. 09/10/21 09/17/21 Yes Banister, Pamela K, MD  acetaminophen (TYLENOL) 500 MG tablet Take 2 tablets (1,000 mg total) by mouth every 8 (eight) hours. 01/15/20   Firestone, Alicia C, MD  Blood Pressure Monitor KIT 1 Device by Does not apply route once a week. To be monitored Regularly at home. 07/29/19   Harper, Charles A, MD  coconut oil OIL Apply 1 application topically as needed. 01/15/20   Firestone, Alicia C, MD  cyclobenzaprine (FLEXERIL) 10 MG tablet Take 1 tablet (10 mg total) by mouth 2 (two) times daily as needed   for muscle spasms. 03/14/20   Harper, Charles A, MD  ferrous sulfate 325 (65 FE) MG tablet Take 1 tablet (325 mg total) by mouth daily. Patient not taking: No sig reported 09/25/19   Neill, Caroline M, CNM  fluticasone (FLONASE) 50 MCG/ACT nasal spray Place 2 sprays into both nostrils in the morning and at bedtime. 08/15/20   Graham, Laura E, PA-C  ibuprofen (ADVIL) 800 MG tablet Take 1 tablet (800 mg total) by mouth every 8 (eight) hours as needed. 03/14/20   Harper, Charles A, MD  metroNIDAZOLE (FLAGYL) 500 MG tablet Take 1 tablet (500 mg total) by mouth 2 (two) times daily. 02/13/21   Lamptey, Philip O, MD  NIFEdipine (PROCARDIA XL) 30 MG 24 hr tablet Take 1 tablet (30 mg total) by mouth daily. 01/20/20   Emly, Jessica, CNM  Prenatal Vit-Fe Phos-FA-Omega (VITAFOL GUMMIES) 3.33-0.333-34.8 MG CHEW Chew 3 tablets by mouth daily before breakfast. 12/22/19   Harper,  Charles A, MD    Family History Family History  Problem Relation Age of Onset   Diabetes Maternal Grandmother    Heart disease Maternal Grandfather    Cancer Maternal Grandfather    Heart disease Paternal Grandfather    Cancer Father     Social History Social History   Tobacco Use   Smoking status: Some Days    Types: Cigars    Last attempt to quit: 05/19/2019    Years since quitting: 2.3   Smokeless tobacco: Never   Tobacco comments:    started again 02/11/2018  Vaping Use   Vaping Use: Former  Substance Use Topics   Alcohol use: Not Currently    Comment: occasional   Drug use: Not Currently    Types: Marijuana    Comment: last smoked in January 2022     Allergies   Penicillins   Review of Systems Review of Systems   Physical Exam Triage Vital Signs ED Triage Vitals  Enc Vitals Group     BP 09/10/21 1224 109/73     Pulse Rate 09/10/21 1224 66     Resp 09/10/21 1224 18     Temp 09/10/21 1224 97.8 F (36.6 C)     Temp Source 09/10/21 1224 Oral     SpO2 09/10/21 1224 96 %     Weight --      Height --      Head Circumference --      Peak Flow --      Pain Score 09/10/21 1223 0     Pain Loc --      Pain Edu? --      Excl. in [place]? --    No data found.  Updated Vital Signs BP 109/73 (BP Location: Right Arm)   Pulse 66   Temp 97.8 F (36.6 C) (Oral)   Resp 18   SpO2 96%   Visual Acuity Right Eye Distance:   Left Eye Distance:   Bilateral Distance:    Right Eye Near:   Left Eye Near:    Bilateral Near:     Physical Exam   UC Treatments / Results  Labs (all labs ordered are listed, but only abnormal results are displayed) Labs Reviewed  HIV ANTIBODY (ROUTINE TESTING W REFLEX)  RPR  POC URINE PREG, ED  POCT URINALYSIS DIPSTICK, ED / UC  POC URINE PREG, ED  CERVICOVAGINAL ANCILLARY ONLY    EKG   Radiology No results found.  Procedures Procedures (including critical care time)  Medications Ordered in UC   Medications   cefTRIAXone (ROCEPHIN) injection 500 mg (has no administration in time range)    Initial Impression / Assessment and Plan / UC Course  I have reviewed the triage vital signs and the nursing notes.  Pertinent labs & imaging results that were available during my care of the patient were reviewed by me and considered in my medical decision making (see chart for details).     UPT is negative and urinalysis is negative.    She wanted to go ahead and have empiric treatment for the GC and chlamydia.    She is given educational materials on safe sex practices.  Also she is given information on various STIs. She opted for RPR and HIV screening also   Final Clinical Impressions(s) / UC Diagnoses   Final diagnoses:  Vaginal discharge     Discharge Instructions      Your pregnancy test was negative  The urinalysis was negative Been given a shot of ceftriaxone 500 mg, for possible gonorrhea  Take doxycycline 100 mg --1 capsule 2 times daily for 7 days--this is for potential chlamydia          ED Prescriptions     Medication Sig Dispense Auth. Provider   doxycycline (VIBRAMYCIN) 100 MG capsule Take 1 capsule (100 mg total) by mouth 2 (two) times daily for 7 days. 14 capsule Banister, Pamela K, MD      PDMP not reviewed this encounter.   Banister, Pamela K, MD 09/10/21 1317    Banister, Pamela K, MD 09/10/21 1317  

## 2021-09-11 LAB — CERVICOVAGINAL ANCILLARY ONLY
Bacterial Vaginitis (gardnerella): POSITIVE — AB
Candida Glabrata: NEGATIVE
Candida Vaginitis: NEGATIVE
Chlamydia: NEGATIVE
Comment: NEGATIVE
Comment: NEGATIVE
Comment: NEGATIVE
Comment: NEGATIVE
Comment: NEGATIVE
Comment: NORMAL
Neisseria Gonorrhea: POSITIVE — AB
Trichomonas: NEGATIVE

## 2021-09-11 LAB — RPR: RPR Ser Ql: NONREACTIVE

## 2021-09-12 ENCOUNTER — Telehealth (HOSPITAL_COMMUNITY): Payer: Self-pay | Admitting: Emergency Medicine

## 2021-09-12 MED ORDER — METRONIDAZOLE 0.75 % VA GEL: 1.0000 | Freq: Every day | VAGINAL | 0 refills | Status: AC

## 2022-02-10 IMAGING — DX DG CHEST 1V
1 series · 1 of 1 positions shown · non-contrast
Comparison: PA and lateral chest 08/02/2009.

CLINICAL DATA: Patient post partum 01/13/2020.  Chest tightness.

EXAM:
CHEST  1 VIEW

[chest]
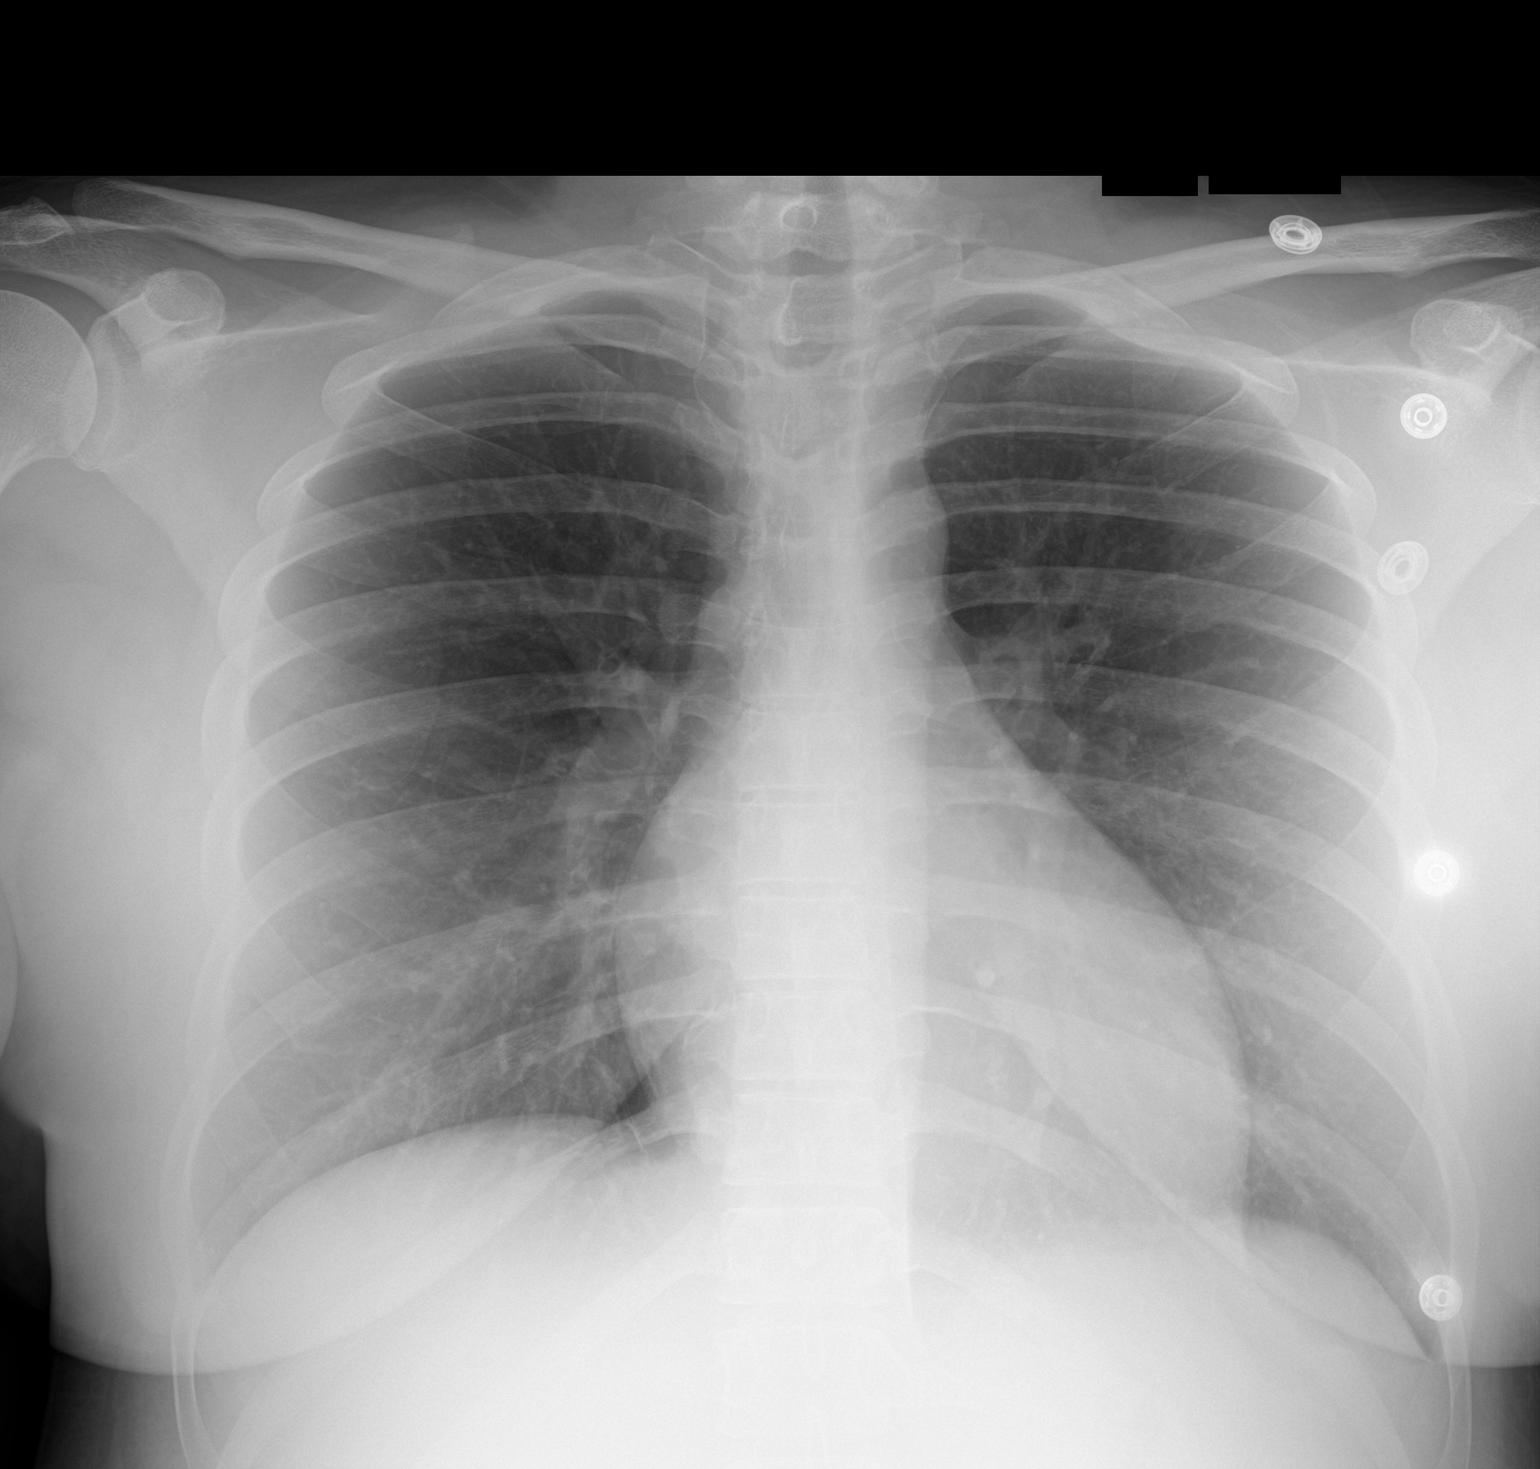

[1 of 1 positions shown; findings below may reference images not displayed]

FINDINGS: The lungs are clear. Heart size is normal. No pneumothorax or
pleural fluid. No bony abnormality.
IMPRESSION: Normal chest.

## 2022-09-05 ENCOUNTER — Ambulatory Visit (HOSPITAL_COMMUNITY)
Admission: EM | Admit: 2022-09-05 | Discharge: 2022-09-05 | Disposition: A | Discharge status: Home / Self Care | Payer: Medicaid Other | Source: Home / Self Care

## 2022-09-05 ENCOUNTER — Encounter (HOSPITAL_COMMUNITY): Payer: Self-pay

## 2022-09-05 DIAGNOSIS — L03319 Cellulitis of trunk, unspecified: Secondary | ICD-10-CM | POA: Insufficient documentation

## 2022-09-05 DIAGNOSIS — Z113 Encounter for screening for infections with a predominantly sexual mode of transmission: Secondary | ICD-10-CM | POA: Diagnosis not present

## 2022-09-05 DIAGNOSIS — L02211 Cutaneous abscess of abdominal wall: Secondary | ICD-10-CM | POA: Diagnosis not present

## 2022-09-05 DIAGNOSIS — L02219 Cutaneous abscess of trunk, unspecified: Secondary | ICD-10-CM | POA: Diagnosis not present

## 2022-09-05 LAB — HIV ANTIBODY (ROUTINE TESTING W REFLEX): HIV Screen 4th Generation wRfx: NONREACTIVE

## 2022-09-05 MED ORDER — LIDOCAINE-EPINEPHRINE-TETRACAINE (LET) TOPICAL GEL
3.0000 mL | Freq: Once | TOPICAL | Status: AC
  Administered 2022-09-05: 3 mL via TOPICAL

## 2022-09-05 MED ORDER — NAPROXEN 375 MG PO TABS
375.0000 mg | ORAL_TABLET | Freq: Two times a day (BID) | ORAL | 0 refills | Status: AC
Start: 2022-09-05 — End: ?

## 2022-09-05 MED ORDER — IBUPROFEN 800 MG PO TABS
ORAL_TABLET | ORAL | Status: AC
  Filled 2022-09-05: qty 1

## 2022-09-05 MED ORDER — DOXYCYCLINE HYCLATE 100 MG PO CAPS
100.0000 mg | ORAL_CAPSULE | Freq: Two times a day (BID) | ORAL | 0 refills | Status: DC
Start: 2022-09-05 — End: 2023-02-14

## 2022-09-05 MED ORDER — IBUPROFEN 800 MG PO TABS
800.0000 mg | ORAL_TABLET | Freq: Once | ORAL | Status: AC
  Administered 2022-09-05: 800 mg via ORAL

## 2022-09-05 MED ORDER — LIDOCAINE-EPINEPHRINE-TETRACAINE (LET) TOPICAL GEL
TOPICAL | Status: AC
  Filled 2022-09-05: qty 3

## 2022-09-05 NOTE — ED Provider Notes (Signed)
Renaldo Fiddler    CSN: [id number] Arrival date & time: 09/05/22  1357      History   Chief Complaint Chief Complaint  Patient presents with   Abscess   SEXUALLY TRANSMITTED DISEASE    HPI Cindy Pearson is a 27 y.o. female.  Patient presents today for evaluation of a painful abscess to her lower mid abdomen.  Patient denies any history of recurrent abscess.  She reports multiple over-the-counter and conservative treatments to try and have the abscess to open and drain on her own.  Reports pain has intensified over the last few days.  Denies any fever, nausea or vomiting.  Patient is also wanting STD testing.  She denies any symptoms known exposures.   Past Medical History:  Diagnosis Date   Asthma    Albuterol INH prn   Blood transfusion without reported diagnosis    Chlamydia 2012   hasn't picked up rx, not treated yet   Herpes    last outbreak 3 months ago   History of postpartum hemorrhage    Preeclampsia in postpartum period    Patient reported   Preterm labor 2011    Patient Active Problem List   Diagnosis Date Noted   Cesarean delivery delivered 01/13/2020   History of pre-eclampsia 10/11/2019   LGSIL on Pap smear of cervix 10/11/2019   Supervision of normal pregnancy, antepartum 07/29/2019   Supervision of other normal pregnancy, antepartum 09/03/2017   Chlamydia 06/13/2016   S/P cesarean section 05/26/2016   IUGR (intrauterine growth restriction) affecting care of mother 05/17/2016    Past Surgical History:  Procedure Laterality Date   CESAREAN SECTION     CESAREAN SECTION N/A 05/26/2016   Procedure: CESAREAN SECTION;  Surgeon: Lazaro Arms, MD;  Location: [facility] BIRTHING SUITES;  Service: Obstetrics;  Laterality: N/A;   CESAREAN SECTION N/A 02/05/2018   Procedure: CESAREAN SECTION;  Surgeon: Conan Bowens, MD;  Location: [facility] BIRTHING SUITES;  Service: Obstetrics;  Laterality: N/A;   CESAREAN SECTION N/A 01/13/2020   Procedure: CESAREAN SECTION;   Surgeon: Reva Bores, MD;  Location: [facility] LD ORS;  Service: Obstetrics;  Laterality: N/A;   TONSILLECTOMY AND ADENOIDECTOMY      OB History     Gravida  6   Para  5   Term  5   Preterm  0   AB  1   Living  5      SAB  1   IAB  0   Ectopic  0   Multiple  0   Live Births  5            Home Medications    Prior to Admission medications   Medication Sig Start Date End Date Taking? Authorizing Provider  doxycycline (VIBRAMYCIN) 100 MG capsule Take 1 capsule (100 mg total) by mouth 2 (two) times daily. 09/05/22  Yes Bing Neighbors, NP  naproxen (NAPROSYN) 375 MG tablet Take 1 tablet (375 mg total) by mouth 2 (two) times daily. 09/05/22  Yes Bing Neighbors, NP  acetaminophen (TYLENOL) 500 MG tablet Take 2 tablets (1,000 mg total) by mouth every 8 (eight) hours. 01/15/20   Alric Seton, MD  Blood Pressure Monitor KIT 1 Device by Does not apply route once a week. To be monitored Regularly at home. 07/29/19   Brock Bad, MD  coconut oil OIL Apply 1 application topically as needed. 01/15/20   Alric Seton, MD  cyclobenzaprine (FLEXERIL) 10 MG tablet  Take 1 tablet (10 mg total) by mouth 2 (two) times daily as needed for muscle spasms. 03/14/20   Brock Bad, MD  ferrous sulfate 325 (65 FE) MG tablet Take 1 tablet (325 mg total) by mouth daily. Patient not taking: Reported on 12/30/2019 09/25/19   Rolm Bookbinder, CNM  fluticasone [facility]) 50 MCG/ACT nasal spray Place 2 sprays into both nostrils in the morning and at bedtime. 08/15/20   Rhys Martini, PA-C  ibuprofen (ADVIL) 800 MG tablet Take 1 tablet (800 mg total) by mouth every 8 (eight) hours as needed. 03/14/20   Brock Bad, MD  metroNIDAZOLE (FLAGYL) 500 MG tablet Take 1 tablet (500 mg total) by mouth 2 (two) times daily for 7 days. 09/06/22 09/13/22  Merrilee Jansky, MD  NIFEdipine (PROCARDIA XL) 30 MG 24 hr tablet Take 1 tablet (30 mg total) by mouth daily. 01/20/20   Gerrit Heck, CNM   Prenatal Vit-Fe Phos-FA-Omega (VITAFOL GUMMIES) 3.33-0.333-34.8 MG CHEW Chew 3 tablets by mouth daily before breakfast. 12/22/19   Brock Bad, MD    Family History Family History  Problem Relation Age of Onset   Diabetes Maternal Grandmother    Heart disease Maternal Grandfather    Cancer Maternal Grandfather    Heart disease Paternal Grandfather    Cancer Father     Social History Social History   Tobacco Use   Smoking status: Some Days    Types: Cigars    Last attempt to quit: 05/19/2019    Years since quitting: 3.3   Smokeless tobacco: Never   Tobacco comments:    started again 02/11/2018  Vaping Use   Vaping status: Former  Substance Use Topics   Alcohol use: Not Currently    Comment: occasional   Drug use: Not Currently    Types: Marijuana    Comment: last smoked in January 2022     Allergies   Penicillins   Review of Systems Review of Systems Pertinent negatives listed in HPI   Physical Exam Triage Vital Signs ED Triage Vitals  Encounter Vitals Group     BP 09/05/22 1511 117/80     Systolic BP Percentile --      Diastolic BP Percentile --      Pulse Rate 09/05/22 1511 82     Resp 09/05/22 1511 16     Temp 09/05/22 1511 98.3 F (36.8 C)     Temp Source 09/05/22 1511 Oral     SpO2 09/05/22 1511 99 %     Weight 09/05/22 1510 130 lb (59 kg)     Height 09/05/22 1510 5' (1.524 m)     Head Circumference --      Peak Flow --      Pain Score 09/05/22 1510 8     Pain Loc --      Pain Education --      Exclude from Growth Chart --    No data found.  Updated Vital Signs BP 117/80 (BP Location: Right Arm)   Pulse 82   Temp 98.3 F (36.8 C) (Oral)   Resp 16   Ht 5' (1.524 m)   Wt 130 lb (59 kg)   LMP 09/04/2022 (Exact Date)   SpO2 99%   Breastfeeding No   BMI 25.39 kg/m   Visual Acuity Right Eye Distance:   Left Eye Distance:   Bilateral Distance:    Right Eye Near:   Left Eye Near:    Bilateral Near:  Physical  Exam Constitutional:      Appearance: Normal appearance.  HENT:     Head: Normocephalic and atraumatic.  Cardiovascular:     Rate and Rhythm: Normal rate and regular rhythm.  Pulmonary:     Effort: Pulmonary effort is normal.     Breath sounds: Normal breath sounds.  Abdominal:    Neurological:     General: No focal deficit present.     Mental Status: She is alert.      UC Treatments / Results  Labs (all labs ordered are listed, but only abnormal results are displayed) Labs Reviewed  CERVICOVAGINAL ANCILLARY ONLY - Abnormal; Notable for the following components:      Result Value   Chlamydia Positive (*)    Trichomonas Positive (*)    Bacterial Vaginitis (gardnerella) Positive (*)    All other components within normal limits  RPR  HIV ANTIBODY (ROUTINE TESTING W REFLEX)    EKG   Radiology No results found.  Procedures Incision and Drainage  Date/Time: 09/05/2022 4:20 PM  Performed by: Bing Neighbors, NP Authorized by: Bing Neighbors, NP   Consent:    Consent obtained:  Verbal   Consent given by:  Patient   Risks, benefits, and alternatives were discussed: yes     Risks discussed:  Incomplete drainage   Alternatives discussed:  No treatment Universal protocol:    Patient identity confirmed:  Verbally with patient Location:    Type:  Abscess   Location:  Trunk   Trunk location:  Abdomen Pre-procedure details:    Skin preparation:  Povidone-iodine Anesthesia:    Anesthesia method:  Local infiltration and topical application   Topical anesthetic:  LET   Local anesthetic:  Lidocaine 2% WITH epi Procedure type:    Complexity:  Simple Procedure details:    Incision types:  Stab incision and single straight   Incision depth:  Subcutaneous   Drainage:  Purulent   Drainage amount:  Copious Post-procedure details:    Procedure completion:  Tolerated well, no immediate complications  (including critical care time)  Medications Ordered in  UC Medications  lidocaine-EPINEPHrine-tetracaine (LET) topical gel (3 mLs Topical Given 09/05/22 1555)  ibuprofen (ADVIL) tablet 800 mg (800 mg Oral Given 09/05/22 1627)    Initial Impression / Assessment and Plan / UC Course  I have reviewed the triage vital signs and the nursing notes.  Pertinent labs & imaging results that were available during my care of the patient were reviewed by me and considered in my medical decision making (see chart for details).   I&D successfully drained abscess.  Wound care instructions provided to patient.  Will cover with doxycycline due to concerns for cellulitis secondary to infected abscess.  STD cytology pending.  HIV and RPR testing pending.  Return precautions given. Final Clinical Impressions(s) / UC Diagnoses   Final diagnoses:  Abdominal wall abscess  Cellulitis and abscess of trunk     Discharge Instructions      Change dressing at least once to twice daily or if soiled.  I would recommend keeping the area dressed with a dressing for at least the next 4 days as I expect the area will continue to drain.  Start antibiotics today and take twice daily for the next 10 days.  Return if abscess has not completely healed and resolved after completion of antibiotics.  Your lab work will result within 24 hours.  Our office will reach out to you if any treatment is needed to  review of your results.     ED Prescriptions     Medication Sig Dispense Auth. Provider   doxycycline (VIBRAMYCIN) 100 MG capsule Take 1 capsule (100 mg total) by mouth 2 (two) times daily. 20 capsule Bing Neighbors, NP   naproxen (NAPROSYN) 375 MG tablet Take 1 tablet (375 mg total) by mouth 2 (two) times daily. 20 tablet Bing Neighbors, NP      PDMP not reviewed this encounter.   Bing Neighbors, NP 09/07/22 1016

## 2022-09-05 NOTE — ED Triage Notes (Signed)
Patient here today with c/o abscess just below belly button X 1 week. Patient states that it has gotten a lot larger over the week and has become more painful. She has some redness and heat to the touch.

## 2022-09-05 NOTE — Discharge Instructions (Addendum)
Change dressing at least once to twice daily or if soiled.  I would recommend keeping the area dressed with a dressing for at least the next 4 days as I expect the area will continue to drain.  Start antibiotics today and take twice daily for the next 10 days.  Return if abscess has not completely healed and resolved after completion of antibiotics.  Your lab work will result within 24 hours.  Our office will reach out to you if any treatment is needed to review of your results.

## 2022-09-06 ENCOUNTER — Telehealth: Payer: Self-pay

## 2022-09-06 LAB — RPR: RPR Ser Ql: NONREACTIVE

## 2022-09-06 MED ORDER — METRONIDAZOLE 500 MG PO TABS: 500.0000 mg | ORAL_TABLET | Freq: Two times a day (BID) | ORAL | 0 refills | Status: AC

## 2022-09-06 NOTE — Telephone Encounter (Signed)
Per protocol, pt requires tx with metronidazole and Doxycycline.  Pt was started on Doxy yesterday for abscess.  Attempted to reach patient x1. LVM. Rx for metronidazole sent to pharmacy on file.

## 2022-09-12 LAB — CERVICOVAGINAL ANCILLARY ONLY
Bacterial Vaginitis (gardnerella): POSITIVE — AB
Candida Glabrata: NEGATIVE
Candida Vaginitis: NEGATIVE
Chlamydia: POSITIVE — AB
Comment: NEGATIVE
Comment: NEGATIVE
Comment: NEGATIVE
Comment: NEGATIVE
Comment: NEGATIVE
Comment: NORMAL
Neisseria Gonorrhea: NEGATIVE
Trichomonas: POSITIVE — AB

## 2022-11-05 ENCOUNTER — Encounter (HOSPITAL_COMMUNITY): Payer: Self-pay

## 2022-11-05 ENCOUNTER — Emergency Department (HOSPITAL_COMMUNITY)
Admission: EM | Admit: 2022-11-05 | Discharge: 2022-11-06 | Disposition: A | Discharge status: Home / Self Care | Payer: Medicaid Other | Attending: Emergency Medicine | Admitting: Emergency Medicine

## 2022-11-05 ENCOUNTER — Other Ambulatory Visit: Payer: Self-pay

## 2022-11-05 DIAGNOSIS — R220 Localized swelling, mass and lump, head: Secondary | ICD-10-CM | POA: Diagnosis present

## 2022-11-05 DIAGNOSIS — L03211 Cellulitis of face: Secondary | ICD-10-CM | POA: Diagnosis not present

## 2022-11-05 NOTE — ED Notes (Signed)
Facial swelling under right eye

## 2022-11-05 NOTE — ED Triage Notes (Signed)
Pt presents via POV c/o right sided facial pain and swelling, Reports woke up yesterday thinking it was a pimple but worsened today.

## 2022-11-06 ENCOUNTER — Emergency Department (HOSPITAL_COMMUNITY): Payer: Medicaid Other

## 2022-11-06 LAB — CBC WITH DIFFERENTIAL/PLATELET
Abs Immature Granulocytes: 0.02 10*3/uL (ref 0.00–0.07)
Basophils Absolute: 0 10*3/uL (ref 0.0–0.1)
Basophils Relative: 1 %
Eosinophils Absolute: 0.1 10*3/uL (ref 0.0–0.5)
Eosinophils Relative: 1 %
HCT: 34.2 % — ABNORMAL LOW (ref 36.0–46.0)
Hemoglobin: 10.3 g/dL — ABNORMAL LOW (ref 12.0–15.0)
Immature Granulocytes: 0 %
Lymphocytes Relative: 28 %
Lymphs Abs: 2.2 10*3/uL (ref 0.7–4.0)
MCH: 24.2 pg — ABNORMAL LOW (ref 26.0–34.0)
MCHC: 30.1 g/dL (ref 30.0–36.0)
MCV: 80.3 fL (ref 80.0–100.0)
Monocytes Absolute: 0.7 10*3/uL (ref 0.1–1.0)
Monocytes Relative: 8 %
Neutro Abs: 5 10*3/uL (ref 1.7–7.7)
Neutrophils Relative %: 62 %
Platelets: 219 10*3/uL (ref 150–400)
RBC: 4.26 MIL/uL (ref 3.87–5.11)
RDW: 17 % — ABNORMAL HIGH (ref 11.5–15.5)
WBC: 8 10*3/uL (ref 4.0–10.5)
nRBC: 0 % (ref 0.0–0.2)

## 2022-11-06 LAB — BASIC METABOLIC PANEL
Anion gap: 7 (ref 5–15)
BUN: 13 mg/dL (ref 6–20)
CO2: 26 mmol/L (ref 22–32)
Calcium: 8.9 mg/dL (ref 8.9–10.3)
Chloride: 106 mmol/L (ref 98–111)
Creatinine, Ser: 0.53 mg/dL (ref 0.44–1.00)
GFR, Estimated: >60 mL/min (ref >60)
Glucose, Bld: 75 mg/dL (ref 70–99)
Potassium: 3.5 mmol/L (ref 3.5–5.1)
Sodium: 139 mmol/L (ref 135–145)

## 2022-11-06 LAB — HCG, SERUM, QUALITATIVE: Preg, Serum: NEGATIVE

## 2022-11-06 MED ORDER — IOHEXOL 300 MG/ML  SOLN
80.0000 mL | Freq: Once | INTRAMUSCULAR | Status: AC | PRN
  Administered 2022-11-06: 80 mL via INTRAVENOUS

## 2022-11-06 MED ORDER — SULFAMETHOXAZOLE-TRIMETHOPRIM 800-160 MG PO TABS
1.0000 | ORAL_TABLET | Freq: Two times a day (BID) | ORAL | 0 refills | Status: AC
Start: 2022-11-06 — End: 2022-11-13

## 2022-11-06 NOTE — Discharge Instructions (Addendum)
Please take the antibiotics as prescribed.  If you develop any life-threatening symptoms or signs of worsening infection please return to the emergency department.

## 2022-11-06 NOTE — ED Provider Notes (Signed)
[place] EMERGENCY DEPARTMENT AT [facility] Provider Note   CSN: [id number] Arrival date & time: 11/05/22  1957     History  Chief Complaint  Patient presents with   Facial Swelling    Cindy Pearson is a 27 y.o. female.  Patient presents the emergency room complaining of right-sided facial pain and swelling which was noted yesterday morning.  She denies any fevers, nausea, vomiting, systemic symptoms.  Past medical history significant for asthma, herpes, history of chlamydia  HPI     Home Medications Prior to Admission medications   Medication Sig Start Date End Date Taking? Authorizing Provider  sulfamethoxazole-trimethoprim (BACTRIM DS) 800-160 MG tablet Take 1 tablet by mouth 2 (two) times daily for 7 days. 11/06/22 11/13/22 Yes Darrick Grinder, PA-C  acetaminophen (TYLENOL) 500 MG tablet Take 2 tablets (1,000 mg total) by mouth every 8 (eight) hours. 01/15/20   Alric Seton, MD  Blood Pressure Monitor KIT 1 Device by Does not apply route once a week. To be monitored Regularly at home. 07/29/19   Brock Bad, MD  coconut oil OIL Apply 1 application topically as needed. 01/15/20   Alric Seton, MD  cyclobenzaprine (FLEXERIL) 10 MG tablet Take 1 tablet (10 mg total) by mouth 2 (two) times daily as needed for muscle spasms. 03/14/20   Brock Bad, MD  doxycycline (VIBRAMYCIN) 100 MG capsule Take 1 capsule (100 mg total) by mouth 2 (two) times daily. 09/05/22   Bing Neighbors, NP  ferrous sulfate 325 (65 FE) MG tablet Take 1 tablet (325 mg total) by mouth daily. Patient not taking: Reported on 12/30/2019 09/25/19   Rolm Bookbinder, CNM  fluticasone [facility]) 50 MCG/ACT nasal spray Place 2 sprays into both nostrils in the morning and at bedtime. 08/15/20   Rhys Martini, PA-C  ibuprofen (ADVIL) 800 MG tablet Take 1 tablet (800 mg total) by mouth every 8 (eight) hours as needed. 03/14/20   Brock Bad, MD  naproxen (NAPROSYN) 375 MG tablet  Take 1 tablet (375 mg total) by mouth 2 (two) times daily. 09/05/22   Bing Neighbors, NP  NIFEdipine (PROCARDIA XL) 30 MG 24 hr tablet Take 1 tablet (30 mg total) by mouth daily. 01/20/20   Gerrit Heck, CNM  Prenatal Vit-Fe Phos-FA-Omega (VITAFOL GUMMIES) 3.33-0.333-34.8 MG CHEW Chew 3 tablets by mouth daily before breakfast. 12/22/19   Brock Bad, MD      Allergies    Penicillins    Review of Systems   Review of Systems  Physical Exam Updated Vital Signs BP 116/75 (BP Location: Left Arm)   Pulse 94   Temp 98.1 F (36.7 C) (Oral)   Resp 16   Ht 5' (1.524 m)   Wt 59 kg   SpO2 99%   BMI 25.39 kg/m  Physical Exam Vitals and nursing note reviewed.  HENT:     Head: Normocephalic and atraumatic. No right periorbital erythema.   Eyes:     Extraocular Movements: Extraocular movements intact.     Right eye: Normal extraocular motion.     Left eye: Normal extraocular motion.     Pupils: Pupils are equal, round, and reactive to light.     Comments: No proptosis, no pain with extraocular movements  Pulmonary:     Effort: Pulmonary effort is normal. No respiratory distress.  Musculoskeletal:        General: No signs of injury.     Cervical back: Normal range of  motion.  Skin:    General: Skin is dry.  Neurological:     Mental Status: She is alert.  Psychiatric:        Speech: Speech normal.        Behavior: Behavior normal.     ED Results / Procedures / Treatments   Labs (all labs ordered are listed, but only abnormal results are displayed) Labs Reviewed  CBC WITH DIFFERENTIAL/PLATELET - Abnormal; Notable for the following components:      Result Value   Hemoglobin 10.3 (*)    HCT 34.2 (*)    MCH 24.2 (*)    RDW 17.0 (*)    All other components within normal limits  BASIC METABOLIC PANEL  HCG, SERUM, QUALITATIVE    EKG None  Radiology CT Maxillofacial W Contrast  Result Date: 11/06/2022 CLINICAL DATA:  Right infraorbital pain EXAM: CT MAXILLOFACIAL  WITH CONTRAST TECHNIQUE: Multidetector CT imaging of the maxillofacial structures was performed with intravenous contrast. Multiplanar CT image reconstructions were also generated. RADIATION DOSE REDUCTION: This exam was performed according to the departmental dose-optimization program which includes automated exposure control, adjustment of the mA and/or kV according to patient size and/or use of iterative reconstruction technique. CONTRAST:  80mL OMNIPAQUE IOHEXOL 300 MG/ML  SOLN COMPARISON:  None Available. FINDINGS: Osseous: No fracture or mandibular dislocation. No destructive process. Orbits: There is moderate right infraorbital subcutaneous inflammatory change. No fluid collection. The orbital structures themselves are normal. There is no intraorbital abnormality. Sinuses: Clear Soft tissues: Infraorbital subcutaneous inflammatory change as above. Limited intracranial: Normal IMPRESSION: Moderate right infraorbital subcutaneous inflammatory change, consistent with cellulitis. No fluid collection. No intraorbital involvement Electronically Signed   By: Deatra Robinson M.D.   On: 11/06/2022 02:35    Procedures Procedures    Medications Ordered in ED Medications  iohexol (OMNIPAQUE) 300 MG/ML solution 80 mL (80 mLs Intravenous Contrast Given 11/06/22 0113)    ED Course/ Medical Decision Making/ A&P                                 Medical Decision Making Amount and/or Complexity of Data Reviewed Labs: ordered. Radiology: ordered.  Risk Prescription drug management.   This patient presents to the ED for concern of facial infection, this involves an extensive number of treatment options, and is a complaint that carries with it a high risk of complications and morbidity.  The differential diagnosis includes cellulitis, abscess, preseptal cellulitis, orbital cellulitis, others   Co morbidities that complicate the patient evaluation  Asthma   Lab Tests:  I Ordered, and personally  interpreted labs.  The pertinent results include: Hemoglobin 10.3, negative pregnancy test, unremarkable BMP   Imaging Studies ordered:  I ordered imaging studies including CT maxillofacial with contrast I independently visualized and interpreted imaging which showed  Moderate right infraorbital subcutaneous inflammatory change,  consistent with cellulitis. No fluid collection. No intraorbital  involvement   I agree with the radiologist interpretation   Social Determinants of Health:  Patient has Medicaid for her primary health insurance type.   Test / Admission - Considered:  Patient with facial cellulitis with no orbital involvement. Plan to discharge home with prescription for Bactrim DS. Return precautions provided.          Final Clinical Impression(s) / ED Diagnoses Final diagnoses:  Facial cellulitis    Rx / DC Orders ED Discharge Orders          Ordered  sulfamethoxazole-trimethoprim (BACTRIM DS) 800-160 MG tablet  2 times daily        11/06/22 0252              Pamala Duffel 11/06/22 4401    Palumbo, April, MD 11/06/22 0502

## 2023-02-14 ENCOUNTER — Ambulatory Visit
Admission: RE | Admit: 2023-02-14 | Discharge: 2023-02-14 | Disposition: A | Discharge status: Home / Self Care | Payer: Medicaid Other | Source: Ambulatory Visit | Attending: Family Medicine

## 2023-02-14 VITALS — BP 113/70 | HR 84 | Temp 98.2°F | Resp 16

## 2023-02-14 DIAGNOSIS — Z202 Contact with and (suspected) exposure to infections with a predominantly sexual mode of transmission: Secondary | ICD-10-CM | POA: Diagnosis present

## 2023-02-14 DIAGNOSIS — R300 Dysuria: Secondary | ICD-10-CM

## 2023-02-14 LAB — POCT URINALYSIS DIP (MANUAL ENTRY)
Bilirubin, UA: NEGATIVE
Blood, UA: NEGATIVE
Glucose, UA: NEGATIVE mg/dL
Leukocytes, UA: NEGATIVE
Nitrite, UA: NEGATIVE
Protein Ur, POC: NEGATIVE mg/dL
Spec Grav, UA: 1.025 (ref 1.010–1.025)
Urobilinogen, UA: 0.2 U/dL
pH, UA: 6 (ref 5.0–8.0)

## 2023-02-14 LAB — POCT URINE PREGNANCY: Preg Test, Ur: NEGATIVE

## 2023-02-14 MED ORDER — CEFTRIAXONE SODIUM 500 MG IJ SOLR
500.0000 mg | INTRAMUSCULAR | Status: DC
  Administered 2023-02-14: 500 mg via INTRAMUSCULAR

## 2023-02-14 MED ORDER — DOXYCYCLINE HYCLATE 100 MG PO CAPS
100.0000 mg | ORAL_CAPSULE | Freq: Two times a day (BID) | ORAL | 0 refills | Status: AC
Start: 2023-02-14 — End: 2023-02-21

## 2023-02-14 MED ORDER — METRONIDAZOLE 500 MG PO TABS
500.0000 mg | ORAL_TABLET | Freq: Two times a day (BID) | ORAL | 0 refills | Status: AC
Start: 2023-02-14 — End: ?

## 2023-02-14 MED ORDER — FLUCONAZOLE 150 MG PO TABS
150.0000 mg | ORAL_TABLET | Freq: Every day | ORAL | 0 refills | Status: AC
Start: 2023-02-14 — End: 2023-02-16

## 2023-02-14 NOTE — ED Provider Notes (Signed)
 [facility]    CSN: [id number] Arrival date & time: 02/14/23  1302      History   Chief Complaint Chief Complaint  Patient presents with   Exposure to STD    Entered by patient    HPI Cindy Pearson is a 28 y.o. female presents for STD testing.  Patient reports she has been exposed to gonorrhea, chlamydia, and trichomonas.  She currently denies any vaginal discharge.  Does endorse some dysuria.  No fevers, nausea/vomiting, flank pain.  She has been treated for gonorrhea in the past.  No other concerns at this time.   Exposure to STD    Past Medical History:  Diagnosis Date   Asthma    Albuterol  INH prn   Blood transfusion without reported diagnosis    Chlamydia 2012   hasn't picked up rx, not treated yet   Herpes    last outbreak 3 months ago   History of postpartum hemorrhage    Preeclampsia in postpartum period    Patient reported   Preterm labor 2011    Patient Active Problem List   Diagnosis Date Noted   Cesarean delivery delivered 01/13/2020   History of pre-eclampsia 10/11/2019   LGSIL on Pap smear of cervix 10/11/2019   Supervision of normal pregnancy, antepartum 07/29/2019   Supervision of other normal pregnancy, antepartum 09/03/2017   Chlamydia 06/13/2016   S/P cesarean section 05/26/2016   IUGR (intrauterine growth restriction) affecting care of mother 05/17/2016    Past Surgical History:  Procedure Laterality Date   CESAREAN SECTION     CESAREAN SECTION N/A 05/26/2016   Procedure: CESAREAN SECTION;  Surgeon: Jayne Vonn DEL, MD;  Location: [facility] BIRTHING SUITES;  Service: Obstetrics;  Laterality: N/A;   CESAREAN SECTION N/A 02/05/2018   Procedure: CESAREAN SECTION;  Surgeon: Nicholaus Burnard CHRISTELLA, MD;  Location: [facility] BIRTHING SUITES;  Service: Obstetrics;  Laterality: N/A;   CESAREAN SECTION N/A 01/13/2020   Procedure: CESAREAN SECTION;  Surgeon: Fredirick Glenys RAMAN, MD;  Location: [facility] LD ORS;  Service: Obstetrics;  Laterality: N/A;   TONSILLECTOMY AND  ADENOIDECTOMY      OB History     Gravida  6   Para  5   Term  5   Preterm  0   AB  1   Living  5      SAB  1   IAB  0   Ectopic  0   Multiple  0   Live Births  5            Home Medications    Prior to Admission medications   Medication Sig Start Date End Date Taking? Authorizing Provider  doxycycline  (VIBRAMYCIN ) 100 MG capsule Take 1 capsule (100 mg total) by mouth 2 (two) times daily for 7 days. 02/14/23 02/21/23 Yes Michaelanthony Kempton, Jodi R, NP  fluconazole  (DIFLUCAN ) 150 MG tablet Take 1 tablet (150 mg total) by mouth daily for 2 doses. Take one tablet on day one and then one tablet on day 4 of antibiotics. 02/14/23 02/16/23 Yes Kamon Fahr, Jodi R, NP  metroNIDAZOLE  (FLAGYL ) 500 MG tablet Take 1 tablet (500 mg total) by mouth 2 (two) times daily. 02/14/23  Yes Cammi Consalvo, Jodi R, NP  acetaminophen  (TYLENOL ) 500 MG tablet Take 2 tablets (1,000 mg total) by mouth every 8 (eight) hours. 01/15/20   Firestone, Alicia C, MD  Blood Pressure Monitor KIT 1 Device by Does not apply route once a week. To be monitored Regularly at home. 07/29/19  Rudy Carlin LABOR, MD  coconut oil OIL Apply 1 application topically as needed. 01/15/20   Firestone, Alicia C, MD  cyclobenzaprine  (FLEXERIL ) 10 MG tablet Take 1 tablet (10 mg total) by mouth 2 (two) times daily as needed for muscle spasms. 03/14/20   Rudy Carlin LABOR, MD  ferrous sulfate  325 (65 FE) MG tablet Take 1 tablet (325 mg total) by mouth daily. Patient not taking: Reported on 12/30/2019 09/25/19   Marylen Aleck HERO, CNM  fluticasone  (FLONASE ) 50 MCG/ACT nasal spray Place 2 sprays into both nostrils in the morning and at bedtime. 08/15/20   Graham, Laura E, PA-C  ibuprofen  (ADVIL ) 800 MG tablet Take 1 tablet (800 mg total) by mouth every 8 (eight) hours as needed. 03/14/20   Rudy Carlin LABOR, MD  naproxen  (NAPROSYN ) 375 MG tablet Take 1 tablet (375 mg total) by mouth 2 (two) times daily. 09/05/22   Arloa Suzen RAMAN, NP  NIFEdipine  (PROCARDIA  XL) 30 MG 24 hr  tablet Take 1 tablet (30 mg total) by mouth daily. 01/20/20   Synthia Raisin, CNM  Prenatal Vit-Fe Phos-FA-Omega (VITAFOL  GUMMIES) 3.33-0.333-34.8 MG CHEW Chew 3 tablets by mouth daily before breakfast. 12/22/19   Rudy Carlin LABOR, MD    Family History Family History  Problem Relation Age of Onset   Diabetes Maternal Grandmother    Heart disease Maternal Grandfather    Cancer Maternal Grandfather    Heart disease Paternal Grandfather    Cancer Father     Social History Social History   Tobacco Use   Smoking status: Some Days    Types: Cigars    Last attempt to quit: 05/19/2019    Years since quitting: 3.7   Smokeless tobacco: Never   Tobacco comments:    started again 02/11/2018  Vaping Use   Vaping status: Former  Substance Use Topics   Alcohol use: Not Currently    Comment: occasional   Drug use: Not Currently    Types: Marijuana    Comment: last smoked in January 2022     Allergies   Penicillins   Review of Systems Review of Systems  Genitourinary:  Positive for dysuria.       STD screening     Physical Exam Triage Vital Signs ED Triage Vitals  Encounter Vitals Group     BP 02/14/23 1341 113/70     Systolic BP Percentile --      Diastolic BP Percentile --      Pulse Rate 02/14/23 1341 84     Resp 02/14/23 1341 16     Temp 02/14/23 1341 98.2 F (36.8 C)     Temp Source 02/14/23 1341 Oral     SpO2 02/14/23 1341 99 %     Weight --      Height --      Head Circumference --      Peak Flow --      Pain Score 02/14/23 1348 0     Pain Loc --      Pain Education --      Exclude from Growth Chart --    No data found.  Updated Vital Signs BP 113/70 (BP Location: Left Arm)   Pulse 84   Temp 98.2 F (36.8 C) (Oral)   Resp 16   LMP 01/14/2023 (Exact Date)   SpO2 99%   Visual Acuity Right Eye Distance:   Left Eye Distance:   Bilateral Distance:    Right Eye Near:   Left Eye Near:  Bilateral Near:     Physical Exam Vitals and nursing note  reviewed.  Constitutional:      General: She is not in acute distress.    Appearance: Normal appearance. She is not ill-appearing.  HENT:     Head: Normocephalic and atraumatic.  Eyes:     Pupils: Pupils are equal, round, and reactive to light.  Cardiovascular:     Rate and Rhythm: Normal rate.  Pulmonary:     Effort: Pulmonary effort is normal.  Abdominal:     Tenderness: There is no right CVA tenderness or left CVA tenderness.  Skin:    General: Skin is warm and dry.  Neurological:     General: No focal deficit present.     Mental Status: She is alert and oriented to person, place, and time.  Psychiatric:        Mood and Affect: Mood normal.        Behavior: Behavior normal.      UC Treatments / Results  Labs (all labs ordered are listed, but only abnormal results are displayed) Labs Reviewed  POCT URINALYSIS DIP (MANUAL ENTRY) - Abnormal; Notable for the following components:      Result Value   Ketones, POC UA trace (5) (*)    All other components within normal limits  POCT URINE PREGNANCY  CERVICOVAGINAL ANCILLARY ONLY    EKG   Radiology No results found.  Procedures Procedures (including critical care time)  Medications Ordered in UC Medications  cefTRIAXone  (ROCEPHIN ) injection 500 mg (500 mg Intramuscular Given 02/14/23 1414)    Initial Impression / Assessment and Plan / UC Course  I have reviewed the triage vital signs and the nursing notes.  Pertinent labs & imaging results that were available during my care of the patient were reviewed by me and considered in my medical decision making (see chart for details).     STD testing is ordered we will contact for any positive results.  UA and hCG negative.  Given known exposure to gonorrhea, chlamydia, trichomonas will treat with ceftriaxone  injection in clinic, doxycycline , and metronidazole .  Diflucan  as well for likely antibiotic induced yeast infection given treatment.advised GYN or PCP follow up as  needed. ER precautions reviewed and pt verbalized understanding Final Clinical Impressions(s) / UC Diagnoses   Final diagnoses:  Exposure to gonorrhea  Dysuria  Exposure to trichomonas  Exposure to chlamydia     Discharge Instructions      You were given an injection of ceftriaxone  in the clinic for your exposure to gonorrhea. Start doxycycline  given your exposure to chlamydia and metronidazole  for your exposure to trichomonas.  Take Diflucan  as prescribed.  The clinic will contact you with results of the STD testing done today if positive.  Follow-up with your PCP or GYN as needed. Please go to the ER for any worsening symptoms.      ED Prescriptions     Medication Sig Dispense Auth. Provider   doxycycline  (VIBRAMYCIN ) 100 MG capsule Take 1 capsule (100 mg total) by mouth 2 (two) times daily for 7 days. 14 capsule Jerame Hedding, Jodi R, NP   metroNIDAZOLE  (FLAGYL ) 500 MG tablet Take 1 tablet (500 mg total) by mouth 2 (two) times daily. 14 tablet Chareese Sergent, Jodi R, NP   fluconazole  (DIFLUCAN ) 150 MG tablet Take 1 tablet (150 mg total) by mouth daily for 2 doses. Take one tablet on day one and then one tablet on day 4 of antibiotics. 2 tablet Myldred Raju, Jodi R, NP  PDMP not reviewed this encounter.   Loreda Myla SAUNDERS, NP 02/14/23 [phone]

## 2023-02-14 NOTE — Discharge Instructions (Addendum)
 You were given an injection of ceftriaxone  in the clinic for your exposure to gonorrhea. Start doxycycline  given your exposure to chlamydia and metronidazole  for your exposure to trichomonas.  Take Diflucan  as prescribed.  The clinic will contact you with results of the STD testing done today if positive.  Follow-up with your PCP or GYN as needed. Please go to the ER for any worsening symptoms.

## 2023-02-14 NOTE — ED Triage Notes (Signed)
 Pt presents for std testing. Pt reports no sxs. Reports abd pressure.

## 2023-02-17 ENCOUNTER — Telehealth (HOSPITAL_COMMUNITY): Payer: Self-pay

## 2023-02-17 LAB — CERVICOVAGINAL ANCILLARY ONLY
Bacterial Vaginitis (gardnerella): POSITIVE — AB
Candida Glabrata: NEGATIVE
Candida Vaginitis: POSITIVE — AB
Chlamydia: POSITIVE — AB
Comment: NEGATIVE
Comment: NEGATIVE
Comment: NEGATIVE
Comment: NEGATIVE
Comment: NEGATIVE
Comment: NORMAL
Neisseria Gonorrhea: POSITIVE — AB
Trichomonas: POSITIVE — AB

## 2023-02-17 MED ORDER — FLUCONAZOLE 150 MG PO TABS: 150.0000 mg | ORAL_TABLET | Freq: Once | ORAL | 0 refills | Status: AC

## 2023-02-17 NOTE — Telephone Encounter (Signed)
 Per protocol, pt requires tx with Diflucan.  Rx sent to pharmacy on file.

## 2023-03-03 ENCOUNTER — Ambulatory Visit
Admission: EM | Admit: 2023-03-03 | Discharge: 2023-03-03 | Disposition: A | Discharge status: Home / Self Care | Payer: Medicaid Other | Attending: Family Medicine | Admitting: Family Medicine

## 2023-03-03 DIAGNOSIS — B9789 Other viral agents as the cause of diseases classified elsewhere: Secondary | ICD-10-CM | POA: Insufficient documentation

## 2023-03-03 DIAGNOSIS — J988 Other specified respiratory disorders: Secondary | ICD-10-CM | POA: Diagnosis present

## 2023-03-03 DIAGNOSIS — Z113 Encounter for screening for infections with a predominantly sexual mode of transmission: Secondary | ICD-10-CM | POA: Diagnosis present

## 2023-03-03 MED ORDER — PSEUDOEPHEDRINE HCL 60 MG PO TABS
60.0000 mg | ORAL_TABLET | Freq: Three times a day (TID) | ORAL | 0 refills | Status: AC | PRN
Start: 2023-03-03 — End: ?

## 2023-03-03 MED ORDER — PROMETHAZINE-DM 6.25-15 MG/5ML PO SYRP: 5.0000 mL | ORAL_SOLUTION | Freq: Three times a day (TID) | ORAL | 0 refills | Status: AC | PRN

## 2023-03-03 MED ORDER — IBUPROFEN 600 MG PO TABS: 600.0000 mg | ORAL_TABLET | Freq: Four times a day (QID) | ORAL | 0 refills | Status: AC | PRN

## 2023-03-03 MED ORDER — CETIRIZINE HCL 10 MG PO TABS: 10.0000 mg | ORAL_TABLET | Freq: Every day | ORAL | 0 refills | Status: AC

## 2023-03-03 MED ORDER — ALBUTEROL SULFATE HFA 108 (90 BASE) MCG/ACT IN AERS
1.0000 | INHALATION_SPRAY | Freq: Four times a day (QID) | RESPIRATORY_TRACT | 0 refills | Status: AC | PRN
Start: 2023-03-03 — End: ?

## 2023-03-03 NOTE — Discharge Instructions (Signed)
 We will manage this as a viral illness. For sore throat or cough try using a honey-based tea. Use 3 teaspoons of honey with juice squeezed from half lemon. Place shaved pieces of ginger into 1/2-1 cup of water and warm over stove top. Then mix the ingredients and repeat every 4 hours as needed. Please take ibuprofen 600mg  every 6 hours with food alternating with OR taken together with Tylenol 500mg -650mg  every 6 hours for throat pain, fevers, aches and pains. Hydrate very well with at least 2 liters of water. Eat light meals such as soups (chicken and noodles, vegetable, chicken and wild rice).  Do not eat foods that you are allergic to.  Taking an antihistamine like Zyrtec (10mg  daily) can help against postnasal drainage, sinus congestion which can cause sinus pain, sinus headaches, throat pain, painful swallowing, coughing.  You can take this together with pseudoephedrine (Sudafed) at a dose of 60 mg 3 times a day or twice daily as needed for the same kind of nasal drip, congestion.  Use albuterol inhaler and cough syrup as needed.

## 2023-03-03 NOTE — ED Provider Notes (Signed)
 [facility]  Note:  This document was prepared using Conservation officer, historic buildings and may include unintentional dictation errors.  MRN: [id number] DOB: May 24, 1995  Subjective:   Cindy Pearson is a 28 y.o. female presenting for 3-day history of coughing, chest congestion, wheezing.  Has had sinus drainage as well as congestion.  Needs a refill of her albuterol.  Does not feel she needs steroids.  Has had 1 sick contact with her daughter who presents to the clinic today.  Would like an STI recheck.  Was pan positive for her vaginal cytology 02/14/2023.  Completed all treatments.  Denies fever, n/v, abdominal pain, pelvic pain, rashes, dysuria, urinary frequency, hematuria, vaginal discharge.    No current facility-administered medications for this encounter.  Current Outpatient Medications:    acetaminophen (TYLENOL) 500 MG tablet, Take 2 tablets (1,000 mg total) by mouth every 8 (eight) hours., Disp: 30 tablet, Rfl: 0   Blood Pressure Monitor KIT, 1 Device by Does not apply route once a week. To be monitored Regularly at home., Disp: 1 kit, Rfl: 0   coconut oil OIL, Apply 1 application topically as needed., Disp: , Rfl: 0   cyclobenzaprine (FLEXERIL) 10 MG tablet, Take 1 tablet (10 mg total) by mouth 2 (two) times daily as needed for muscle spasms., Disp: 20 tablet, Rfl: 1   ferrous sulfate 325 (65 FE) MG tablet, Take 1 tablet (325 mg total) by mouth daily. (Patient not taking: Reported on 12/30/2019), Disp: 30 tablet, Rfl: 0   fluticasone (FLONASE) 50 MCG/ACT nasal spray, Place 2 sprays into both nostrils in the morning and at bedtime., Disp: 15 mL, Rfl: 2   ibuprofen (ADVIL) 800 MG tablet, Take 1 tablet (800 mg total) by mouth every 8 (eight) hours as needed., Disp: 30 tablet, Rfl: 5   metroNIDAZOLE (FLAGYL) 500 MG tablet, Take 1 tablet (500 mg total) by mouth 2 (two) times daily., Disp: 14 tablet, Rfl: 0   naproxen (NAPROSYN) 375 MG tablet, Take 1 tablet (375 mg  total) by mouth 2 (two) times daily., Disp: 20 tablet, Rfl: 0   NIFEdipine (PROCARDIA XL) 30 MG 24 hr tablet, Take 1 tablet (30 mg total) by mouth daily., Disp: 30 tablet, Rfl: 1   Prenatal Vit-Fe Phos-FA-Omega (VITAFOL GUMMIES) 3.33-0.333-34.8 MG CHEW, Chew 3 tablets by mouth daily before breakfast., Disp: 90 tablet, Rfl: 11   Allergies  Allergen Reactions   Penicillins Hives    Patient states that she is allergic to all penicillins. Has patient had a PCN reaction causing immediate rash, facial/tongue/throat swelling, SOB or lightheadedness with hypotension: YES Has patient had a PCN reaction causing severe rash involving mucus membranes or skin necrosis: NO Has patient had a PCN reaction that required hospitalization NO Has patient had a PCN reaction occurring within the last 10 years:NO If all of the above answers are "NO", then may proceed with Cephalosporin use.    Past Medical History:  Diagnosis Date   Asthma    Albuterol INH prn   Blood transfusion without reported diagnosis    Chlamydia 2012   hasn't picked up rx, not treated yet   Herpes    last outbreak 3 months ago   History of postpartum hemorrhage    Preeclampsia in postpartum period    Patient reported   Preterm labor 2011     Past Surgical History:  Procedure Laterality Date   CESAREAN SECTION     CESAREAN SECTION N/A 05/26/2016   Procedure: CESAREAN SECTION;  Surgeon: Lazaro Arms, MD;  Location: [facility] BIRTHING SUITES;  Service: Obstetrics;  Laterality: N/A;   CESAREAN SECTION N/A 02/05/2018   Procedure: CESAREAN SECTION;  Surgeon: Conan Bowens, MD;  Location: [facility] BIRTHING SUITES;  Service: Obstetrics;  Laterality: N/A;   CESAREAN SECTION N/A 01/13/2020   Procedure: CESAREAN SECTION;  Surgeon: Reva Bores, MD;  Location: [facility] LD ORS;  Service: Obstetrics;  Laterality: N/A;   TONSILLECTOMY AND ADENOIDECTOMY      Family History  Problem Relation Age of Onset   Diabetes Maternal Grandmother    Heart disease  Maternal Grandfather    Cancer Maternal Grandfather    Heart disease Paternal Grandfather    Cancer Father     Social History   Tobacco Use   Smoking status: Every Day    Types: Cigars   Smokeless tobacco: Never   Tobacco comments:    started again 02/11/2018  Vaping Use   Vaping status: Some Days  Substance Use Topics   Alcohol use: Yes    Comment: occasional   Drug use: Yes    Types: Marijuana    ROS   Objective:   Vitals: BP 109/72 (BP Location: Left Arm)   Pulse 85   Temp 98.2 F (36.8 C) (Oral)   Resp 16   LMP 02/17/2023   SpO2 97%   Physical Exam Constitutional:      General: She is not in acute distress.    Appearance: Normal appearance. She is well-developed and normal weight. She is not ill-appearing, toxic-appearing or diaphoretic.  HENT:     Head: Normocephalic and atraumatic.     Right Ear: Tympanic membrane, ear canal and external ear normal. No drainage or tenderness. No middle ear effusion. There is no impacted cerumen. Tympanic membrane is not erythematous or bulging.     Left Ear: Tympanic membrane, ear canal and external ear normal. No drainage or tenderness.  No middle ear effusion. There is no impacted cerumen. Tympanic membrane is not erythematous or bulging.     Nose: Nose normal. No congestion or rhinorrhea.     Mouth/Throat:     Mouth: Mucous membranes are moist. No oral lesions.     Pharynx: No pharyngeal swelling, oropharyngeal exudate, posterior oropharyngeal erythema or uvula swelling.     Tonsils: No tonsillar exudate or tonsillar abscesses.  Eyes:     General: No scleral icterus.       Right eye: No discharge.        Left eye: No discharge.     Extraocular Movements: Extraocular movements intact.     Right eye: Normal extraocular motion.     Left eye: Normal extraocular motion.     Conjunctiva/sclera: Conjunctivae normal.  Cardiovascular:     Rate and Rhythm: Normal rate and regular rhythm.     Heart sounds: Normal heart sounds.  No murmur heard.    No friction rub. No gallop.  Pulmonary:     Effort: Pulmonary effort is normal. No respiratory distress.     Breath sounds: No stridor. No wheezing, rhonchi or rales.  Chest:     Chest wall: No tenderness.  Musculoskeletal:     Cervical back: Normal range of motion and neck supple.  Lymphadenopathy:     Cervical: No cervical adenopathy.  Skin:    General: Skin is warm and dry.  Neurological:     General: No focal deficit present.     Mental Status: She is alert and oriented to person, place, and time.  Psychiatric:        Mood and Affect: Mood normal.        Behavior: Behavior normal.     Assessment and Plan :   PDMP not reviewed this encounter.  1. Viral respiratory infection   2. Screen for STD (sexually transmitted disease)    Deferred imaging given clear cardiopulmonary exam, hemodynamically stable vital signs.  Refilled her albuterol. Suspect viral URI, viral syndrome. Physical exam findings reassuring and vital signs stable for discharge. Advised supportive care, offered symptomatic relief. Counseled patient on potential for adverse effects with medications prescribed/recommended today, ER and return-to-clinic precautions discussed, patient verbalized understanding.    Otherwise, will treat as appropriate based off of her vaginal cytology.   Wallis Bamberg, [place] 03/03/23 1759

## 2023-03-03 NOTE — ED Triage Notes (Signed)
 Pt c/o cough and chest congestion x 3 days-denies fever-also requesting STD testing-NAD-steady gait

## 2023-03-04 LAB — CERVICOVAGINAL ANCILLARY ONLY
Bacterial Vaginitis (gardnerella): NEGATIVE
Candida Glabrata: POSITIVE — AB
Candida Vaginitis: NEGATIVE
Chlamydia: NEGATIVE
Comment: NEGATIVE
Comment: NEGATIVE
Comment: NEGATIVE
Comment: NEGATIVE
Comment: NEGATIVE
Comment: NORMAL
Neisseria Gonorrhea: NEGATIVE
Trichomonas: NEGATIVE
